# Patient Record
Sex: Female | Born: 1940
Health system: Southern US, Community
[De-identification: ages and names within clinical notes are randomized; demographics above are authoritative.]

## PROBLEM LIST (undated history)

## (undated) DIAGNOSIS — R42 Dizziness and giddiness: Secondary | ICD-10-CM

## (undated) DIAGNOSIS — K219 Gastro-esophageal reflux disease without esophagitis: Secondary | ICD-10-CM

## (undated) DIAGNOSIS — E049 Nontoxic goiter, unspecified: Secondary | ICD-10-CM

## (undated) DIAGNOSIS — T56891A Toxic effect of other metals, accidental (unintentional), initial encounter: Secondary | ICD-10-CM

## (undated) DIAGNOSIS — E785 Hyperlipidemia, unspecified: Secondary | ICD-10-CM

## (undated) DIAGNOSIS — N182 Chronic kidney disease, stage 2 (mild): Secondary | ICD-10-CM

## (undated) DIAGNOSIS — K469 Unspecified abdominal hernia without obstruction or gangrene: Secondary | ICD-10-CM

## (undated) DIAGNOSIS — E039 Hypothyroidism, unspecified: Secondary | ICD-10-CM

## (undated) DIAGNOSIS — Z86010 Personal history of colonic polyps: Secondary | ICD-10-CM

## (undated) DIAGNOSIS — Q211 Atrial septal defect: Secondary | ICD-10-CM

## (undated) DIAGNOSIS — N390 Urinary tract infection, site not specified: Secondary | ICD-10-CM

## (undated) DIAGNOSIS — E038 Other specified hypothyroidism: Secondary | ICD-10-CM

## (undated) DIAGNOSIS — M1990 Unspecified osteoarthritis, unspecified site: Secondary | ICD-10-CM

## (undated) DIAGNOSIS — I10 Essential (primary) hypertension: Secondary | ICD-10-CM

## (undated) DIAGNOSIS — H269 Unspecified cataract: Secondary | ICD-10-CM

## (undated) DIAGNOSIS — K529 Noninfective gastroenteritis and colitis, unspecified: Secondary | ICD-10-CM

## (undated) DIAGNOSIS — F419 Anxiety disorder, unspecified: Secondary | ICD-10-CM

## (undated) DIAGNOSIS — M4306 Spondylolysis, lumbar region: Secondary | ICD-10-CM

## (undated) DIAGNOSIS — G5603 Carpal tunnel syndrome, bilateral upper limbs: Secondary | ICD-10-CM

## (undated) DIAGNOSIS — F329 Major depressive disorder, single episode, unspecified: Secondary | ICD-10-CM

## (undated) DIAGNOSIS — M810 Age-related osteoporosis without current pathological fracture: Secondary | ICD-10-CM

## (undated) DIAGNOSIS — N183 Chronic kidney disease, stage 3 unspecified: Secondary | ICD-10-CM

## (undated) DIAGNOSIS — Q2112 Patent foramen ovale: Secondary | ICD-10-CM

## (undated) DIAGNOSIS — G459 Transient cerebral ischemic attack, unspecified: Secondary | ICD-10-CM

## (undated) DIAGNOSIS — M5136 Other intervertebral disc degeneration, lumbar region: Secondary | ICD-10-CM

## (undated) DIAGNOSIS — F32A Depression, unspecified: Secondary | ICD-10-CM

## (undated) DIAGNOSIS — E559 Vitamin D deficiency, unspecified: Secondary | ICD-10-CM

## (undated) HISTORY — PX: THYROIDECTOMY: SHX17

## (undated) HISTORY — PX: DILATION AND CURETTAGE OF UTERUS: SHX78

## (undated) HISTORY — PX: ABDOMINAL HYSTERECTOMY: SHX81

## (undated) HISTORY — DX: Toxic effect of other metals, accidental (unintentional), initial encounter: T56.891A

## (undated) HISTORY — DX: Hypercalcemia: E83.52

## (undated) HISTORY — PX: BREAST SURGERY: SHX581

## (undated) HISTORY — DX: Other specified hypothyroidism: E03.8

## (undated) HISTORY — DX: Personal history of colonic polyps: Z86.010

## (undated) HISTORY — DX: Spondylolysis, lumbar region: M43.06

## (undated) HISTORY — DX: Other intervertebral disc degeneration, lumbar region: M51.36

## (undated) HISTORY — PX: RECTOCELE REPAIR: SHX761

## (undated) HISTORY — DX: Nontoxic goiter, unspecified: E04.9

## (undated) HISTORY — PX: BREAST EXCISIONAL BIOPSY: SUR124

## (undated) HISTORY — DX: Chronic kidney disease, stage 3 unspecified: N18.30

## (undated) HISTORY — DX: Major depressive disorder, single episode, unspecified: F32.9

## (undated) HISTORY — DX: Depression, unspecified: F32.A

## (undated) HISTORY — DX: Essential (primary) hypertension: I10

## (undated) HISTORY — DX: Dizziness and giddiness: R42

## (undated) HISTORY — DX: Transient cerebral ischemic attack, unspecified: G45.9

## (undated) HISTORY — DX: Chronic kidney disease, stage 2 (mild): N18.2

## (undated) HISTORY — DX: Hyperlipidemia, unspecified: E78.5

## (undated) HISTORY — DX: Unspecified osteoarthritis, unspecified site: M19.90

## (undated) HISTORY — DX: Carpal tunnel syndrome, bilateral upper limbs: G56.03

## (undated) HISTORY — PX: COLONOSCOPY W/ POLYPECTOMY: SHX1380

## (undated) HISTORY — DX: Patent foramen ovale: Q21.12

## (undated) HISTORY — PX: BLADDER SUSPENSION: SHX72

## (undated) HISTORY — DX: Anxiety disorder, unspecified: F41.9

## (undated) HISTORY — DX: Unspecified abdominal hernia without obstruction or gangrene: K46.9

## (undated) HISTORY — DX: Hypothyroidism, unspecified: E03.9

## (undated) HISTORY — DX: Vitamin D deficiency, unspecified: E55.9

## (undated) HISTORY — DX: Age-related osteoporosis without current pathological fracture: M81.0

## (undated) HISTORY — DX: Atrial septal defect: Q21.1

---
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
## 2010-08-14 DIAGNOSIS — Z86010 Personal history of colonic polyps: Secondary | ICD-10-CM

## 2010-08-14 DIAGNOSIS — Z860101 Personal history of adenomatous and serrated colon polyps: Secondary | ICD-10-CM

## 2010-08-14 HISTORY — DX: Personal history of adenomatous and serrated colon polyps: Z86.0101

## 2010-08-14 HISTORY — DX: Personal history of colonic polyps: Z86.010

## 2011-02-03 ENCOUNTER — Emergency Department (INDEPENDENT_AMBULATORY_CARE_PROVIDER_SITE_OTHER): Payer: Medicare Other

## 2011-02-03 ENCOUNTER — Emergency Department (HOSPITAL_BASED_OUTPATIENT_CLINIC_OR_DEPARTMENT_OTHER)
Admission: EM | Admit: 2011-02-03 | Discharge: 2011-02-03 | Disposition: A | Discharge status: Home / Self Care | Payer: Medicare Other | Attending: Emergency Medicine | Admitting: Emergency Medicine

## 2011-02-03 DIAGNOSIS — I6789 Other cerebrovascular disease: Secondary | ICD-10-CM

## 2011-02-03 DIAGNOSIS — R42 Dizziness and giddiness: Secondary | ICD-10-CM | POA: Insufficient documentation

## 2011-02-03 DIAGNOSIS — I10 Essential (primary) hypertension: Secondary | ICD-10-CM

## 2011-02-03 DIAGNOSIS — G319 Degenerative disease of nervous system, unspecified: Secondary | ICD-10-CM

## 2011-02-03 DIAGNOSIS — Z79899 Other long term (current) drug therapy: Secondary | ICD-10-CM | POA: Insufficient documentation

## 2011-02-03 DIAGNOSIS — E7800 Pure hypercholesterolemia, unspecified: Secondary | ICD-10-CM | POA: Insufficient documentation

## 2011-02-03 DIAGNOSIS — H8110 Benign paroxysmal vertigo, unspecified ear: Secondary | ICD-10-CM | POA: Insufficient documentation

## 2011-02-03 LAB — COMPREHENSIVE METABOLIC PANEL
ALT: 22 U/L (ref 0–35)
AST: 20 U/L (ref 0–37)
CO2: 25 mEq/L (ref 19–32)
Calcium: 9.9 mg/dL (ref 8.4–10.5)
Creatinine, Ser: 0.8 mg/dL (ref 0.50–1.10)
GFR calc Af Amer: >60 mL/min (ref >60)
GFR calc non Af Amer: >60 mL/min (ref >60)
Sodium: 140 mEq/L (ref 135–145)
Total Protein: 7 g/dL (ref 6.0–8.3)

## 2011-02-03 LAB — URINALYSIS, ROUTINE W REFLEX MICROSCOPIC
Bilirubin Urine: NEGATIVE
Glucose, UA: NEGATIVE mg/dL
Hgb urine dipstick: NEGATIVE
Ketones, ur: NEGATIVE mg/dL
Protein, ur: NEGATIVE mg/dL
Urobilinogen, UA: 0.2 mg/dL (ref 0.0–1.0)

## 2011-02-03 LAB — DIFFERENTIAL
Basophils Relative: 0 % (ref 0–1)
Eosinophils Absolute: 0.2 10*3/uL (ref 0.0–0.7)
Eosinophils Relative: 2 % (ref 0–5)
Lymphs Abs: 1.5 10*3/uL (ref 0.7–4.0)
Monocytes Absolute: 0.5 10*3/uL (ref 0.1–1.0)
Monocytes Relative: 8 % (ref 3–12)
Neutrophils Relative %: 68 % (ref 43–77)

## 2011-02-03 LAB — CBC
MCH: 29.2 pg (ref 26.0–34.0)
MCHC: 33.6 g/dL (ref 30.0–36.0)
MCV: 87 fL (ref 78.0–100.0)
Platelets: 229 10*3/uL (ref 150–400)
RBC: 4.69 MIL/uL (ref 3.87–5.11)
RDW: 13.4 % (ref 11.5–15.5)

## 2011-02-04 LAB — URINE CULTURE
Colony Count: NO GROWTH
Culture  Setup Time: 201206221626

## 2011-02-28 ENCOUNTER — Other Ambulatory Visit (HOSPITAL_BASED_OUTPATIENT_CLINIC_OR_DEPARTMENT_OTHER): Payer: Self-pay | Admitting: Diagnostic Neuroimaging

## 2011-02-28 DIAGNOSIS — R42 Dizziness and giddiness: Secondary | ICD-10-CM

## 2011-03-01 ENCOUNTER — Inpatient Hospital Stay (HOSPITAL_BASED_OUTPATIENT_CLINIC_OR_DEPARTMENT_OTHER): Admission: RE | Admit: 2011-03-01 | Payer: Medicare Other | Source: Ambulatory Visit

## 2011-03-01 ENCOUNTER — Ambulatory Visit (HOSPITAL_BASED_OUTPATIENT_CLINIC_OR_DEPARTMENT_OTHER)
Admission: RE | Admit: 2011-03-01 | Discharge: 2011-03-01 | Disposition: A | Discharge status: Home / Self Care | Payer: Medicare Other | Source: Ambulatory Visit | Attending: Diagnostic Neuroimaging | Admitting: Diagnostic Neuroimaging

## 2011-03-01 DIAGNOSIS — I679 Cerebrovascular disease, unspecified: Secondary | ICD-10-CM | POA: Insufficient documentation

## 2011-03-01 DIAGNOSIS — R42 Dizziness and giddiness: Secondary | ICD-10-CM

## 2011-03-01 MED ORDER — GADOBENATE DIMEGLUMINE 529 MG/ML IV SOLN
14.0000 mL | Freq: Once | INTRAVENOUS | Status: AC | PRN
  Administered 2011-03-01: 14 mL via INTRAVENOUS

## 2011-03-17 ENCOUNTER — Encounter: Payer: Self-pay | Admitting: Internal Medicine

## 2011-03-17 ENCOUNTER — Ambulatory Visit (HOSPITAL_BASED_OUTPATIENT_CLINIC_OR_DEPARTMENT_OTHER)
Admission: RE | Admit: 2011-03-17 | Discharge: 2011-03-17 | Disposition: A | Discharge status: Home / Self Care | Payer: Medicare Other | Source: Ambulatory Visit | Attending: Internal Medicine | Admitting: Internal Medicine

## 2011-03-17 ENCOUNTER — Ambulatory Visit (INDEPENDENT_AMBULATORY_CARE_PROVIDER_SITE_OTHER): Payer: Medicare Other | Admitting: Internal Medicine

## 2011-03-17 DIAGNOSIS — E039 Hypothyroidism, unspecified: Secondary | ICD-10-CM

## 2011-03-17 DIAGNOSIS — Q2111 Secundum atrial septal defect: Secondary | ICD-10-CM

## 2011-03-17 DIAGNOSIS — E785 Hyperlipidemia, unspecified: Secondary | ICD-10-CM

## 2011-03-17 DIAGNOSIS — Q211 Atrial septal defect: Secondary | ICD-10-CM

## 2011-03-17 DIAGNOSIS — M810 Age-related osteoporosis without current pathological fracture: Secondary | ICD-10-CM

## 2011-03-17 DIAGNOSIS — Z1211 Encounter for screening for malignant neoplasm of colon: Secondary | ICD-10-CM

## 2011-03-17 DIAGNOSIS — E079 Disorder of thyroid, unspecified: Secondary | ICD-10-CM

## 2011-03-17 DIAGNOSIS — E041 Nontoxic single thyroid nodule: Secondary | ICD-10-CM | POA: Insufficient documentation

## 2011-03-17 LAB — LIPID PANEL
Cholesterol: 227 mg/dL — ABNORMAL HIGH (ref 0–200)
HDL: 55 mg/dL (ref >39)
Total CHOL/HDL Ratio: 4.1 Ratio
VLDL: 37 mg/dL (ref 0–40)

## 2011-03-17 NOTE — Patient Instructions (Signed)
Please schedule BMD ([id number])

## 2011-03-18 LAB — T4, FREE: Free T4: 1.05 ng/dL (ref 0.80–1.80)

## 2011-03-19 DIAGNOSIS — E785 Hyperlipidemia, unspecified: Secondary | ICD-10-CM | POA: Insufficient documentation

## 2011-03-19 DIAGNOSIS — Q211 Atrial septal defect: Secondary | ICD-10-CM | POA: Insufficient documentation

## 2011-03-19 DIAGNOSIS — E049 Nontoxic goiter, unspecified: Secondary | ICD-10-CM | POA: Insufficient documentation

## 2011-03-19 DIAGNOSIS — M810 Age-related osteoporosis without current pathological fracture: Secondary | ICD-10-CM | POA: Insufficient documentation

## 2011-03-19 NOTE — Assessment & Plan Note (Signed)
Schedule f/u bmd

## 2011-03-19 NOTE — Progress Notes (Signed)
  Subjective:    Patient ID: Carla Spencer, female    DOB: 1941-02-22, 70 y.o.   MRN: [id number]  HPI Pt presents to clinic to est care and for evaluation of multiple medical problems. H/o decreased memory, word finding and ?white matter lesions noted on cranial imaging. Currently seeing neurology for this. H/o thyroidectomy s/p +PET scan raised concern for thyroid malignancy. States pathology ultimately benign. C/o alopecia. Believes last colonoscopy ~15 years ago and is without change in bowel habit or blood in stool. Was told in 2006 had "hole in heart" and describes undergoing TEE. There was discussion about possible repair but ultimately advised against at the time. Do not believe she has had any further cardiac followup or studies. Denies dyspnea, cp or palpitations. H/o ?osteoporosis without recent fx taking medication. Last bmd >2years ago. Tolerates statin tx without myalgias or abn lfts. Seen recently in ED with vertigo, given antivert and sx's resolved. No other complaints.   Reviewed pmh, psh, medications, allergies, soc hx and fam hx.    Review of Systems  Constitutional: Negative for fatigue.  Respiratory: Negative for shortness of breath.   Cardiovascular: Negative for chest pain and palpitations.  Neurological: Negative for dizziness and weakness.  All other systems reviewed and are negative.       Objective:   Physical Exam  Nursing note and vitals reviewed. Constitutional: She appears well-developed and well-nourished. No distress.  HENT:  Head: Normocephalic and atraumatic.  Right Ear: External ear normal.  Left Ear: External ear normal.  Nose: Nose normal.  Mouth/Throat: Oropharynx is clear and moist. No oropharyngeal exudate.  Eyes: Conjunctivae and EOM are normal. Pupils are equal, round, and reactive to light. Right eye exhibits no discharge. Left eye exhibits no discharge. No scleral icterus.  Neck: Neck supple. No thyromegaly present.  Cardiovascular: Normal  rate, regular rhythm and normal heart sounds.  Exam reveals no gallop and no friction rub.   No murmur heard. Pulmonary/Chest: Effort normal and breath sounds normal. No respiratory distress. She has no wheezes. She has no rales.  Lymphadenopathy:    She has no cervical adenopathy.  Neurological: She is alert.  Skin: Skin is warm and dry. She is not diaphoretic.  Psychiatric: She has a normal mood and affect.          Assessment & Plan:

## 2011-03-19 NOTE — Assessment & Plan Note (Signed)
Obtain lipid/lft. 

## 2011-03-19 NOTE — Assessment & Plan Note (Signed)
?  asd by hx. Cardiology referral for re-evaluation

## 2011-03-19 NOTE — Assessment & Plan Note (Signed)
S/p thyroidectomy. +alopecia. Obtain tsh and free t4

## 2011-03-20 ENCOUNTER — Encounter: Payer: Self-pay | Admitting: Gastroenterology

## 2011-03-31 ENCOUNTER — Encounter: Payer: Self-pay | Admitting: Internal Medicine

## 2011-04-03 ENCOUNTER — Encounter: Payer: Self-pay | Admitting: Internal Medicine

## 2011-04-03 ENCOUNTER — Ambulatory Visit (INDEPENDENT_AMBULATORY_CARE_PROVIDER_SITE_OTHER): Payer: Medicare Other | Admitting: Internal Medicine

## 2011-04-03 DIAGNOSIS — Q211 Atrial septal defect: Secondary | ICD-10-CM

## 2011-04-03 DIAGNOSIS — E785 Hyperlipidemia, unspecified: Secondary | ICD-10-CM

## 2011-04-03 DIAGNOSIS — I119 Hypertensive heart disease without heart failure: Secondary | ICD-10-CM

## 2011-04-03 DIAGNOSIS — I10 Essential (primary) hypertension: Secondary | ICD-10-CM

## 2011-04-03 DIAGNOSIS — E782 Mixed hyperlipidemia: Secondary | ICD-10-CM

## 2011-04-03 MED ORDER — LOSARTAN POTASSIUM 100 MG PO TABS: 100.0000 mg | ORAL_TABLET | Freq: Every day | ORAL | Status: DC

## 2011-04-03 MED ORDER — ATORVASTATIN CALCIUM 20 MG PO TABS: 20.0000 mg | ORAL_TABLET | Freq: Every day | ORAL | Status: DC

## 2011-04-03 NOTE — Patient Instructions (Addendum)
Stop Lisinopril. Start Cozaar 100mg  every day    [facility] [address] Increase Lipitor to 20mg  every day     [facility] [employer]

## 2011-04-04 DIAGNOSIS — I10 Essential (primary) hypertension: Secondary | ICD-10-CM | POA: Insufficient documentation

## 2011-04-04 NOTE — Assessment & Plan Note (Signed)
I would, with family history and MRI findings.  I would recomm tighter control of LDL   WIll increase Lipitor to 20.  ZF/u lipids in 8 wks.

## 2011-04-04 NOTE — Progress Notes (Signed)
HPI Patient is a 70 year old who was referred for evaluation of a hole in her heart. The patieht previously lived in [place].   In 2006 she was evaluated for memory problems. (Dr. Melina Modena). 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  Discussion for closure lead to decision to continue medical Rx.   She was placed on plavix She recently moved here   She has been seen at [facility].  MRI was done that showed small vessel changes. Not further discussion for closure. She denies chest pains.  No dizziness.  NO SOB>  She does not exercise regularly. Allergies  Allergen Reactions  . Sulfa Drugs Cross Reactors Rash    Current Outpatient Prescriptions  Medication Sig Dispense Refill  . alendronate (FOSAMAX) 70 MG tablet Take 70 mg by mouth every 7 (seven) days. Take with a full glass of water on an empty stomach.       . Calcium-Magnesium-Zinc 333-133-5 MG TABS Take by mouth. Take 2 tablets by mouth once daily       . clopidogrel (PLAVIX) 75 MG tablet Take 75 mg by mouth daily.        Marland Kitchen lisinopril (PRINIVIL,ZESTRIL) 40 MG tablet Take 40 mg by mouth daily.        . meclizine (ANTIVERT) 25 MG tablet Take 1 tablet by mouth as needed.      . Omega-3 Fatty Acids (FISH OIL) 1200 MG CAPS Take 1,200 mg by mouth daily.        Marland Kitchen venlafaxine (EFFEXOR-XR) 37.5 MG 24 hr capsule Take 37.5 mg by mouth daily.        Marland Kitchen atorvastatin (LIPITOR) 20 MG tablet Take 1 tablet (20 mg total) by mouth daily.  90 tablet  3  . losartan (COZAAR) 100 MG tablet Take 1 tablet (100 mg total) by mouth daily.  30 tablet  3    Past Medical History  Diagnosis Date  . Vertigo     No past surgical history on file.  No family history on file.  History   Social History  . Marital Status: Married    Spouse Name: N/A    Number of Children: N/A  . Years of Education: N/A   Occupational History  . Not on file.   Social History Main Topics  . Smoking status: Former Games developer  . Smokeless  tobacco: Not on file   Comment: smoked 1/4 of pack from 1961-1967  . Alcohol Use: No  . Drug Use: No  . Sexually Active: Not on file   Other Topics Concern  . Not on file   Social History Narrative  . No narrative on file    Review of Systems:  All systems reviewed.  They are negative to the above problem except as previously stated.  Vital Signs: BP 139/88  Pulse 79  Ht 5\' 2"  (1.575 m)  Wt 158 lb (71.668 kg)  BMI 28.90 kg/m2  Physical Exam  Patient is in NAD  HEENT:  Normocephalic, atraumatic. EOMI, PERRLA.  Neck: JVP is normal. No thyromegaly. No bruits.  Lungs: clear to auscultation. No rales no wheezes.  Heart: Regular rate and rhythm. Normal S1, S2. No S3.   No significant murmurs. PMI not displaced.  Abdomen:  Supple, nontender. Normal bowel sounds. No masses. No hepatomegaly.  Extremities:   Good distal pulses throughout. No lower extremity edema.  Musculoskeletal :moving all extremities.  Neuro:   alert and oriented x3.  CN II-XII grossly  intact.  EKG:  NSR.  64 bpm.  Assessment and Plan:

## 2011-04-04 NOTE — Assessment & Plan Note (Signed)
BP is a little high today.  It was lower on last check She has had a cough for several years.   I would switch her to Cozaar 100 and follow.

## 2011-04-04 NOTE — Assessment & Plan Note (Addendum)
I have none of the outside records.  It sounds like she has a small PFO.   MRI supports no embolic events. Exam does not suggest any larger defect.  Cardiac exam is normal. I would keep her on the same regimen though consider switch to aspirin. Will call her once I have reviewed outside records.

## 2011-04-10 ENCOUNTER — Telehealth: Payer: Self-pay | Admitting: Internal Medicine

## 2011-04-10 ENCOUNTER — Other Ambulatory Visit: Payer: Self-pay | Admitting: Internal Medicine

## 2011-04-10 DIAGNOSIS — E042 Nontoxic multinodular goiter: Secondary | ICD-10-CM

## 2011-04-10 NOTE — Telephone Encounter (Signed)
Patient called wanting referral to Endocrinologist,

## 2011-04-10 NOTE — Telephone Encounter (Signed)
Order placed

## 2011-04-11 ENCOUNTER — Telehealth: Payer: Self-pay | Admitting: Internal Medicine

## 2011-04-11 MED ORDER — VENLAFAXINE HCL ER 37.5 MG PO CP24: 37.5000 mg | ORAL_CAPSULE | Freq: Every day | ORAL | Status: DC

## 2011-04-11 NOTE — Telephone Encounter (Signed)
Rx refill sent to pharmacy. 

## 2011-04-11 NOTE — Telephone Encounter (Signed)
Call from patient needs her   Effexor-Xr 37.14m  To [facility] script  Pt's phone #  [phone]

## 2011-04-11 NOTE — Telephone Encounter (Signed)
Call placed to patient at [phone], she was informed of referral and was advised to call back in one week if she has not heard from office. Patient has verbalized understanding and agrees.

## 2011-04-19 ENCOUNTER — Ambulatory Visit (INDEPENDENT_AMBULATORY_CARE_PROVIDER_SITE_OTHER): Payer: Medicare Other | Admitting: Gastroenterology

## 2011-04-19 ENCOUNTER — Encounter: Payer: Self-pay | Admitting: Gastroenterology

## 2011-04-19 VITALS — BP 110/80 | HR 68 | Ht 61.5 in | Wt 156.8 lb

## 2011-04-19 DIAGNOSIS — Z1211 Encounter for screening for malignant neoplasm of colon: Secondary | ICD-10-CM

## 2011-04-19 DIAGNOSIS — G459 Transient cerebral ischemic attack, unspecified: Secondary | ICD-10-CM

## 2011-04-19 MED ORDER — NA SULFATE-K SULFATE-MG SULF 17.5-3.13-1.6 GM/177ML PO SOLN: 1.0000 | Freq: Every day | ORAL | Status: DC

## 2011-04-19 NOTE — Progress Notes (Signed)
History of Present Illness: This is a 70 year old female who relates long-term difficulties with evacuating stools. She states she has to apply pressure to her rectal area and buttocks and readjust her position to allow bowel movements to pass. She has had a hysterectomy and cystocele repair previously. She has been told of a rectocele. The symptoms have not changed for years. She has no new gastrointestinal complaints. 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 .Denies weight loss, abdominal pain, constipation, diarrhea, change in stool caliber, melena, hematochezia, nausea, vomiting, dysphagia, reflux symptoms, chest pain. She states she had colonoscopies done previously in [place] she states her last colonoscopy was over 15 years ago. Did not recall any findings on her colonoscopies.  Past Medical History  Diagnosis Date  . Vertigo   . Anxiety and depression   . Hyperlipemia   . Hypertension   . TIA (transient ischemic attack)   . Arthritis    Past Surgical History  Procedure Date  . Abdominal hysterectomy   . Bladder suspension   . Purse string     Rectum  . Thyroidectomy     Left  . Rectocele repair     reports that she has quit smoking. She has never used smokeless tobacco. She reports that she does not drink alcohol or use illicit drugs. family history includes Colon polyps in her daughter; Heart disease in her mother; and Stomach cancer in her father. Allergies  Allergen Reactions  . Sulfa Drugs Cross Reactors Rash   Outpatient Encounter Prescriptions as of 04/19/2011  Medication Sig Dispense Refill  . alendronate (FOSAMAX) 70 MG tablet Take 70 mg by mouth every 7 (seven) days. Take with a full glass of water on an empty stomach.       Marland Kitchen atorvastatin (LIPITOR) 20 MG tablet Take 1 tablet (20 mg total) by mouth daily.  90 tablet  3  . Calcium-Magnesium-Zinc 333-133-5 MG TABS Take by mouth. Take 2 tablets by mouth  once daily       . clopidogrel (PLAVIX) 75 MG tablet Take 75 mg by mouth daily.        Marland Kitchen losartan (COZAAR) 100 MG tablet Take 1 tablet (100 mg total) by mouth daily.  30 tablet  3  . meclizine (ANTIVERT) 25 MG tablet Take 1 tablet by mouth as needed.      . Na Sulfate-K Sulfate-Mg Sulf (SUPREP BOWEL PREP) SOLN Take 1 kit by mouth daily.  177 mL  0  . Omega-3 Fatty Acids (FISH OIL) 1200 MG CAPS Take 1,200 mg by mouth daily.        Marland Kitchen venlafaxine (EFFEXOR-XR) 37.5 MG 24 hr capsule Take 1 capsule (37.5 mg total) by mouth daily.  90 capsule  1  . DISCONTD: lisinopril (PRINIVIL,ZESTRIL) 40 MG tablet Take 40 mg by mouth daily.         ROS: All systems negative except as outlined in history of present illness.  Physical Exam: General: Well developed , well nourished, no acute distress Head: Normocephalic and atraumatic Eyes:  sclerae anicteric, EOMI Ears: Normal auditory acuity Mouth: No deformity or lesions Lungs: Clear throughout to auscultation Heart: Regular rate and rhythm; no murmurs, rubs or bruits Abdomen: Soft, non tender and non distended. No masses, hepatosplenomegaly or hernias noted. Normal Bowel sounds Rectal: Deferred to colonoscopy Musculoskeletal: Symmetrical with no gross deformities  Pulses:  Normal pulses noted Extremities: No clubbing, cyanosis, edema or deformities noted Neurological: Alert oriented x  4, grossly nonfocal Psychological:  Alert and cooperative. Normal mood and affect  Assessment and Recommendations:  1. Colorectal cancer screening. Average risk. The risks, benefits, and alternatives to colonoscopy with possible biopsy and possible polypectomy were discussed with the patient and they consent to proceed.   2. Difficulty evacuating stools. Likely secondary to a rectocele. Further evaluation with colonoscopy. Recommend GYN evaluation following colonoscopy.  3. History of an ASD with TIAs maintained on Plavix. A 5 day hold Plavix is recommended. The risks,  benefits and alternatives were outlined to the patient. Request clearance from Dr. Rodena Medin.

## 2011-04-19 NOTE — Patient Instructions (Signed)
You have been scheduled for a Colonoscopy. See separate instructions. Pick your prep kit from your pharmacy.  You will be contacted by our office prior to your procedure for directions on holding your Plavix.  If you do not hear from our office 1 week prior to your scheduled procedure, please call [phone] to discuss.  cc: Clifton Custard, MD

## 2011-04-28 ENCOUNTER — Telehealth: Payer: Self-pay | Admitting: *Deleted

## 2011-04-28 NOTE — Telephone Encounter (Signed)
Pt aware.

## 2011-04-28 NOTE — Telephone Encounter (Signed)
Message copied by Leonette Monarch on Fri Apr 28, 2011  9:17 AM ------      Message from: Staci Righter.      Created: Thu Apr 27, 2011  1:29 PM       Sure. It's ok to hold plavix for 5 days.       Thanks      W      ----- Message -----         From: Harlow Mares, CMA         Sent: 04/27/2011  10:33 AM           To: [employer].            Dr Rodena Medin can you please advise on holding patients Plavix for her procedure?            Harlow Mares, CMA (AAMA)

## 2011-05-03 ENCOUNTER — Other Ambulatory Visit: Payer: Medicare Other | Admitting: Gastroenterology

## 2011-05-05 ENCOUNTER — Ambulatory Visit (AMBULATORY_SURGERY_CENTER): Payer: Medicare Other | Admitting: Gastroenterology

## 2011-05-05 ENCOUNTER — Encounter: Payer: Self-pay | Admitting: Gastroenterology

## 2011-05-05 VITALS — BP 130/75 | HR 72 | Temp 98.5°F | Resp 20 | Ht 61.0 in | Wt 156.0 lb

## 2011-05-05 DIAGNOSIS — D126 Benign neoplasm of colon, unspecified: Secondary | ICD-10-CM

## 2011-05-05 DIAGNOSIS — Z1211 Encounter for screening for malignant neoplasm of colon: Secondary | ICD-10-CM

## 2011-05-05 MED ORDER — SODIUM CHLORIDE 0.9 % IV SOLN: 500.0000 mL | INTRAVENOUS | Status: DC

## 2011-05-05 NOTE — Patient Instructions (Addendum)
Follow your discharge instructions.  Continue your medications. Restart your Plavix .  High fiber diet with liberal fluid intake.

## 2011-05-08 ENCOUNTER — Telehealth: Payer: Self-pay | Admitting: *Deleted

## 2011-05-08 NOTE — Telephone Encounter (Signed)
Follow up Call- Patient questions:  Do you have a fever, pain , or abdominal swelling? no Pain Score  0 *  Have you tolerated food without any problems? no  Have you been able to return to your normal activities? yes  Do you have any questions about your discharge instructions: Diet   no Medications  no Follow up visit  no  Do you have questions or concerns about your Care? no  Actions: * If pain score is 4 or above: No action needed, pain <4. 

## 2011-05-10 ENCOUNTER — Encounter: Payer: Self-pay | Admitting: Gastroenterology

## 2011-05-17 ENCOUNTER — Encounter: Payer: Self-pay | Admitting: Internal Medicine

## 2011-05-17 ENCOUNTER — Telehealth: Payer: Self-pay | Admitting: Internal Medicine

## 2011-05-17 ENCOUNTER — Ambulatory Visit (INDEPENDENT_AMBULATORY_CARE_PROVIDER_SITE_OTHER): Payer: Medicare Other | Admitting: Internal Medicine

## 2011-05-17 DIAGNOSIS — E079 Disorder of thyroid, unspecified: Secondary | ICD-10-CM

## 2011-05-17 DIAGNOSIS — IMO0002 Reserved for concepts with insufficient information to code with codable children: Secondary | ICD-10-CM

## 2011-05-17 DIAGNOSIS — M810 Age-related osteoporosis without current pathological fracture: Secondary | ICD-10-CM

## 2011-05-17 DIAGNOSIS — E785 Hyperlipidemia, unspecified: Secondary | ICD-10-CM

## 2011-05-17 DIAGNOSIS — N8111 Cystocele, midline: Secondary | ICD-10-CM

## 2011-05-17 DIAGNOSIS — I119 Hypertensive heart disease without heart failure: Secondary | ICD-10-CM

## 2011-05-17 DIAGNOSIS — I10 Essential (primary) hypertension: Secondary | ICD-10-CM

## 2011-05-17 MED ORDER — LOSARTAN POTASSIUM 100 MG PO TABS: 100.0000 mg | ORAL_TABLET | Freq: Every day | ORAL | Status: DC

## 2011-05-17 MED ORDER — AMLODIPINE BESYLATE 2.5 MG PO TABS: 2.5000 mg | ORAL_TABLET | Freq: Every day | ORAL | Status: DC

## 2011-05-17 MED ORDER — VENLAFAXINE HCL ER 37.5 MG PO CP24: 37.5000 mg | ORAL_CAPSULE | Freq: Every day | ORAL | Status: DC

## 2011-05-17 NOTE — Patient Instructions (Signed)
Please schedule fasting lipid profile and LFT (272.4) in approximately 2 weeks. Please schedule bone density appointment at the front desk.

## 2011-05-17 NOTE — Telephone Encounter (Signed)
Pharmacy updated.

## 2011-05-17 NOTE — Telephone Encounter (Signed)
Lab Orders entered for [facility].

## 2011-05-17 NOTE — Telephone Encounter (Signed)
Please send lab orders downstairs. Patient is supposed to return in two weeks.  Lipid and LFT 272.4

## 2011-05-17 NOTE — Telephone Encounter (Signed)
Patient states that she has a new pharmacy- [facility].

## 2011-05-19 ENCOUNTER — Ambulatory Visit (INDEPENDENT_AMBULATORY_CARE_PROVIDER_SITE_OTHER)
Admission: RE | Admit: 2011-05-19 | Discharge: 2011-05-19 | Disposition: A | Discharge status: Home / Self Care | Payer: Medicare Other | Source: Ambulatory Visit

## 2011-05-19 DIAGNOSIS — M810 Age-related osteoporosis without current pathological fracture: Secondary | ICD-10-CM

## 2011-05-21 DIAGNOSIS — I119 Hypertensive heart disease without heart failure: Secondary | ICD-10-CM | POA: Insufficient documentation

## 2011-05-21 DIAGNOSIS — IMO0002 Reserved for concepts with insufficient information to code with codable children: Secondary | ICD-10-CM | POA: Insufficient documentation

## 2011-05-21 NOTE — Assessment & Plan Note (Signed)
Schedule bmd 

## 2011-05-21 NOTE — Assessment & Plan Note (Signed)
Refax thyroid US results to endocrinology for review. Close followup scheduled.

## 2011-05-21 NOTE — Assessment & Plan Note (Signed)
Add low dose norvasc. Monitor bp as outpt and f/u as scheduled for re-evaluation

## 2011-05-21 NOTE — Progress Notes (Signed)
  Subjective:    Patient ID: [name], female    DOB: 1941/07/26, 70 y.o.   MRN: [id number]  HPI Pt presents to clinic for followup of multiple medical problems. S/p endocrinology evaluation and was told TFT's nl. Reviewed thyroid US with pt with right sided solid lesion with calcifications. Continues with difficulty with stools and is now recently s/p colonoscopy without significant finding. S/p hysterectomy with past cystocele repair. BP remains mildly elevated despite increase of cozaar.  Wishes to avoid diuretic tx. Self weaned effexor down to q 3 days. Notes depressed mood without SI. No other complaints.  Past Medical History  Diagnosis Date  . Vertigo   . Anxiety and depression   . Hyperlipemia   . Hypertension   . TIA (transient ischemic attack)   . Arthritis    Past Surgical History  Procedure Date  . Abdominal hysterectomy   . Bladder suspension   . Purse string     Rectum  . Thyroidectomy     Left  . Rectocele repair   . Colonoscopy     reports that she has quit smoking. She has never used smokeless tobacco. She reports that she does not drink alcohol or use illicit drugs. family history includes Colon polyps in her daughter; Heart disease in her mother; and Stomach cancer in her father. Allergies  Allergen Reactions  . Sulfa Drugs Cross Reactors Rash     Review of Systems see hpi     Objective:   Physical Exam  Nursing note and vitals reviewed. Constitutional: She appears well-developed and well-nourished. No distress.  HENT:  Head: Normocephalic and atraumatic.  Neurological: She is alert.  Skin: She is not diaphoretic.  Psychiatric: She has a normal mood and affect.          Assessment & Plan:

## 2011-05-21 NOTE — Assessment & Plan Note (Signed)
Gyn referral. Current provider is retiring.

## 2011-05-23 ENCOUNTER — Encounter: Payer: Self-pay | Admitting: Internal Medicine

## 2011-05-23 HISTORY — PX: OTHER SURGICAL HISTORY: SHX169

## 2011-05-25 ENCOUNTER — Telehealth: Payer: Self-pay | Admitting: *Deleted

## 2011-05-25 ENCOUNTER — Other Ambulatory Visit: Payer: Self-pay | Admitting: Internal Medicine

## 2011-05-25 DIAGNOSIS — E041 Nontoxic single thyroid nodule: Secondary | ICD-10-CM

## 2011-05-25 DIAGNOSIS — M8580 Other specified disorders of bone density and structure, unspecified site: Secondary | ICD-10-CM

## 2011-05-25 NOTE — Telephone Encounter (Signed)
Yes and i will redo referral for endo

## 2011-05-25 NOTE — Telephone Encounter (Signed)
Call was placed to patient to inform her of test results. She has been advised of bone density per Dr Rodena Medin instructions.   Patient asked that Dr Rodena Medin be made aware that she has not heard anything from Dr Alford Highland office since last office visit. She stated she was advised by Dr Rodena Medin if she did not hear anything to let him know. She is scheduled for follow up on 05/31/2011 and stated that she is scheduled to see her GYN, and wanted to know if she should move her appointment with Dr Rodena Medin to after she sees the gynecologist.

## 2011-05-25 NOTE — Telephone Encounter (Signed)
Call placed to patient at [phone], she was informed per Dr Rodena Medin that referral would be initiated, and our office would contact her within the week. Patient has moved appointment from 05/31/2011 to 07/04/2011.

## 2011-05-31 ENCOUNTER — Ambulatory Visit: Payer: Medicare Other | Admitting: Internal Medicine

## 2011-06-05 ENCOUNTER — Other Ambulatory Visit: Payer: Self-pay | Admitting: Endocrinology

## 2011-06-05 DIAGNOSIS — E041 Nontoxic single thyroid nodule: Secondary | ICD-10-CM

## 2011-06-06 ENCOUNTER — Telehealth: Payer: Self-pay | Admitting: *Deleted

## 2011-06-06 ENCOUNTER — Other Ambulatory Visit: Payer: Self-pay | Admitting: Endocrinology

## 2011-06-06 ENCOUNTER — Telehealth: Payer: Self-pay | Admitting: Emergency Medicine

## 2011-06-06 DIAGNOSIS — E041 Nontoxic single thyroid nodule: Secondary | ICD-10-CM

## 2011-06-06 NOTE — Telephone Encounter (Signed)
Call placed to  [facility] at [phone], no answer, voice recording reached. A detailed voice message was left informing of approval for patient to stop Plavix 5 days  prior to biopsy.  Call placed to patient [phone], no answer. A detailed voice message was left informing patient that it was ok to stop Plavix 5 days prior to thyroid biopsy. Message was left for patient to call back if any questions.

## 2011-06-06 NOTE — Telephone Encounter (Signed)
DARLENE LM ON VM TO MAKE [place] AWARE THAT PT. WILL BE ABLE TO STOP PLAVIX X5D PRIOR TO BX. OK PER DR THOMAS HODGIN.

## 2011-06-06 NOTE — Telephone Encounter (Signed)
Call received from [facility] stating patient was referred for thyroid biopsy, and was informed by patient that Dr Rodena Medin manages her Plavix. They would like for patient to stop taking Plavix 5 days prior to biopsy, and would to know if Dr Rodena Medin is okay with this.

## 2011-06-06 NOTE — Telephone Encounter (Signed)
ok 

## 2011-06-21 ENCOUNTER — Other Ambulatory Visit (HOSPITAL_COMMUNITY)
Admission: RE | Admit: 2011-06-21 | Discharge: 2011-06-21 | Disposition: A | Discharge status: Home / Self Care | Payer: Medicare Other | Source: Ambulatory Visit | Attending: Diagnostic Radiology | Admitting: Diagnostic Radiology

## 2011-06-21 ENCOUNTER — Ambulatory Visit
Admission: RE | Admit: 2011-06-21 | Discharge: 2011-06-21 | Disposition: A | Discharge status: Home / Self Care | Payer: Medicare Other | Source: Ambulatory Visit | Attending: Endocrinology | Admitting: Endocrinology

## 2011-06-21 DIAGNOSIS — E049 Nontoxic goiter, unspecified: Secondary | ICD-10-CM | POA: Insufficient documentation

## 2011-06-21 DIAGNOSIS — E041 Nontoxic single thyroid nodule: Secondary | ICD-10-CM

## 2011-07-04 ENCOUNTER — Ambulatory Visit (INDEPENDENT_AMBULATORY_CARE_PROVIDER_SITE_OTHER): Payer: Medicare Other | Admitting: Internal Medicine

## 2011-07-04 ENCOUNTER — Encounter: Payer: Self-pay | Admitting: Internal Medicine

## 2011-07-04 ENCOUNTER — Telehealth: Payer: Self-pay | Admitting: Internal Medicine

## 2011-07-04 VITALS — BP 130/80 | HR 58 | Temp 97.9°F | Resp 16 | Wt 158.0 lb

## 2011-07-04 DIAGNOSIS — Z2911 Encounter for prophylactic immunotherapy for respiratory syncytial virus (RSV): Secondary | ICD-10-CM

## 2011-07-04 DIAGNOSIS — Z1239 Encounter for other screening for malignant neoplasm of breast: Secondary | ICD-10-CM

## 2011-07-04 DIAGNOSIS — I119 Hypertensive heart disease without heart failure: Secondary | ICD-10-CM

## 2011-07-04 DIAGNOSIS — I10 Essential (primary) hypertension: Secondary | ICD-10-CM

## 2011-07-04 DIAGNOSIS — Z23 Encounter for immunization: Secondary | ICD-10-CM

## 2011-07-04 DIAGNOSIS — Z79899 Other long term (current) drug therapy: Secondary | ICD-10-CM

## 2011-07-04 DIAGNOSIS — E079 Disorder of thyroid, unspecified: Secondary | ICD-10-CM

## 2011-07-04 DIAGNOSIS — E049 Nontoxic goiter, unspecified: Secondary | ICD-10-CM

## 2011-07-04 DIAGNOSIS — F3289 Other specified depressive episodes: Secondary | ICD-10-CM

## 2011-07-04 DIAGNOSIS — E785 Hyperlipidemia, unspecified: Secondary | ICD-10-CM

## 2011-07-04 DIAGNOSIS — M8580 Other specified disorders of bone density and structure, unspecified site: Secondary | ICD-10-CM

## 2011-07-04 DIAGNOSIS — F329 Major depressive disorder, single episode, unspecified: Secondary | ICD-10-CM

## 2011-07-04 MED ORDER — PAROXETINE HCL 20 MG PO TABS: 20.0000 mg | ORAL_TABLET | ORAL | Status: DC

## 2011-07-04 MED ORDER — LOSARTAN POTASSIUM 100 MG PO TABS: 100.0000 mg | ORAL_TABLET | Freq: Every day | ORAL | Status: DC

## 2011-07-04 MED ORDER — AMLODIPINE BESYLATE 2.5 MG PO TABS: 2.5000 mg | ORAL_TABLET | Freq: Every day | ORAL | Status: DC

## 2011-07-04 MED ORDER — CLOPIDOGREL BISULFATE 75 MG PO TABS: 75.0000 mg | ORAL_TABLET | Freq: Every day | ORAL | Status: DC

## 2011-07-04 NOTE — Patient Instructions (Signed)
Please schedule cbc, chem7 (v58.69) and lipid/lft (272.4) prior to next visit 

## 2011-07-04 NOTE — Telephone Encounter (Signed)
Please order labs for one week prior to 11-06-11 visit. Patient will be going to [facility] lab  Cbc, chem7 v58.69 Lipid/lft 272.4

## 2011-07-04 NOTE — Telephone Encounter (Signed)
Future lab entered for March 2013.

## 2011-07-07 ENCOUNTER — Ambulatory Visit (HOSPITAL_BASED_OUTPATIENT_CLINIC_OR_DEPARTMENT_OTHER): Payer: Medicare Other

## 2011-07-09 NOTE — Progress Notes (Signed)
  Subjective:    Patient ID: [name], female    DOB: 1941-01-25, 70 y.o.   MRN: [id number]  HPI Pt presents to clinic for followup of multiple medical problems. Reviewed evaluation of thyroid mass with recent bx. bx #1 suggested non neoplastic goiter and #2 was indeterminate with nonspecific atypi. Scheduled for close periodic follow up by endocrinology. H/o mild depression and states previously took paxil which helped more than effexor. BP reviewed normotensive and tolerating norvasc without difficulty. No other complaints.  Past Medical History  Diagnosis Date  . Vertigo   . Anxiety and depression   . Hyperlipemia   . Hypertension   . TIA (transient ischemic attack)   . Arthritis    Past Surgical History  Procedure Date  . Abdominal hysterectomy   . Bladder suspension   . Purse string     Rectum  . Thyroidectomy     Left  . Rectocele repair   . Colonoscopy     reports that she has quit smoking. She has never used smokeless tobacco. She reports that she does not drink alcohol or use illicit drugs. family history includes Colon polyps in her daughter; Heart disease in her mother; and Stomach cancer in her father. Allergies  Allergen Reactions  . Sulfa Drugs Cross Reactors Rash     Review of Systems see hpi     Objective:   Physical Exam  Nursing note and vitals reviewed. Constitutional: She appears well-developed and well-nourished.          Assessment & Plan:

## 2011-07-09 NOTE — Assessment & Plan Note (Signed)
Change effexor to paxil.

## 2011-07-09 NOTE — Assessment & Plan Note (Signed)
Normotensive and stable. Continue current regimen. Monitor bp as outpt and followup in clinic as scheduled.  

## 2011-07-09 NOTE — Assessment & Plan Note (Signed)
S/p bx-indeterminate. scheuduled for close periodic f/u with endocrinology.

## 2011-07-24 ENCOUNTER — Ambulatory Visit (HOSPITAL_BASED_OUTPATIENT_CLINIC_OR_DEPARTMENT_OTHER)
Admission: RE | Admit: 2011-07-24 | Discharge: 2011-07-24 | Disposition: A | Discharge status: Home / Self Care | Payer: Medicare Other | Source: Ambulatory Visit | Attending: Internal Medicine | Admitting: Internal Medicine

## 2011-07-24 DIAGNOSIS — Z1231 Encounter for screening mammogram for malignant neoplasm of breast: Secondary | ICD-10-CM

## 2011-07-24 DIAGNOSIS — Z1239 Encounter for other screening for malignant neoplasm of breast: Secondary | ICD-10-CM

## 2011-08-21 ENCOUNTER — Other Ambulatory Visit: Payer: Self-pay | Admitting: Endocrinology

## 2011-08-21 DIAGNOSIS — E041 Nontoxic single thyroid nodule: Secondary | ICD-10-CM

## 2011-08-22 DIAGNOSIS — N3946 Mixed incontinence: Secondary | ICD-10-CM | POA: Diagnosis not present

## 2011-08-25 DIAGNOSIS — N8181 Perineocele: Secondary | ICD-10-CM | POA: Diagnosis not present

## 2011-08-25 DIAGNOSIS — K5902 Outlet dysfunction constipation: Secondary | ICD-10-CM | POA: Diagnosis not present

## 2011-08-25 DIAGNOSIS — N393 Stress incontinence (female) (male): Secondary | ICD-10-CM | POA: Diagnosis not present

## 2011-08-30 DIAGNOSIS — K469 Unspecified abdominal hernia without obstruction or gangrene: Secondary | ICD-10-CM | POA: Diagnosis not present

## 2011-08-30 DIAGNOSIS — R159 Full incontinence of feces: Secondary | ICD-10-CM | POA: Diagnosis not present

## 2011-08-30 DIAGNOSIS — K5900 Constipation, unspecified: Secondary | ICD-10-CM | POA: Diagnosis not present

## 2011-08-30 DIAGNOSIS — K5902 Outlet dysfunction constipation: Secondary | ICD-10-CM | POA: Diagnosis not present

## 2011-09-29 DIAGNOSIS — N393 Stress incontinence (female) (male): Secondary | ICD-10-CM | POA: Diagnosis not present

## 2011-09-29 DIAGNOSIS — N815 Vaginal enterocele: Secondary | ICD-10-CM | POA: Diagnosis not present

## 2011-10-12 DIAGNOSIS — R159 Full incontinence of feces: Secondary | ICD-10-CM | POA: Diagnosis not present

## 2011-10-12 DIAGNOSIS — M899 Disorder of bone, unspecified: Secondary | ICD-10-CM | POA: Diagnosis not present

## 2011-10-12 DIAGNOSIS — Z01810 Encounter for preprocedural cardiovascular examination: Secondary | ICD-10-CM | POA: Diagnosis not present

## 2011-10-12 DIAGNOSIS — N8181 Perineocele: Secondary | ICD-10-CM | POA: Diagnosis not present

## 2011-10-12 DIAGNOSIS — I10 Essential (primary) hypertension: Secondary | ICD-10-CM | POA: Diagnosis not present

## 2011-10-12 DIAGNOSIS — C73 Malignant neoplasm of thyroid gland: Secondary | ICD-10-CM | POA: Diagnosis not present

## 2011-10-12 DIAGNOSIS — E7800 Pure hypercholesterolemia, unspecified: Secondary | ICD-10-CM | POA: Diagnosis not present

## 2011-10-12 DIAGNOSIS — R32 Unspecified urinary incontinence: Secondary | ICD-10-CM | POA: Diagnosis not present

## 2011-10-12 DIAGNOSIS — Z01818 Encounter for other preprocedural examination: Secondary | ICD-10-CM | POA: Diagnosis not present

## 2011-10-12 DIAGNOSIS — Q249 Congenital malformation of heart, unspecified: Secondary | ICD-10-CM | POA: Diagnosis not present

## 2011-10-12 DIAGNOSIS — E785 Hyperlipidemia, unspecified: Secondary | ICD-10-CM | POA: Diagnosis not present

## 2011-10-12 DIAGNOSIS — F411 Generalized anxiety disorder: Secondary | ICD-10-CM | POA: Diagnosis not present

## 2011-10-12 DIAGNOSIS — I499 Cardiac arrhythmia, unspecified: Secondary | ICD-10-CM | POA: Diagnosis not present

## 2011-10-12 DIAGNOSIS — G459 Transient cerebral ischemic attack, unspecified: Secondary | ICD-10-CM | POA: Diagnosis not present

## 2011-10-12 DIAGNOSIS — N815 Vaginal enterocele: Secondary | ICD-10-CM | POA: Diagnosis not present

## 2011-10-16 ENCOUNTER — Telehealth: Payer: Self-pay | Admitting: Internal Medicine

## 2011-10-16 NOTE — Telephone Encounter (Signed)
Call from [facility]  , patient is scheduled for surgery 10/17/2011  Need surgical clearance form signed before pt can have surgery,  Per called they have been faxing to wrong office,  Office in [place] , please fax back before 5 pm today

## 2011-10-16 NOTE — Telephone Encounter (Signed)
Clearance formed faxed to [phone]

## 2011-10-17 DIAGNOSIS — N816 Rectocele: Secondary | ICD-10-CM | POA: Diagnosis not present

## 2011-10-17 DIAGNOSIS — N393 Stress incontinence (female) (male): Secondary | ICD-10-CM | POA: Diagnosis not present

## 2011-10-17 DIAGNOSIS — N8181 Perineocele: Secondary | ICD-10-CM | POA: Diagnosis not present

## 2011-10-17 DIAGNOSIS — N3946 Mixed incontinence: Secondary | ICD-10-CM | POA: Diagnosis not present

## 2011-10-18 DIAGNOSIS — N816 Rectocele: Secondary | ICD-10-CM | POA: Diagnosis not present

## 2011-10-18 DIAGNOSIS — N8181 Perineocele: Secondary | ICD-10-CM | POA: Diagnosis not present

## 2011-10-18 DIAGNOSIS — N3946 Mixed incontinence: Secondary | ICD-10-CM | POA: Diagnosis not present

## 2011-10-24 ENCOUNTER — Encounter: Payer: Self-pay | Admitting: Internal Medicine

## 2011-10-24 ENCOUNTER — Ambulatory Visit (INDEPENDENT_AMBULATORY_CARE_PROVIDER_SITE_OTHER): Payer: Medicare Other | Admitting: Internal Medicine

## 2011-10-24 DIAGNOSIS — I119 Hypertensive heart disease without heart failure: Secondary | ICD-10-CM | POA: Diagnosis not present

## 2011-10-24 DIAGNOSIS — E782 Mixed hyperlipidemia: Secondary | ICD-10-CM

## 2011-10-24 DIAGNOSIS — I10 Essential (primary) hypertension: Secondary | ICD-10-CM | POA: Diagnosis not present

## 2011-10-24 MED ORDER — AMLODIPINE BESYLATE 2.5 MG PO TABS: 2.5000 mg | ORAL_TABLET | Freq: Every day | ORAL | Status: DC

## 2011-10-24 MED ORDER — ALENDRONATE SODIUM 70 MG PO TABS: 70.0000 mg | ORAL_TABLET | ORAL | Status: DC

## 2011-10-24 MED ORDER — PAROXETINE HCL 20 MG PO TABS: 20.0000 mg | ORAL_TABLET | ORAL | Status: DC

## 2011-10-24 MED ORDER — CLOPIDOGREL BISULFATE 75 MG PO TABS: 75.0000 mg | ORAL_TABLET | Freq: Every day | ORAL | Status: DC

## 2011-10-24 MED ORDER — LOSARTAN POTASSIUM 100 MG PO TABS: 100.0000 mg | ORAL_TABLET | Freq: Every day | ORAL | Status: DC

## 2011-10-24 MED ORDER — ATORVASTATIN CALCIUM 20 MG PO TABS: 20.0000 mg | ORAL_TABLET | Freq: Every day | ORAL | Status: DC

## 2011-10-24 NOTE — Progress Notes (Signed)
  Subjective:    Patient ID: Carla Spencer, female    DOB: [birth year], 71 y.o.   MRN: [id number]  HPI Pt presents to clinic for followup of multiple medical problems. Recently s/p GU surgery. Having less pain and notes bm's improving. Had low bp's prior to dc and some bp medications held. Pt noted increasing bp's recently and resumed cozaar and norvasc. No other complaints. Medications reviewed in detail with pt. Total time of visit ~20 minutes of which >50% spent in counseling.   Past Medical History  Diagnosis Date  . Vertigo   . Anxiety and depression   . Hyperlipemia   . Hypertension   . TIA (transient ischemic attack)   . Arthritis    Past Surgical History  Procedure Date  . Abdominal hysterectomy   . Bladder suspension   . Purse string     Rectum  . Thyroidectomy     Left  . Rectocele repair   . Colonoscopy     reports that she has quit smoking. She has never used smokeless tobacco. She reports that she does not drink alcohol or use illicit drugs. family history includes Colon polyps in her daughter; Heart disease in her mother; and Stomach cancer in her father. Allergies  Allergen Reactions  . Sulfa Drugs Cross Reactors Rash     Review of Systems see hpi     Objective:   Physical Exam  Physical Exam  Nursing note and vitals reviewed. Constitutional: Appears well-developed and well-nourished. No distress.  HENT:  Head: Normocephalic and atraumatic.  Right Ear: External ear normal.  Left Ear: External ear normal.  Eyes: Conjunctivae are normal. No scleral icterus.  Neck: Neck supple. Carotid bruit is not present.  Cardiovascular: Normal rate, regular rhythm and normal heart sounds.  Exam reveals no gallop and no friction rub.   No murmur heard. Pulmonary/Chest: Effort normal and breath sounds normal. No respiratory distress. He has no wheezes. no rales.  Lymphadenopathy:    He has no cervical adenopathy.  Neurological:Alert.  Skin: Skin is warm and dry. Not  diaphoretic.  Psychiatric: Has a normal mood and affect.        Assessment & Plan:

## 2011-10-29 NOTE — Assessment & Plan Note (Signed)
Recommend continuing cozaar and norvasc. Monitor bp as outpt and f/u in clinic as scheduled.

## 2011-10-30 ENCOUNTER — Ambulatory Visit
Admission: RE | Admit: 2011-10-30 | Discharge: 2011-10-30 | Disposition: A | Discharge status: Home / Self Care | Payer: Medicare Other | Source: Ambulatory Visit | Attending: Endocrinology | Admitting: Endocrinology

## 2011-10-30 DIAGNOSIS — Z79899 Other long term (current) drug therapy: Secondary | ICD-10-CM | POA: Diagnosis not present

## 2011-10-30 DIAGNOSIS — E041 Nontoxic single thyroid nodule: Secondary | ICD-10-CM

## 2011-10-30 DIAGNOSIS — M899 Disorder of bone, unspecified: Secondary | ICD-10-CM | POA: Diagnosis not present

## 2011-10-30 DIAGNOSIS — E042 Nontoxic multinodular goiter: Secondary | ICD-10-CM | POA: Diagnosis not present

## 2011-10-30 DIAGNOSIS — E785 Hyperlipidemia, unspecified: Secondary | ICD-10-CM | POA: Diagnosis not present

## 2011-10-30 LAB — CBC
MCHC: 32.3 g/dL (ref 30.0–36.0)
RDW: 13 % (ref 11.5–15.5)
WBC: 8.6 10*3/uL (ref 4.0–10.5)

## 2011-10-30 LAB — HEPATIC FUNCTION PANEL
AST: 12 U/L (ref 0–37)
Alkaline Phosphatase: 67 U/L (ref 39–117)
Indirect Bilirubin: 0.6 mg/dL (ref 0.0–0.9)
Total Bilirubin: 0.7 mg/dL (ref 0.3–1.2)
Total Protein: 6.4 g/dL (ref 6.0–8.3)

## 2011-10-30 NOTE — Telephone Encounter (Signed)
Addended by: Mervin Kung A on: 10/30/2011 08:38 AM   Modules accepted: Orders

## 2011-10-31 LAB — LIPID PANEL
HDL: 45 mg/dL (ref >39)
LDL Cholesterol: 138 mg/dL — ABNORMAL HIGH (ref 0–99)
Total CHOL/HDL Ratio: 4.8 Ratio

## 2011-10-31 LAB — VITAMIN D 25 HYDROXY (VIT D DEFICIENCY, FRACTURES): Vit D, 25-Hydroxy: 35 ng/mL (ref 30–89)

## 2011-10-31 LAB — BASIC METABOLIC PANEL
Chloride: 105 mEq/L (ref 96–112)
Potassium: 4.9 mEq/L (ref 3.5–5.3)
Sodium: 142 mEq/L (ref 135–145)

## 2011-11-06 ENCOUNTER — Encounter: Payer: Self-pay | Admitting: Internal Medicine

## 2011-11-06 ENCOUNTER — Ambulatory Visit (INDEPENDENT_AMBULATORY_CARE_PROVIDER_SITE_OTHER): Payer: Medicare Other | Admitting: Internal Medicine

## 2011-11-06 VITALS — BP 130/80 | HR 69 | Temp 97.9°F | Resp 18 | Ht 61.5 in | Wt 155.0 lb

## 2011-11-06 DIAGNOSIS — E785 Hyperlipidemia, unspecified: Secondary | ICD-10-CM | POA: Diagnosis not present

## 2011-11-06 DIAGNOSIS — E782 Mixed hyperlipidemia: Secondary | ICD-10-CM

## 2011-11-06 DIAGNOSIS — I119 Hypertensive heart disease without heart failure: Secondary | ICD-10-CM

## 2011-11-06 MED ORDER — ATORVASTATIN CALCIUM 40 MG PO TABS: 20.0000 mg | ORAL_TABLET | Freq: Every day | ORAL | Status: DC

## 2011-11-06 NOTE — Assessment & Plan Note (Signed)
Recent bp fluctuations after resumption of medication. Recommend continuation of current dosing. Monitor bp daily and call bp log report to clinic in ~ 2wks.

## 2011-11-06 NOTE — Progress Notes (Signed)
  Subjective:    Patient ID: Carla Spencer, female    DOB: [birth year], 70 y.o.   MRN: [id number]  HPI Pt presents to clinic for followup of multiple medical problems. BP has been fluctuating since recent surgery and has resumed norvasc and cozaar. Home bp's recently 140's/80's. Tolerating statin tx without myalgias or abn lfts. Reviewed mild persistent elevation of ldl. No other complaint.   Past Medical History  Diagnosis Date  . Vertigo   . Anxiety and depression   . Hyperlipemia   . Hypertension   . TIA (transient ischemic attack)   . Arthritis    Past Surgical History  Procedure Date  . Abdominal hysterectomy   . Bladder suspension   . Purse string     Rectum  . Thyroidectomy     Left  . Rectocele repair   . Colonoscopy     reports that she has quit smoking. She has never used smokeless tobacco. She reports that she does not drink alcohol or use illicit drugs. family history includes Colon polyps in her daughter; Heart disease in her mother; and Stomach cancer in her father. Allergies  Allergen Reactions  . Sulfa Drugs Cross Reactors Rash      Review of Systems see hpi    Objective:   Physical Exam  Physical Exam  Nursing note and vitals reviewed. Constitutional: Appears well-developed and well-nourished. No distress.  HENT:  Head: Normocephalic and atraumatic.  Right Ear: External ear normal.  Left Ear: External ear normal.  Eyes: Conjunctivae are normal. No scleral icterus.  Neck: Neck supple. Carotid bruit is not present.  Cardiovascular: Normal rate, regular rhythm and normal heart sounds.  Exam reveals no gallop and no friction rub.   No murmur heard. Pulmonary/Chest: Effort normal and breath sounds normal. No respiratory distress. He has no wheezes. no rales.  Lymphadenopathy:    He has no cervical adenopathy.  Neurological:Alert.  Skin: Skin is warm and dry. Not diaphoretic.  Psychiatric: Has a normal mood and affect.       Assessment & Plan:

## 2011-11-06 NOTE — Assessment & Plan Note (Signed)
Mildly suboptimal control. Increase lipitor 40mg  qd and recheck lipid/lft in 4-6 wks

## 2011-11-06 NOTE — Patient Instructions (Signed)
Please schedule fasting lipid/lft 272.4 in 6 weeks. Also schedule lipid/lft 272.4, chem7-v58.69 prior to next visit

## 2011-11-08 DIAGNOSIS — E049 Nontoxic goiter, unspecified: Secondary | ICD-10-CM | POA: Diagnosis not present

## 2011-11-23 ENCOUNTER — Other Ambulatory Visit: Payer: Self-pay | Admitting: Dermatology

## 2011-11-23 DIAGNOSIS — L80 Vitiligo: Secondary | ICD-10-CM | POA: Diagnosis not present

## 2011-11-23 DIAGNOSIS — D2110 Benign neoplasm of connective and other soft tissue of unspecified upper limb, including shoulder: Secondary | ICD-10-CM | POA: Diagnosis not present

## 2011-11-23 DIAGNOSIS — L821 Other seborrheic keratosis: Secondary | ICD-10-CM | POA: Diagnosis not present

## 2011-11-23 DIAGNOSIS — L219 Seborrheic dermatitis, unspecified: Secondary | ICD-10-CM | POA: Diagnosis not present

## 2011-11-23 DIAGNOSIS — D2360 Other benign neoplasm of skin of unspecified upper limb, including shoulder: Secondary | ICD-10-CM | POA: Diagnosis not present

## 2011-11-23 DIAGNOSIS — L820 Inflamed seborrheic keratosis: Secondary | ICD-10-CM | POA: Diagnosis not present

## 2012-01-30 DIAGNOSIS — N393 Stress incontinence (female) (male): Secondary | ICD-10-CM | POA: Diagnosis not present

## 2012-01-30 DIAGNOSIS — N816 Rectocele: Secondary | ICD-10-CM | POA: Diagnosis not present

## 2012-01-30 DIAGNOSIS — L918 Other hypertrophic disorders of the skin: Secondary | ICD-10-CM | POA: Diagnosis not present

## 2012-03-13 DIAGNOSIS — D1039 Benign neoplasm of other parts of mouth: Secondary | ICD-10-CM | POA: Diagnosis not present

## 2012-03-14 ENCOUNTER — Other Ambulatory Visit: Payer: Self-pay | Admitting: Internal Medicine

## 2012-03-15 NOTE — Telephone Encounter (Signed)
Rx Done/SLS 

## 2012-03-26 DIAGNOSIS — D1039 Benign neoplasm of other parts of mouth: Secondary | ICD-10-CM | POA: Diagnosis not present

## 2012-04-01 DIAGNOSIS — D1039 Benign neoplasm of other parts of mouth: Secondary | ICD-10-CM | POA: Diagnosis not present

## 2012-05-01 ENCOUNTER — Telehealth: Payer: Self-pay | Admitting: Family

## 2012-05-01 ENCOUNTER — Telehealth: Payer: Self-pay | Admitting: *Deleted

## 2012-05-01 DIAGNOSIS — E041 Nontoxic single thyroid nodule: Secondary | ICD-10-CM | POA: Diagnosis not present

## 2012-05-01 DIAGNOSIS — E785 Hyperlipidemia, unspecified: Secondary | ICD-10-CM | POA: Diagnosis not present

## 2012-05-01 DIAGNOSIS — Z5181 Encounter for therapeutic drug level monitoring: Secondary | ICD-10-CM

## 2012-05-01 DIAGNOSIS — Z79899 Other long term (current) drug therapy: Secondary | ICD-10-CM

## 2012-05-01 LAB — BASIC METABOLIC PANEL
CO2: 29 mEq/L (ref 19–32)
Chloride: 108 mEq/L (ref 96–112)
Glucose, Bld: 80 mg/dL (ref 70–99)
Potassium: 5.3 mEq/L (ref 3.5–5.3)
Sodium: 144 mEq/L (ref 135–145)

## 2012-05-01 LAB — HEPATIC FUNCTION PANEL
ALT: 15 U/L (ref 0–35)
AST: 15 U/L (ref 0–37)
Albumin: 4.4 g/dL (ref 3.5–5.2)
Alkaline Phosphatase: 63 U/L (ref 39–117)
Total Protein: 6.6 g/dL (ref 6.0–8.3)

## 2012-05-01 LAB — LIPID PANEL: Total CHOL/HDL Ratio: 3.9 Ratio

## 2012-05-01 NOTE — Telephone Encounter (Signed)
Pt brought note from dermatologist requesting TSH due to medication monitoring.

## 2012-05-01 NOTE — Telephone Encounter (Signed)
Also schedule lipid/lft 272.4, chem7-v58.69 prior to next visit  Pt presented to the lab. Orders entered per last office note as above.

## 2012-05-02 LAB — TSH: TSH: 3.338 u[IU]/mL (ref 0.350–4.500)

## 2012-05-06 ENCOUNTER — Ambulatory Visit (INDEPENDENT_AMBULATORY_CARE_PROVIDER_SITE_OTHER): Payer: Medicare Other | Admitting: Internal Medicine

## 2012-05-06 ENCOUNTER — Encounter: Payer: Self-pay | Admitting: Internal Medicine

## 2012-05-06 ENCOUNTER — Telehealth: Payer: Self-pay | Admitting: Internal Medicine

## 2012-05-06 VITALS — BP 132/78 | HR 51 | Temp 98.4°F | Resp 14 | Ht 61.5 in | Wt 159.0 lb

## 2012-05-06 DIAGNOSIS — M25569 Pain in unspecified knee: Secondary | ICD-10-CM

## 2012-05-06 DIAGNOSIS — E785 Hyperlipidemia, unspecified: Secondary | ICD-10-CM

## 2012-05-06 DIAGNOSIS — I10 Essential (primary) hypertension: Secondary | ICD-10-CM

## 2012-05-06 DIAGNOSIS — R413 Other amnesia: Secondary | ICD-10-CM

## 2012-05-06 DIAGNOSIS — F3289 Other specified depressive episodes: Secondary | ICD-10-CM

## 2012-05-06 DIAGNOSIS — F329 Major depressive disorder, single episode, unspecified: Secondary | ICD-10-CM | POA: Diagnosis not present

## 2012-05-06 DIAGNOSIS — M25561 Pain in right knee: Secondary | ICD-10-CM

## 2012-05-06 DIAGNOSIS — L723 Sebaceous cyst: Secondary | ICD-10-CM

## 2012-05-06 LAB — VITAMIN B12: Vitamin B-12: 361 pg/mL (ref 211–911)

## 2012-05-06 MED ORDER — MELOXICAM 7.5 MG PO TABS: 7.5000 mg | ORAL_TABLET | Freq: Every day | ORAL | Status: DC | PRN

## 2012-05-06 NOTE — Assessment & Plan Note (Signed)
Normotensive and stable. Continue current regimen. Monitor bp as outpt and followup in clinic as scheduled.  

## 2012-05-06 NOTE — Progress Notes (Signed)
  Subjective:    Patient ID: [name], female    DOB: 17-Mar-1941, 71 y.o.   MRN: [id number]  HPI Pt presents to clinic for followup of multiple medical problems. Notes nightly disturbing dreams with Paxil. Had similar side effect with Effexor. Notes decreased memory and general and wonders about B12 deficiency. Complains of intermittent right knee pain with multiple episodes of buckling and giving way. No fall or injury. Notes intermittent cottage cheese drainage from right posterior skin lesion which is expressible. Currently resolved and asymptomatic. Recent labs  include improved cholesterol, normal liver function tests Chem-7 and TSH.  Past Medical History  Diagnosis Date  . Vertigo   . Anxiety and depression   . Hyperlipemia   . Hypertension   . TIA (transient ischemic attack)   . Arthritis    Past Surgical History  Procedure Date  . Abdominal hysterectomy   . Bladder suspension   . Purse string     Rectum  . Thyroidectomy     Left  . Rectocele repair   . Colonoscopy     reports that she has quit smoking. She has never used smokeless tobacco. She reports that she does not drink alcohol or use illicit drugs. family history includes Colon polyps in her daughter; Heart disease in her mother; and Stomach cancer in her father. Allergies  Allergen Reactions  . Sulfa Drugs Cross Reactors Rash      Review of Systems see hpi     Objective:   Physical Exam  Physical Exam  Nursing note and vitals reviewed. Constitutional: Appears well-developed and well-nourished. No distress.  HENT:  Head: Normocephalic and atraumatic.  Right Ear: External ear normal.  Left Ear: External ear normal.  Eyes: Conjunctivae are normal. No scleral icterus.  Neck: Neck supple. Carotid bruit is not present.  Cardiovascular: Normal rate, regular rhythm and normal heart sounds.  Exam reveals no gallop and no friction rub.   No murmur heard. Pulmonary/Chest: Effort normal and breath sounds  normal. No respiratory distress. He has no wheezes. no rales.  Lymphadenopathy:    He has no cervical adenopathy.  Neurological:Alert.  Skin: Skin is warm and dry. Not diaphoretic.  no current evidence of sebaceous cyst  Psychiatric: Has a normal mood and affect.       Assessment & Plan:

## 2012-05-06 NOTE — Assessment & Plan Note (Signed)
Not currently noted on exam. Consider surgery consult if recurs

## 2012-05-06 NOTE — Assessment & Plan Note (Signed)
Improved. Continue statin therapy.

## 2012-05-06 NOTE — Assessment & Plan Note (Signed)
Decrease Paxil dose 10 mg a day because of dream side effects

## 2012-05-06 NOTE — Assessment & Plan Note (Signed)
Obtain b12

## 2012-05-06 NOTE — Patient Instructions (Signed)
Please schedule fasting labs prior to next visit Cbc, chem7-v58.69 and lipid/lft-272.4 

## 2012-05-07 ENCOUNTER — Other Ambulatory Visit: Payer: Self-pay | Admitting: Internal Medicine

## 2012-05-14 DIAGNOSIS — M1710 Unilateral primary osteoarthritis, unspecified knee: Secondary | ICD-10-CM | POA: Diagnosis not present

## 2012-05-28 DIAGNOSIS — M1710 Unilateral primary osteoarthritis, unspecified knee: Secondary | ICD-10-CM | POA: Diagnosis not present

## 2012-06-05 DIAGNOSIS — M1710 Unilateral primary osteoarthritis, unspecified knee: Secondary | ICD-10-CM | POA: Diagnosis not present

## 2012-06-12 DIAGNOSIS — M1710 Unilateral primary osteoarthritis, unspecified knee: Secondary | ICD-10-CM | POA: Diagnosis not present

## 2012-06-17 DIAGNOSIS — M1710 Unilateral primary osteoarthritis, unspecified knee: Secondary | ICD-10-CM | POA: Diagnosis not present

## 2012-07-06 ENCOUNTER — Other Ambulatory Visit: Payer: Self-pay | Admitting: Internal Medicine

## 2012-07-08 ENCOUNTER — Other Ambulatory Visit: Payer: Self-pay | Admitting: Internal Medicine

## 2012-07-08 DIAGNOSIS — Z1231 Encounter for screening mammogram for malignant neoplasm of breast: Secondary | ICD-10-CM

## 2012-07-08 NOTE — Telephone Encounter (Signed)
Rx to pharmacy/SLS 

## 2012-07-09 DIAGNOSIS — M1710 Unilateral primary osteoarthritis, unspecified knee: Secondary | ICD-10-CM | POA: Diagnosis not present

## 2012-07-16 ENCOUNTER — Encounter (HOSPITAL_COMMUNITY): Payer: Self-pay

## 2012-07-18 ENCOUNTER — Other Ambulatory Visit: Payer: Self-pay | Admitting: Physician Assistant

## 2012-07-19 ENCOUNTER — Encounter (HOSPITAL_COMMUNITY): Payer: Self-pay

## 2012-07-19 ENCOUNTER — Encounter (HOSPITAL_COMMUNITY)
Admission: RE | Admit: 2012-07-19 | Discharge: 2012-07-19 | Disposition: A | Discharge status: Home / Self Care | Payer: Medicare Other | Source: Ambulatory Visit | Attending: Anesthesiology | Admitting: Anesthesiology

## 2012-07-19 ENCOUNTER — Encounter (HOSPITAL_COMMUNITY)
Admission: RE | Admit: 2012-07-19 | Discharge: 2012-07-19 | Disposition: A | Discharge status: Home / Self Care | Payer: Medicare Other | Source: Ambulatory Visit | Attending: Orthopedic Surgery | Admitting: Orthopedic Surgery

## 2012-07-19 DIAGNOSIS — D62 Acute posthemorrhagic anemia: Secondary | ICD-10-CM | POA: Diagnosis not present

## 2012-07-19 DIAGNOSIS — F329 Major depressive disorder, single episode, unspecified: Secondary | ICD-10-CM | POA: Diagnosis not present

## 2012-07-19 DIAGNOSIS — Q211 Atrial septal defect: Secondary | ICD-10-CM | POA: Diagnosis not present

## 2012-07-19 DIAGNOSIS — M1710 Unilateral primary osteoarthritis, unspecified knee: Secondary | ICD-10-CM | POA: Diagnosis not present

## 2012-07-19 DIAGNOSIS — Z01818 Encounter for other preprocedural examination: Secondary | ICD-10-CM | POA: Diagnosis not present

## 2012-07-19 HISTORY — DX: Noninfective gastroenteritis and colitis, unspecified: K52.9

## 2012-07-19 HISTORY — DX: Unspecified cataract: H26.9

## 2012-07-19 HISTORY — DX: Urinary tract infection, site not specified: N39.0

## 2012-07-19 HISTORY — DX: Gastro-esophageal reflux disease without esophagitis: K21.9

## 2012-07-19 LAB — ABO/RH: ABO/RH(D): A POS

## 2012-07-19 LAB — URINALYSIS, ROUTINE W REFLEX MICROSCOPIC
Bilirubin Urine: NEGATIVE
Ketones, ur: NEGATIVE mg/dL
Nitrite: NEGATIVE
Protein, ur: NEGATIVE mg/dL
Urobilinogen, UA: 0.2 mg/dL (ref 0.0–1.0)

## 2012-07-19 LAB — TYPE AND SCREEN
ABO/RH(D): A POS
Antibody Screen: NEGATIVE

## 2012-07-19 LAB — URINE MICROSCOPIC-ADD ON

## 2012-07-19 LAB — CBC WITH DIFFERENTIAL/PLATELET
Basophils Relative: 1 % (ref 0–1)
Eosinophils Absolute: 0.2 10*3/uL (ref 0.0–0.7)
Eosinophils Relative: 3 % (ref 0–5)
Lymphs Abs: 1.7 10*3/uL (ref 0.7–4.0)
MCH: 29.7 pg (ref 26.0–34.0)
MCHC: 33.5 g/dL (ref 30.0–36.0)
MCV: 88.6 fL (ref 78.0–100.0)
Monocytes Relative: 7 % (ref 3–12)
Neutrophils Relative %: 64 % (ref 43–77)
Platelets: 227 10*3/uL (ref 150–400)

## 2012-07-19 LAB — COMPREHENSIVE METABOLIC PANEL
Albumin: 4.1 g/dL (ref 3.5–5.2)
Alkaline Phosphatase: 75 U/L (ref 39–117)
BUN: 17 mg/dL (ref 6–23)
Calcium: 10.5 mg/dL (ref 8.4–10.5)
GFR calc Af Amer: 69 mL/min — ABNORMAL LOW (ref >90)
Glucose, Bld: 82 mg/dL (ref 70–99)
Potassium: 4.1 mEq/L (ref 3.5–5.1)
Sodium: 141 mEq/L (ref 135–145)
Total Protein: 7.3 g/dL (ref 6.0–8.3)

## 2012-07-19 LAB — SURGICAL PCR SCREEN
MRSA, PCR: NEGATIVE
Staphylococcus aureus: NEGATIVE

## 2012-07-19 LAB — PROTIME-INR: Prothrombin Time: 12.8 seconds (ref 11.6–15.2)

## 2012-07-19 MED ORDER — CHLORHEXIDINE GLUCONATE 4 % EX LIQD: 60.0000 mL | Freq: Once | CUTANEOUS | Status: DC

## 2012-07-19 NOTE — Pre-Procedure Instructions (Signed)
20 Carla Spencer  07/19/2012   Your procedure is scheduled on:  Friday July 26, 2012  Report to [facility] at 5:30 AM.  Call this number if you have problems the morning of surgery: [phone]   Remember:   Do not eat food or drink :After Midnight.    Take these medicines the morning of surgery with A SIP OF WATER: amlodipine, paxil    Do not wear jewelry, make-up or nail polish.  Do not wear lotions, powders, or perfumes.   Do not shave 48 hours prior to surgery.  Do not bring valuables to the hospital.  Contacts, dentures or bridgework may not be worn into surgery.  Leave suitcase in the car. After surgery it may be brought to your room.  For patients admitted to the hospital, checkout time is 11:00 AM the day of discharge.   Patients discharged the day of surgery will not be allowed to drive home.  Name and phone number of your driver: family / friend  Special Instructions: Shower using CHG 2 nights before surgery and the night before surgery.  If you shower the day of surgery use CHG.  Use special wash - you have one bottle of CHG for all showers.  You should use approximately 1/3 of the bottle for each shower.   Please read over the following fact sheets that you were given: Pain Booklet, Coughing and Deep Breathing, Blood Transfusion Information, Total Joint Packet, MRSA Information and Surgical Site Infection Prevention

## 2012-07-19 NOTE — Consult Note (Addendum)
Anesthesia Chart Review:  Patient is a 71 year old female scheduled for right TKA by Dr. Madelon Lips on 07/26/12.  Her PAT appointment was on Friday 07/19/12.  I was not asked to evaluate patient at the time of her PAT visit.  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 PCP is Dr. Charlynn Court, who cleared her from a medical and cardiac standpoint. He gave permission to hold her Plavix for 7 days prior to surgery.  She has also been evaluated by Neurologist Dr. Joycelyn Schmid in July 2012 for vertigo, felt likely due to BPPV (records on physical chart).    She was seen by Cardiologist Dr. Dietrich Pates on 04/03/11 for further evaluation of her "hole in her heart".  By her notes, "I have none of the outside records. It sounds like she has a small PFO. MRI supports no embolic events. Exam does not suggest any larger defect. Cardiac exam is normal.  I would keep her on the same regimen though consider switch to aspirin. Will call her once I have reviewed outside records."  Patient told her PAT RN that her previous cardiologist (Dr. Glenice Laine) in [facility] opted to treat her medically instead of closure device.  I don't see any additional outside cardiology records or cardiology visits since.  [facility] medical records and Dr. Ilda Foil office report they never received any of her records from [facility].)    EKG on 07/19/12 showed SB, non-specific ST abnormality, borderline criteria for LVH.  CXR report on 07/19/12 showed:  Abnormal appearance of the inferior right middle lobe with loss of the right heart border. Follow-up chest CT, preferably with infusion recommended for further assessment. Differential considerations include pneumonia, atelectasis and mass  lesion.  Dr. Madelon Lips is aware of results and has scheduled a CT of the chest on 07/23/12 @ 1030.  Preoperative labs noted.  I sent a faxed request for her TEE from Neurologist Dr. Melina Modena (phone: [phone], fax [phone]) earlier today, but I am yet to receive any records.  Ms. Bultman will return to Short Stay on 07/23/12 to sign a release of information.  I'll re-request records from Dr. Quentin Mulling and from [facility] at that time.    I reviewed current information available with Anesthesiologist Dr. Noreene Larsson.  Since patient reports that she has previously been told by a cardiologist that her "hole in her heart" could be managed medically, she can likely proceed as planned--particularly if TEE report is obtained confirming diagnosis of presumed small PFO.    Shonna Chock, PA-C 07/22/12 1650  Addendum: 07/25/12 1105 Chest CT from 07/23/12 showed: 1. No evidence of right perihilar mass or significant middle lobe collapse. Prominent epicardial fat adjacent to the right heart border accounts for the radiographic finding.  2. Scattered tiny pulmonary nodules bilaterally, likely post inflammatory. If the patient is at high risk for bronchogenic carcinoma, follow-up chest CT at 1 year is recommended. If the patient is at low risk, no follow-up is needed. This recommendation follows the consensus statement: Guidelines for Management of Small Pulmonary Nodules Detected on CT Scans: A Statement from the Fleischner Society as published in Radiology 2005; 237:395-400.  3. Nonspecific low density splenic lesions. In a patient without a history of malignancy, these are likely  benign. Follow-up is recommended to assess stability in 6-12 months. That could be performed in conjunction with chest CT if deemed necessary.   Received TEE report on 03/21/2005 from [facility].  Findings showed: No pericardial effusion, normal left ventricular  systolic function, EF 65-70% with no wall motion abnormalities, superior vena cava and pulmonary artery appear normal, mild aortic atherosclerosis, left atrium and left atrial appendage free of thrombus, small patent foramen ovale by agitated saline contrast.  Per my previous conversation with Dr. Noreene Larsson, patient with confirmed small PFO, so anticipate she can proceed if no significant change in her status.  Defer pulmonary follow-up instructions to Dr. Madelon Lips.

## 2012-07-20 LAB — URINE CULTURE: Culture: NO GROWTH

## 2012-07-22 ENCOUNTER — Other Ambulatory Visit: Payer: Self-pay | Admitting: Orthopedic Surgery

## 2012-07-22 DIAGNOSIS — R9389 Abnormal findings on diagnostic imaging of other specified body structures: Secondary | ICD-10-CM

## 2012-07-23 ENCOUNTER — Ambulatory Visit
Admission: RE | Admit: 2012-07-23 | Discharge: 2012-07-23 | Disposition: A | Discharge status: Home / Self Care | Payer: Medicare Other | Source: Ambulatory Visit | Attending: Orthopedic Surgery | Admitting: Orthopedic Surgery

## 2012-07-23 DIAGNOSIS — J984 Other disorders of lung: Secondary | ICD-10-CM | POA: Diagnosis not present

## 2012-07-23 DIAGNOSIS — R9389 Abnormal findings on diagnostic imaging of other specified body structures: Secondary | ICD-10-CM

## 2012-07-23 MED ORDER — IOHEXOL 300 MG/ML  SOLN
75.0000 mL | Freq: Once | INTRAMUSCULAR | Status: AC | PRN
  Administered 2012-07-23: 75 mL via INTRAVENOUS

## 2012-07-24 ENCOUNTER — Ambulatory Visit (HOSPITAL_BASED_OUTPATIENT_CLINIC_OR_DEPARTMENT_OTHER)
Admission: RE | Admit: 2012-07-24 | Discharge: 2012-07-24 | Disposition: A | Discharge status: Home / Self Care | Payer: Medicare Other | Source: Ambulatory Visit | Attending: Internal Medicine | Admitting: Internal Medicine

## 2012-07-24 DIAGNOSIS — Z1231 Encounter for screening mammogram for malignant neoplasm of breast: Secondary | ICD-10-CM | POA: Diagnosis not present

## 2012-07-25 ENCOUNTER — Encounter (HOSPITAL_COMMUNITY): Payer: Self-pay | Admitting: Vascular Surgery

## 2012-07-25 MED ORDER — CEFAZOLIN SODIUM-DEXTROSE 2-3 GM-% IV SOLR
2.0000 g | INTRAVENOUS | Status: AC
  Administered 2012-07-26: 2 g via INTRAVENOUS
  Filled 2012-07-25: qty 50

## 2012-07-25 NOTE — H&P (Signed)
TOTAL KNEE ADMISSION H&P  Patient is being admitted for right total knee arthroplasty.  Subjective:  Chief Complaint:right knee pain.  HPI: Carla Spencer, 71 y.o. female, has a history of pain and functional disability in the right knee due to arthritis and has failed non-surgical conservative treatments for greater than 12 weeks to includeNSAID's and/or analgesics, supervised PT with diminished ADL's post treatment and activity modification.  Onset of symptoms was gradual, with gradually worsening course since that time. The patient noted no past surgery on the right knee(s).  Patient currently rates pain in the right knee(s) at moderate with activity. Patient has night pain, worsening of pain with activity and weight bearing and pain that interferes with activities of daily living.  Patient has evidence of periarticular osteophytes and joint space narrowing by imaging studies.There is no active infection.  Patient Active Problem List   Diagnosis Date Noted  . Sebaceous cyst 05/06/2012  . Memory loss 05/06/2012  . Depression 07/09/2011  . Benign hypertensive heart disease 05/21/2011  . Cystocele 05/21/2011  . Hypertension 04/04/2011  . Other and unspecified hyperlipidemia 03/19/2011  . Thyroid mass 03/19/2011  . ASD (atrial septal defect) 03/19/2011  . Osteoporosis 03/19/2011   Past Medical History  Diagnosis Date  . Vertigo   . Anxiety and depression   . Hyperlipemia   . TIA (transient ischemic attack)     patient on plavix,  saw Dr. Quentin Mulling at [facility]hospital in [place]  . Arthritis   . 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 Melina Modena [phone] [facility]. hospital  . 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 Pneumonia     hx of as baby  . Urinary tract infection     hx of  . GERD (gastroesophageal reflux disease)   . Neuromuscular disorder     carpal tunnel bilaterally  .  Colitis     hx of  . Cataracts, bilateral   . Hypertension     se[place] Dr. Delories Heinz, [place]  . PFO (patent foramen ovale)     echo 03/21/2005 [facility]): normal LV systolic function, EF 65-70%, no wall motion abnormalities, SVC and PA appear normal, mild aortic atherosclerosis, LA and LA appendage free of thrombus, small PFO by agitated saline contrast    Past Surgical History  Procedure Date  . Abdominal hysterectomy   . Bladder suspension   . Purse string     Rectum  . Thyroidectomy     Left  . Rectocele repair   . Colonoscopy   . Breast surgery     "breast lumps removed"  . Dilation and curettage of uterus      (Not in a hospital admission) Allergies  Allergen Reactions  . Sulfa Drugs Cross Reactors Rash    History  Substance Use Topics  . Smoking status: Former Games developer  . Smokeless tobacco: Never Used     Comment: smoked 1/4 of pack from [phone]  . Alcohol Use: No    Family History  Problem Relation Age of Onset  . Heart disease Mother   . Colon polyps Daughter   . Stomach cancer Father      Review of Systems  Constitutional: Negative.   HENT: Negative.   Eyes: Negative for pain, discharge and redness.  Respiratory: Positive for cough and shortness of breath. Negative for hemoptysis and wheezing.   Cardiovascular: Negative  for chest pain and palpitations.  Gastrointestinal: Negative for heartburn, nausea, vomiting, abdominal pain, diarrhea, constipation, blood in stool and melena.  Genitourinary: Negative for dysuria, urgency, frequency and hematuria.  Musculoskeletal: Positive for joint pain.  Skin: Negative.   Neurological: Positive for dizziness.  Endo/Heme/Allergies: Bruises/bleeds easily.  Psychiatric/Behavioral: The patient is nervous/anxious.     Objective:  Physical Exam  Constitutional: She is oriented to person, place, and time. She appears well-developed and well-nourished. No distress.  HENT:  Head: Normocephalic and  atraumatic.  Nose: Nose normal.  Eyes: Conjunctivae normal and EOM are normal. Pupils are equal, round, and reactive to light.  Neck: Normal range of motion. Neck supple.  Cardiovascular: Normal rate, regular rhythm, normal heart sounds and intact distal pulses.   No murmur heard. Respiratory: Effort normal and breath sounds normal. No respiratory distress. She has no wheezes.  GI: Soft. Bowel sounds are normal. She exhibits no distension. There is no tenderness.  Musculoskeletal:       Right knee: tenderness found. Medial joint line tenderness noted.       NVI, no calf tenderness, intact dorsi/plantarflex ankle, stable ligamentous testing, bakers cyst.  Lymphadenopathy:    She has no cervical adenopathy.  Neurological: She is alert and oriented to person, place, and time. No cranial nerve deficit.  Skin: Skin is warm and dry. No rash noted. No erythema.  Psychiatric: She has a normal mood and affect.    Vital signs: temp 98.4, HR 58, RR 18, BP 139/92  Labs:   Estimated Body mass index is 29.56 kg/(m^2) as calculated from the following:   Height as of 05/06/12: 5' 1.5"(1.562 m).   Weight as of 05/06/12: 159 lb(72.122 kg).   Imaging Review Plain radiographs demonstrate severe degenerative joint disease of the right knee(s). The overall alignment isneutral. The bone quality appears to be good for age and reported activity level.  Assessment/Plan:  End stage arthritis, right knee   The patient history, physical examination, clinical judgment of the provider and imaging studies are consistent with end stage degenerative joint disease of the right knee(s) and total knee arthroplasty is deemed medically necessary. The treatment options including medical management, injection therapy arthroscopy and arthroplasty were discussed at length. The risks and benefits of total knee arthroplasty were presented and reviewed. The risks due to aseptic loosening, infection, stiffness, patella tracking  problems, thromboembolic complications and other imponderables were discussed. The patient acknowledged the explanation, agreed to proceed with the plan and consent was signed. Patient is being admitted for inpatient treatment for surgery, pain control, PT, OT, prophylactic antibiotics, VTE prophylaxis, progressive ambulation and ADL's and discharge planning. The patient is planning to be discharged home with home health services

## 2012-07-26 ENCOUNTER — Encounter (HOSPITAL_COMMUNITY)
Admission: RE | Disposition: A | Discharge status: Home Health | Payer: Self-pay | Source: Ambulatory Visit | Attending: Orthopedic Surgery

## 2012-07-26 ENCOUNTER — Inpatient Hospital Stay (HOSPITAL_COMMUNITY)
Admission: RE | Admit: 2012-07-26 | Discharge: 2012-07-28 | DRG: 470 | Disposition: A | Discharge status: Home Health | Payer: Medicare Other | Source: Ambulatory Visit | Attending: Orthopedic Surgery | Admitting: Orthopedic Surgery

## 2012-07-26 ENCOUNTER — Encounter (HOSPITAL_COMMUNITY): Payer: Self-pay | Admitting: *Deleted

## 2012-07-26 ENCOUNTER — Encounter (HOSPITAL_COMMUNITY): Payer: Self-pay | Admitting: Vascular Surgery

## 2012-07-26 ENCOUNTER — Inpatient Hospital Stay (HOSPITAL_COMMUNITY): Payer: Medicare Other | Admitting: Vascular Surgery

## 2012-07-26 DIAGNOSIS — Z01818 Encounter for other preprocedural examination: Secondary | ICD-10-CM | POA: Diagnosis not present

## 2012-07-26 DIAGNOSIS — Q2111 Secundum atrial septal defect: Secondary | ICD-10-CM

## 2012-07-26 DIAGNOSIS — M1711 Unilateral primary osteoarthritis, right knee: Secondary | ICD-10-CM | POA: Diagnosis present

## 2012-07-26 DIAGNOSIS — M810 Age-related osteoporosis without current pathological fracture: Secondary | ICD-10-CM | POA: Diagnosis present

## 2012-07-26 DIAGNOSIS — M1710 Unilateral primary osteoarthritis, unspecified knee: Principal | ICD-10-CM | POA: Diagnosis present

## 2012-07-26 DIAGNOSIS — F3289 Other specified depressive episodes: Secondary | ICD-10-CM | POA: Diagnosis present

## 2012-07-26 DIAGNOSIS — F411 Generalized anxiety disorder: Secondary | ICD-10-CM | POA: Diagnosis present

## 2012-07-26 DIAGNOSIS — M25569 Pain in unspecified knee: Secondary | ICD-10-CM | POA: Diagnosis not present

## 2012-07-26 DIAGNOSIS — Z01810 Encounter for preprocedural cardiovascular examination: Secondary | ICD-10-CM

## 2012-07-26 DIAGNOSIS — G8918 Other acute postprocedural pain: Secondary | ICD-10-CM | POA: Diagnosis not present

## 2012-07-26 DIAGNOSIS — Z8673 Personal history of transient ischemic attack (TIA), and cerebral infarction without residual deficits: Secondary | ICD-10-CM

## 2012-07-26 DIAGNOSIS — Z01812 Encounter for preprocedural laboratory examination: Secondary | ICD-10-CM | POA: Diagnosis not present

## 2012-07-26 DIAGNOSIS — F329 Major depressive disorder, single episode, unspecified: Secondary | ICD-10-CM | POA: Diagnosis present

## 2012-07-26 DIAGNOSIS — I10 Essential (primary) hypertension: Secondary | ICD-10-CM | POA: Diagnosis present

## 2012-07-26 DIAGNOSIS — E785 Hyperlipidemia, unspecified: Secondary | ICD-10-CM | POA: Diagnosis present

## 2012-07-26 DIAGNOSIS — Q211 Atrial septal defect: Secondary | ICD-10-CM | POA: Diagnosis not present

## 2012-07-26 DIAGNOSIS — D62 Acute posthemorrhagic anemia: Secondary | ICD-10-CM | POA: Diagnosis not present

## 2012-07-26 DIAGNOSIS — IMO0002 Reserved for concepts with insufficient information to code with codable children: Secondary | ICD-10-CM | POA: Diagnosis not present

## 2012-07-26 HISTORY — PX: TOTAL KNEE ARTHROPLASTY: SHX125

## 2012-07-26 SURGERY — ARTHROPLASTY, KNEE, TOTAL
Anesthesia: General | Site: Knee | Laterality: Right | Wound class: Clean

## 2012-07-26 MED ORDER — ZINC SULFATE 220 (50 ZN) MG PO CAPS
220.0000 mg | ORAL_CAPSULE | Freq: Every day | ORAL | Status: DC
  Administered 2012-07-27 – 2012-07-28 (×2): 220 mg via ORAL
  Filled 2012-07-26 (×2): qty 1

## 2012-07-26 MED ORDER — ACETAMINOPHEN 10 MG/ML IV SOLN
1000.0000 mg | Freq: Four times a day (QID) | INTRAVENOUS | Status: AC
  Administered 2012-07-26 – 2012-07-27 (×4): 1000 mg via INTRAVENOUS
  Filled 2012-07-26 (×4): qty 100

## 2012-07-26 MED ORDER — LIDOCAINE HCL (CARDIAC) 20 MG/ML IV SOLN
INTRAVENOUS | Status: DC | PRN
  Administered 2012-07-26: 80 mg via INTRAVENOUS

## 2012-07-26 MED ORDER — HYDROMORPHONE HCL PF 1 MG/ML IJ SOLN
INTRAMUSCULAR | Status: AC
  Filled 2012-07-26: qty 1

## 2012-07-26 MED ORDER — HYDROMORPHONE HCL PF 1 MG/ML IJ SOLN
0.2500 mg | INTRAMUSCULAR | Status: DC | PRN
Start: 2012-07-26 — End: 2012-07-26

## 2012-07-26 MED ORDER — ROCURONIUM BROMIDE 100 MG/10ML IV SOLN
INTRAVENOUS | Status: DC | PRN
  Administered 2012-07-26: 40 mg via INTRAVENOUS

## 2012-07-26 MED ORDER — SODIUM CHLORIDE 0.9 % IV SOLN: INTRAVENOUS | Status: DC

## 2012-07-26 MED ORDER — ACETAMINOPHEN 10 MG/ML IV SOLN
1000.0000 mg | Freq: Four times a day (QID) | INTRAVENOUS | Status: DC
  Administered 2012-07-26: 1000 mg via INTRAVENOUS
  Filled 2012-07-26 (×4): qty 100

## 2012-07-26 MED ORDER — ATORVASTATIN CALCIUM 20 MG PO TABS
20.0000 mg | ORAL_TABLET | Freq: Every day | ORAL | Status: DC
  Administered 2012-07-27: 20 mg via ORAL
  Filled 2012-07-26 (×2): qty 1

## 2012-07-26 MED ORDER — LOSARTAN POTASSIUM 50 MG PO TABS
100.0000 mg | ORAL_TABLET | Freq: Every day | ORAL | Status: DC
  Administered 2012-07-27 – 2012-07-28 (×2): 100 mg via ORAL
  Filled 2012-07-26 (×3): qty 2

## 2012-07-26 MED ORDER — METHOCARBAMOL 500 MG PO TABS
500.0000 mg | ORAL_TABLET | Freq: Four times a day (QID) | ORAL | Status: DC | PRN
  Administered 2012-07-27 – 2012-07-28 (×3): 500 mg via ORAL
  Filled 2012-07-26 (×4): qty 1

## 2012-07-26 MED ORDER — CEFAZOLIN SODIUM 1-5 GM-% IV SOLN
1.0000 g | Freq: Four times a day (QID) | INTRAVENOUS | Status: AC
  Administered 2012-07-26 (×2): 1 g via INTRAVENOUS
  Filled 2012-07-26 (×2): qty 50

## 2012-07-26 MED ORDER — LACTATED RINGERS IV SOLN
INTRAVENOUS | Status: DC | PRN
  Administered 2012-07-26 (×2): via INTRAVENOUS

## 2012-07-26 MED ORDER — OXYCODONE-ACETAMINOPHEN 5-325 MG PO TABS: ORAL_TABLET | ORAL | Status: DC

## 2012-07-26 MED ORDER — ONDANSETRON HCL 4 MG PO TABS
4.0000 mg | ORAL_TABLET | Freq: Four times a day (QID) | ORAL | Status: DC | PRN
  Administered 2012-07-26: 4 mg via ORAL
  Filled 2012-07-26: qty 1

## 2012-07-26 MED ORDER — ACETAMINOPHEN 10 MG/ML IV SOLN
1000.0000 mg | Freq: Four times a day (QID) | INTRAVENOUS | Status: DC
  Administered 2012-07-26: 1000 mg via INTRAVENOUS

## 2012-07-26 MED ORDER — EPHEDRINE SULFATE 50 MG/ML IJ SOLN
INTRAMUSCULAR | Status: DC | PRN
  Administered 2012-07-26: 10 mg via INTRAVENOUS
  Administered 2012-07-26: 15 mg via INTRAVENOUS

## 2012-07-26 MED ORDER — FENTANYL CITRATE 0.05 MG/ML IJ SOLN
INTRAMUSCULAR | Status: DC | PRN
  Administered 2012-07-26 (×2): 50 ug via INTRAVENOUS
  Administered 2012-07-26: 100 ug via INTRAVENOUS

## 2012-07-26 MED ORDER — METHOCARBAMOL 500 MG PO TABS: 500.0000 mg | ORAL_TABLET | Freq: Four times a day (QID) | ORAL | Status: DC

## 2012-07-26 MED ORDER — METHOCARBAMOL 100 MG/ML IJ SOLN
500.0000 mg | Freq: Four times a day (QID) | INTRAVENOUS | Status: DC | PRN
  Administered 2012-07-26: 500 mg via INTRAVENOUS
  Filled 2012-07-26: qty 5

## 2012-07-26 MED ORDER — MAGNESIUM OXIDE 400 (241.3 MG) MG PO TABS
400.0000 mg | ORAL_TABLET | Freq: Every day | ORAL | Status: DC
  Administered 2012-07-27 – 2012-07-28 (×2): 400 mg via ORAL
  Filled 2012-07-26 (×2): qty 1

## 2012-07-26 MED ORDER — VITAMIN D3 25 MCG (1000 UNIT) PO TABS
1000.0000 [IU] | ORAL_TABLET | Freq: Every day | ORAL | Status: DC
  Administered 2012-07-27 – 2012-07-28 (×2): 1000 [IU] via ORAL
  Filled 2012-07-26 (×3): qty 1

## 2012-07-26 MED ORDER — PHENOL 1.4 % MT LIQD: 1.0000 | OROMUCOSAL | Status: DC | PRN

## 2012-07-26 MED ORDER — ACETAMINOPHEN 325 MG PO TABS: 650.0000 mg | ORAL_TABLET | Freq: Four times a day (QID) | ORAL | Status: DC | PRN

## 2012-07-26 MED ORDER — CALCIUM-MAGNESIUM-ZINC 333-133-5 MG PO TABS: 2.0000 | ORAL_TABLET | Freq: Every day | ORAL | Status: DC

## 2012-07-26 MED ORDER — OXYCODONE HCL 5 MG PO TABS
5.0000 mg | ORAL_TABLET | ORAL | Status: DC | PRN
  Administered 2012-07-26 – 2012-07-28 (×7): 10 mg via ORAL
  Administered 2012-07-28: 5 mg via ORAL
  Filled 2012-07-26: qty 1
  Filled 2012-07-26 (×6): qty 2

## 2012-07-26 MED ORDER — PROPOFOL 10 MG/ML IV BOLUS
INTRAVENOUS | Status: DC | PRN
  Administered 2012-07-26: 170 mg via INTRAVENOUS

## 2012-07-26 MED ORDER — ONDANSETRON HCL 4 MG/2ML IJ SOLN
INTRAMUSCULAR | Status: DC | PRN
  Administered 2012-07-26: 4 mg via INTRAVENOUS

## 2012-07-26 MED ORDER — ENOXAPARIN SODIUM 30 MG/0.3ML ~~LOC~~ SOLN: 30.0000 mg | Freq: Two times a day (BID) | SUBCUTANEOUS | Status: DC

## 2012-07-26 MED ORDER — AMLODIPINE BESYLATE 2.5 MG PO TABS
2.5000 mg | ORAL_TABLET | Freq: Every day | ORAL | Status: DC
  Administered 2012-07-27 – 2012-07-28 (×2): 2.5 mg via ORAL
  Filled 2012-07-26 (×3): qty 1

## 2012-07-26 MED ORDER — METOCLOPRAMIDE HCL 10 MG PO TABS: 5.0000 mg | ORAL_TABLET | Freq: Three times a day (TID) | ORAL | Status: DC | PRN

## 2012-07-26 MED ORDER — MENTHOL 3 MG MT LOZG
1.0000 | LOZENGE | OROMUCOSAL | Status: DC | PRN
  Filled 2012-07-26: qty 9

## 2012-07-26 MED ORDER — HYDROMORPHONE HCL PF 1 MG/ML IJ SOLN
0.2500 mg | INTRAMUSCULAR | Status: DC | PRN
  Administered 2012-07-26 (×4): 0.5 mg via INTRAVENOUS

## 2012-07-26 MED ORDER — SODIUM CHLORIDE 0.9 % IV SOLN
INTRAVENOUS | Status: DC
  Administered 2012-07-26: 14:00:00 via INTRAVENOUS
  Administered 2012-07-27: 50 mL/h via INTRAVENOUS

## 2012-07-26 MED ORDER — BISACODYL 10 MG RE SUPP: 10.0000 mg | Freq: Every day | RECTAL | Status: DC | PRN

## 2012-07-26 MED ORDER — SODIUM CHLORIDE 0.9 % IR SOLN
Status: DC | PRN
  Administered 2012-07-26: 1000 mL

## 2012-07-26 MED ORDER — CALCIUM CARBONATE ANTACID 500 MG PO CHEW
1.0000 | CHEWABLE_TABLET | Freq: Every day | ORAL | Status: DC
  Administered 2012-07-27 – 2012-07-28 (×2): 200 mg via ORAL
  Filled 2012-07-26 (×2): qty 1

## 2012-07-26 MED ORDER — ENOXAPARIN SODIUM 30 MG/0.3ML ~~LOC~~ SOLN
30.0000 mg | Freq: Two times a day (BID) | SUBCUTANEOUS | Status: DC
  Administered 2012-07-27 – 2012-07-28 (×3): 30 mg via SUBCUTANEOUS
  Filled 2012-07-26 (×5): qty 0.3

## 2012-07-26 MED ORDER — HYDROMORPHONE HCL PF 1 MG/ML IJ SOLN: 1.0000 mg | INTRAMUSCULAR | Status: DC | PRN

## 2012-07-26 MED ORDER — ACETAMINOPHEN 10 MG/ML IV SOLN
INTRAVENOUS | Status: AC
  Filled 2012-07-26: qty 100

## 2012-07-26 MED ORDER — METOCLOPRAMIDE HCL 5 MG/ML IJ SOLN
5.0000 mg | Freq: Three times a day (TID) | INTRAMUSCULAR | Status: DC | PRN
  Administered 2012-07-26: 10 mg via INTRAVENOUS

## 2012-07-26 MED ORDER — ARTIFICIAL TEARS OP OINT
TOPICAL_OINTMENT | OPHTHALMIC | Status: DC | PRN
  Administered 2012-07-26: 1 via OPHTHALMIC

## 2012-07-26 MED ORDER — GLYCOPYRROLATE 0.2 MG/ML IJ SOLN
INTRAMUSCULAR | Status: DC | PRN
  Administered 2012-07-26: 0.4 mg via INTRAVENOUS

## 2012-07-26 MED ORDER — ACETAMINOPHEN 650 MG RE SUPP: 650.0000 mg | Freq: Four times a day (QID) | RECTAL | Status: DC | PRN

## 2012-07-26 MED ORDER — DOCUSATE SODIUM 100 MG PO CAPS
100.0000 mg | ORAL_CAPSULE | Freq: Two times a day (BID) | ORAL | Status: DC
  Administered 2012-07-27 – 2012-07-28 (×4): 100 mg via ORAL
  Filled 2012-07-26 (×4): qty 1

## 2012-07-26 MED ORDER — NEOSTIGMINE METHYLSULFATE 1 MG/ML IJ SOLN
INTRAMUSCULAR | Status: DC | PRN
  Administered 2012-07-26: 3 mg via INTRAVENOUS

## 2012-07-26 MED ORDER — PAROXETINE HCL 10 MG PO TABS
10.0000 mg | ORAL_TABLET | Freq: Every day | ORAL | Status: DC
  Administered 2012-07-27 – 2012-07-28 (×2): 10 mg via ORAL
  Filled 2012-07-26 (×3): qty 1

## 2012-07-26 MED ORDER — FLEET ENEMA 7-19 GM/118ML RE ENEM: 1.0000 | ENEMA | Freq: Once | RECTAL | Status: AC | PRN

## 2012-07-26 MED ORDER — SENNOSIDES-DOCUSATE SODIUM 8.6-50 MG PO TABS
1.0000 | ORAL_TABLET | Freq: Every evening | ORAL | Status: DC | PRN
  Administered 2012-07-27: 1 via ORAL
  Filled 2012-07-26: qty 1

## 2012-07-26 MED ORDER — ONDANSETRON HCL 4 MG/2ML IJ SOLN
4.0000 mg | Freq: Four times a day (QID) | INTRAMUSCULAR | Status: DC | PRN
  Administered 2012-07-27: 4 mg via INTRAVENOUS
  Filled 2012-07-26: qty 2

## 2012-07-26 MED ORDER — MIDAZOLAM HCL 5 MG/5ML IJ SOLN
INTRAMUSCULAR | Status: DC | PRN
  Administered 2012-07-26: 2 mg via INTRAVENOUS

## 2012-07-26 SURGICAL SUPPLY — 59 items
BANDAGE ELASTIC 4 VELCRO ST LF (GAUZE/BANDAGES/DRESSINGS) ×2 IMPLANT
BANDAGE ELASTIC 6 VELCRO ST LF (GAUZE/BANDAGES/DRESSINGS) ×2 IMPLANT
BANDAGE ESMARK 6X9 LF (GAUZE/BANDAGES/DRESSINGS) ×1 IMPLANT
BLADE SAGITTAL 25.0X1.19X90 (BLADE) ×2 IMPLANT
BLADE SAW SAG 90X13X1.27 (BLADE) ×2 IMPLANT
BNDG ESMARK 6X9 LF (GAUZE/BANDAGES/DRESSINGS) ×2
BOWL SMART MIX CTS (DISPOSABLE) ×2 IMPLANT
CEMENT HV SMART SET (Cement) ×2 IMPLANT
CLOTH BEACON ORANGE TIMEOUT ST (SAFETY) ×2 IMPLANT
COVER BACK TABLE 24X17X13 BIG (DRAPES) IMPLANT
COVER SURGICAL LIGHT HANDLE (MISCELLANEOUS) ×2 IMPLANT
CUFF TOURNIQUET SINGLE 34IN LL (TOURNIQUET CUFF) ×2 IMPLANT
CUFF TOURNIQUET SINGLE 44IN (TOURNIQUET CUFF) IMPLANT
DRAPE INCISE IOBAN 66X45 STRL (DRAPES) IMPLANT
DRAPE ORTHO SPLIT 77X108 STRL (DRAPES) ×2
DRAPE SURG ORHT 6 SPLT 77X108 (DRAPES) ×2 IMPLANT
DRAPE U-SHAPE 47X51 STRL (DRAPES) ×2 IMPLANT
DRSG ADAPTIC 3X8 NADH LF (GAUZE/BANDAGES/DRESSINGS) ×2 IMPLANT
DRSG PAD ABDOMINAL 8X10 ST (GAUZE/BANDAGES/DRESSINGS) ×2 IMPLANT
DURAPREP 26ML APPLICATOR (WOUND CARE) ×2 IMPLANT
ELECT REM PT RETURN 9FT ADLT (ELECTROSURGICAL) ×2
ELECTRODE REM PT RTRN 9FT ADLT (ELECTROSURGICAL) ×1 IMPLANT
EVACUATOR 1/8 PVC DRAIN (DRAIN) ×2 IMPLANT
FACESHIELD LNG OPTICON STERILE (SAFETY) ×4 IMPLANT
FLOSEAL 10ML (HEMOSTASIS) IMPLANT
GLOVE BIOGEL PI IND STRL 8 (GLOVE) ×4 IMPLANT
GLOVE BIOGEL PI INDICATOR 8 (GLOVE) ×4
GLOVE ORTHO TXT STRL SZ7.5 (GLOVE) ×6 IMPLANT
GLOVE SURG ORTHO 8.0 STRL STRW (GLOVE) ×6 IMPLANT
GOWN PREVENTION PLUS XLARGE (GOWN DISPOSABLE) ×2 IMPLANT
GOWN PREVENTION PLUS XXLARGE (GOWN DISPOSABLE) ×2 IMPLANT
GOWN STRL NON-REIN LRG LVL3 (GOWN DISPOSABLE) ×4 IMPLANT
HANDPIECE INTERPULSE COAX TIP (DISPOSABLE) ×1
HOOD PEEL AWAY FACE SHEILD DIS (HOOD) ×2 IMPLANT
IMMOBILIZER KNEE 22 UNIV (SOFTGOODS) ×2 IMPLANT
KIT BASIN OR (CUSTOM PROCEDURE TRAY) ×2 IMPLANT
KIT ROOM TURNOVER OR (KITS) ×2 IMPLANT
MANIFOLD NEPTUNE II (INSTRUMENTS) ×2 IMPLANT
NEEDLE 22X1 1/2 (OR ONLY) (NEEDLE) IMPLANT
NS IRRIG 1000ML POUR BTL (IV SOLUTION) ×2 IMPLANT
PACK TOTAL JOINT (CUSTOM PROCEDURE TRAY) ×2 IMPLANT
PAD ARMBOARD 7.5X6 YLW CONV (MISCELLANEOUS) ×4 IMPLANT
PAD CAST 4YDX4 CTTN HI CHSV (CAST SUPPLIES) ×1 IMPLANT
PADDING CAST COTTON 4X4 STRL (CAST SUPPLIES) ×1
PADDING CAST COTTON 6X4 STRL (CAST SUPPLIES) ×2 IMPLANT
SET HNDPC FAN SPRY TIP SCT (DISPOSABLE) ×1 IMPLANT
SPONGE GAUZE 4X4 12PLY (GAUZE/BANDAGES/DRESSINGS) ×2 IMPLANT
STAPLER VISISTAT 35W (STAPLE) ×2 IMPLANT
SUCTION FRAZIER TIP 10 FR DISP (SUCTIONS) ×2 IMPLANT
SUT ETHIBOND NAB CT1 #1 30IN (SUTURE) ×6 IMPLANT
SUT VIC AB 0 CT1 27 (SUTURE) ×1
SUT VIC AB 0 CT1 27XBRD ANBCTR (SUTURE) ×1 IMPLANT
SUT VIC AB 2-0 CT1 27 (SUTURE) ×2
SUT VIC AB 2-0 CT1 TAPERPNT 27 (SUTURE) ×2 IMPLANT
SYR CONTROL 10ML LL (SYRINGE) IMPLANT
TOWEL OR 17X24 6PK STRL BLUE (TOWEL DISPOSABLE) ×2 IMPLANT
TOWEL OR 17X26 10 PK STRL BLUE (TOWEL DISPOSABLE) ×2 IMPLANT
TRAY FOLEY CATH 14FR (SET/KITS/TRAYS/PACK) ×2 IMPLANT
WATER STERILE IRR 1000ML POUR (IV SOLUTION) ×6 IMPLANT

## 2012-07-26 NOTE — Progress Notes (Signed)
Orthopedic Tech Progress Note Patient Details:  Carla Spencer Dec 02, 1940 [id number] CPM applied to Right knee with appropriate settings. OHF applied to bed. CPM Right Knee CPM Right Knee: On Right Knee Flexion (Degrees): 60  Right Knee Extension (Degrees): 0    Asia R Thompson 07/26/2012, 11:32 AM

## 2012-07-26 NOTE — Brief Op Note (Signed)
07/26/2012  9:44 AM  PATIENT:  Carla Spencer  71 y.o. female  PRE-OPERATIVE DIAGNOSIS:  degenerative joint disease right knee   POST-OPERATIVE DIAGNOSIS:  degenerative joint disease right knee   PROCEDURE:  Procedure(s) (LRB) with comments: TOTAL KNEE ARTHROPLASTY (Right)  SURGEON:  Surgeon(s) and Role:    * W D Carloyn Manner., MD - Primary  PHYSICIAN ASSISTANT:   ASSISTANTS: Margart Sickles, PA-C   ANESTHESIA:   general and femoral block  EBL:  Total I/O In: 1600 [I.V.:1600] Out: 225 [Urine:150; Blood:75]  BLOOD ADMINISTERED:none  DRAINS: hemovac right knee, self suction  LOCAL MEDICATIONS USED:  NONE  SPECIMEN:  No Specimen  DISPOSITION OF SPECIMEN:  N/A  COUNTS:  YES  TOURNIQUET:  * Missing tourniquet times found for documented tourniquets in log:  [id number] *  DICTATION: .Other Dictation: Dictation Number   PLAN OF CARE: Admit to inpatient   PATIENT DISPOSITION:  PACU - hemodynamically stable.   Delay start of Pharmacological VTE agent (>24hrs) due to surgical blood loss or risk of bleeding: yes

## 2012-07-26 NOTE — Anesthesia Procedure Notes (Addendum)
Anesthesia Regional Block:  Femoral nerve block  Pre-Anesthetic Checklist: ,, timeout performed, Correct Patient, Correct Site, Correct Laterality, Correct Procedure, Correct Position, site marked, Risks and benefits discussed, Surgical consent,  Pre-op evaluation,  Post-op pain management  Laterality: Right  Prep: chloraprep       Needles:   Needle Type: Echogenic Stimulator Needle          Additional Needles:  Procedures: Doppler guided Femoral nerve block  Nerve Stimulator or Paresthesia:  Response: 0.5 mA,   Additional Responses:   Narrative:  Start time: 07/26/2012 7:05 AM End time: 07/26/2012 7:20 AM Injection made incrementally with aspirations every 5 mL.  Performed by: Personally  Anesthesiologist: Dr. Randa Evens   Procedure Name: Intubation Date/Time: 07/26/2012 7:48 AM Performed by: Jerilee Hoh Pre-anesthesia Checklist: Patient identified, Emergency Drugs available, Suction available and Patient being monitored Patient Re-evaluated:Patient Re-evaluated prior to inductionOxygen Delivery Method: Circle system utilized Preoxygenation: Pre-oxygenation with 100% oxygen Intubation Type: IV induction Ventilation: Mask ventilation without difficulty Laryngoscope Size: Mac and 3 Grade View: Grade I Tube type: Oral Tube size: 7.5 mm Number of attempts: 1 Airway Equipment and Method: Stylet Placement Confirmation: ETT inserted through vocal cords under direct vision,  positive ETCO2 and breath sounds checked- equal and bilateral Secured at: 21 cm Tube secured with: Tape Dental Injury: Teeth and Oropharynx as per pre-operative assessment

## 2012-07-26 NOTE — Anesthesia Postprocedure Evaluation (Signed)
  Anesthesia Post-op Note  Patient: Carla Spencer  Procedure(s) Performed: Procedure(s) (LRB) with comments: TOTAL KNEE ARTHROPLASTY (Right)  Patient Location: PACU  Anesthesia Type:General  Level of Consciousness: awake  Airway and Oxygen Therapy: Patient Spontanous Breathing  Post-op Pain: mild  Post-op Assessment: Post-op Vital signs reviewed  Post-op Vital Signs: Reviewed  Complications: No apparent anesthesia complications

## 2012-07-26 NOTE — Preoperative (Signed)
Beta Blockers   Reason not to administer Beta Blockers:Not Applicable 

## 2012-07-26 NOTE — Interval H&P Note (Signed)
History and Physical Interval Note:  07/26/2012 7:36 AM  Carla Spencer  has presented today for surgery, with the diagnosis of djd right knee   The various methods of treatment have been discussed with the patient and family. After consideration of risks, benefits and other options for treatment, the patient has consented to  Procedure(s) (LRB) with comments: TOTAL KNEE ARTHROPLASTY (Right) as a surgical intervention .  The patient's history has been reviewed, patient examined, no change in status, stable for surgery.  I have reviewed the patient's chart and labs.  Questions were answered to the patient's satisfaction.     Rockwell Zentz JR,W D

## 2012-07-26 NOTE — Transfer of Care (Signed)
Immediate Anesthesia Transfer of Care Note  Patient: Carla Spencer  Procedure(s) Performed: Procedure(s) (LRB) with comments: TOTAL KNEE ARTHROPLASTY (Right)  Patient Location: PACU  Anesthesia Type:GA combined with regional for post-op pain  Level of Consciousness: awake, alert , oriented and patient cooperative  Airway & Oxygen Therapy: Patient Spontanous Breathing and Patient connected to nasal cannula oxygen  Post-op Assessment: Report given to PACU RN, Post -op Vital signs reviewed and stable and Patient moving all extremities  Post vital signs: Reviewed and stable  Complications: No apparent anesthesia complications

## 2012-07-26 NOTE — Evaluation (Signed)
Physical Therapy Evaluation Patient Details Name: Carla Spencer MRN: [id number] DOB: 12-Aug-1941 Today's Date: 07/26/2012 Time: 5621-3086 PT Time Calculation (min): 42 min  PT Assessment / Plan / Recommendation Clinical Impression  71 yo s/p Rt TKA with post-op nausea and vomiting limiting activity tolerance today. Pt will benefit from PT to address the deficits listed below and prepare for d/c home with spouse.    PT Assessment  Patient needs continued PT services    Follow Up Recommendations  Home health PT;Supervision/Assistance - 24 hour    Does the patient have the potential to tolerate intense rehabilitation      Barriers to Discharge None      Equipment Recommendations  None recommended by PT    Recommendations for Other Services     Frequency 7X/week    Precautions / Restrictions Precautions Precautions: Knee Precaution Booklet Issued: No Required Braces or Orthoses: Knee Immobilizer - Right Knee Immobilizer - Right: On when out of bed or walking Restrictions Weight Bearing Restrictions: Yes RLE Weight Bearing: Weight bearing as tolerated   Pertinent Vitals/Pain Rt knee 5/10 Pt with nausea and vomiting upon sitting EOB      Mobility  Bed Mobility Bed Mobility: Supine to Sit;Sitting - Scoot to Edge of Bed Supine to Sit: 4: Min assist;HOB elevated Sitting - Scoot to [place] of Bed: 4: Min assist Details for Bed Mobility Assistance: assist to RLE; upon sitting EOB pt became nauseous and ultimately vomited large amount;  Transfers Transfers: Sit to Stand;Stand to Sit Sit to Stand: 4: Min assist;From elevated surface;With upper extremity assist;From bed Stand to Sit: 4: Min assist;With upper extremity assist;With armrests;To chair/3-in-1 Details for Transfer Assistance: cues for safe, proper use of RW; assist due to dizziness Ambulation/Gait Ambulation/Gait Assistance: 4: Min assist Ambulation Distance (Feet): 5 Feet Assistive device: Rolling  walker Ambulation/Gait Assistance Details: cues for sequencing; assist advancing RW Gait Pattern: Step-to pattern;Decreased stride length;Antalgic    Shoulder Instructions     Exercises Total Joint Exercises Ankle Circles/Pumps: AROM;Both;15 reps;Supine   PT Diagnosis: Difficulty walking;Acute pain  PT Problem List: Decreased strength;Decreased range of motion;Decreased activity tolerance;Decreased mobility;Decreased knowledge of use of DME;Decreased knowledge of precautions;Impaired sensation;Pain PT Treatment Interventions: DME instruction;Gait training;Stair training;Functional mobility training;Therapeutic activities;Therapeutic exercise;Patient/family education   PT Goals Acute Rehab PT Goals PT Goal Formulation: With patient/family Time For Goal Achievement: 08/02/12 Potential to Achieve Goals: Good Pt will go Supine/Side to Sit: with modified independence;with HOB 0 degrees PT Goal: Supine/Side to Sit - Progress: Goal set today Pt will go Sit to Supine/Side: with modified independence;with HOB 0 degrees PT Goal: Sit to Supine/Side - Progress: Goal set today Pt will go Sit to Stand: with supervision;with upper extremity assist PT Goal: Sit to Stand - Progress: Goal set today Pt will go Stand to Sit: with supervision;with upper extremity assist PT Goal: Stand to Sit - Progress: Goal set today Pt will Ambulate: >150 feet;with supervision;with least restrictive assistive device PT Goal: Ambulate - Progress: Goal set today Pt will Go Up / Down Stairs: 1-2 stairs;with min assist;with least restrictive assistive device PT Goal: Up/Down Stairs - Progress: Goal set today Pt will Perform Home Exercise Program: with supervision, verbal cues required/provided PT Goal: Perform Home Exercise Program - Progress: Goal set today  Visit Information  Last PT Received On: 07/26/12 Assistance Needed: +1    Subjective Data  Subjective: pt reports she usuallly does not have a problem with  anesthesia Patient Stated Goal: return home in 1-3 days  Prior Functioning  Home Living Lives With: Spouse Available Help at Discharge: Family;Available 24 hours/day Type of Home: House Home Access: Stairs to enter [employer] of Steps: 3 (2+1) Entrance Stairs-Rails: None Home Layout: Two level;Able to live on main level with bedroom/bathroom Bathroom Shower/Tub: Health visitor: Standard Bathroom Accessibility: Yes How Accessible: Accessible via walker Home Adaptive Equipment: Bedside commode/3-in-1;Walker - rolling Prior Function Level of Independence: Independent Able to Take Stairs?: Yes Driving: Yes Vocation: Retired Musician: No difficulties Dominant Hand: Right    Cognition  Overall Cognitive Status: Appears within functional limits for tasks assessed/performed Arousal/Alertness: Awake/alert Orientation Level: Oriented X4 / Intact Behavior During Session: WFL for tasks performed    Extremity/Trunk Assessment Right Lower Extremity Assessment RLE ROM/Strength/Tone: Deficits RLE ROM/Strength/Tone Deficits: knee flexion 5 to 50 (bulky dressing); hip flexion 2+/5, knee extension 2+/5 RLE Sensation: Deficits RLE Sensation Deficits: numbness s/p femoral nerve block RLE Coordination: WFL - gross motor Left Lower Extremity Assessment LLE ROM/Strength/Tone: WFL for tasks assessed LLE Sensation: WFL - Light Touch LLE Coordination: WFL - gross motor Trunk Assessment Trunk Assessment: Normal   Balance    End of Session PT - End of Session Equipment Utilized During Treatment: Gait belt;Right knee immobilizer Activity Tolerance: Treatment limited secondary to medical complications (Comment) (n/v) Patient left: in chair;with call bell/phone within reach;with family/visitor present Nurse Communication: Mobility status;Other (comment) (n/v at EOB; pt requesting lozenges) CPM Right Knee CPM Right Knee: Off Right Knee Flexion  (Degrees): 60  Right Knee Extension (Degrees): 0   GP     Talina Pleitez 07/26/2012, 4:49 PM  Pager [phone]

## 2012-07-26 NOTE — Anesthesia Preprocedure Evaluation (Addendum)
Anesthesia Evaluation  Patient identified by MRN, date of birth, ID band Patient awake    Reviewed: Allergy & Precautions, H&P , NPO status   Airway Mallampati: II      Dental   Pulmonary pneumonia -,  breath sounds clear to auscultation        Cardiovascular hypertension, Pt. on medications Rhythm:Regular Rate:Normal  Cardiac history noted.   Neuro/Psych TIA   GI/Hepatic Neg liver ROS, GERD-  ,  Endo/Other  negative endocrine ROS  Renal/GU negative Renal ROS     Musculoskeletal   Abdominal   Peds  Hematology negative hematology ROS (+)   Anesthesia Other Findings   Reproductive/Obstetrics                          Anesthesia Physical Anesthesia Plan  ASA: III  Anesthesia Plan: General   Post-op Pain Management:    Induction: Intravenous  Airway Management Planned: Oral ETT  Additional Equipment:   Intra-op Plan:   Post-operative Plan: Extubation in OR  Informed Consent:   Plan Discussed with:   Anesthesia Plan Comments:         Anesthesia Quick Evaluation

## 2012-07-26 NOTE — Progress Notes (Signed)
UR COMPLETED  

## 2012-07-26 NOTE — H&P (View-Only) (Signed)
TOTAL KNEE ADMISSION H&P  Patient is being admitted for right total knee arthroplasty.  Subjective:  Chief Complaint:right knee pain.  HPI: Carla Spencer, 71 y.o. female, has a history of pain and functional disability in the right knee due to arthritis and has failed non-surgical conservative treatments for greater than 12 weeks to includeNSAID's and/or analgesics, supervised PT with diminished ADL's post treatment and activity modification.  Onset of symptoms was gradual, with gradually worsening course since that time. The patient noted no past surgery on the right knee(s).  Patient currently rates pain in the right knee(s) at moderate with activity. Patient has night pain, worsening of pain with activity and weight bearing and pain that interferes with activities of daily living.  Patient has evidence of periarticular osteophytes and joint space narrowing by imaging studies.There is no active infection.  Patient Active Problem List   Diagnosis Date Noted  . Sebaceous cyst 05/06/2012  . Memory loss 05/06/2012  . Depression 07/09/2011  . Benign hypertensive heart disease 05/21/2011  . Cystocele 05/21/2011  . Hypertension 04/04/2011  . Other and unspecified hyperlipidemia 03/19/2011  . Thyroid mass 03/19/2011  . ASD (atrial septal defect) 03/19/2011  . Osteoporosis 03/19/2011   Past Medical History  Diagnosis Date  . Vertigo   . Anxiety and depression   . Hyperlipemia   . TIA (transient ischemic attack)     patient on plavix,  saw Dr. Brick at [facility] in [place]  . Arthritis   . 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 John Brick [phone] [facility]  . 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 Pneumonia     hx of as baby  . Urinary tract infection     hx of  . GERD (gastroesophageal reflux disease)   . Neuromuscular disorder     carpal tunnel bilaterally  .  Colitis     hx of  . Cataracts, bilateral   . Hypertension     sees Dr. John Hodgin, [place]  . PFO (patent foramen ovale)     echo 03/21/2005 ([facility]): normal LV systolic function, EF 65-70%, no wall motion abnormalities, SVC and PA appear normal, mild aortic atherosclerosis, LA and LA appendage free of thrombus, small PFO by agitated saline contrast    Past Surgical History  Procedure Date  . Abdominal hysterectomy   . Bladder suspension   . Purse string     Rectum  . Thyroidectomy     Left  . Rectocele repair   . Colonoscopy   . Breast surgery     "breast lumps removed"  . Dilation and curettage of uterus      (Not in a hospital admission) Allergies  Allergen Reactions  . Sulfa Drugs Cross Reactors Rash    History  Substance Use Topics  . Smoking status: Former Smoker  . Smokeless tobacco: Never Used     Comment: smoked 1/4 of pack from 1961-1967  . Alcohol Use: No    Family History  Problem Relation Age of Onset  . Heart disease Mother   . Colon polyps Daughter   . Stomach cancer Father      Review of Systems  Constitutional: Negative.   HENT: Negative.   Eyes: Negative for pain, discharge and redness.  Respiratory: Positive for cough and shortness of breath. Negative for hemoptysis and wheezing.   Cardiovascular: Negative   for chest pain and palpitations.  Gastrointestinal: Negative for heartburn, nausea, vomiting, abdominal pain, diarrhea, constipation, blood in stool and melena.  Genitourinary: Negative for dysuria, urgency, frequency and hematuria.  Musculoskeletal: Positive for joint pain.  Skin: Negative.   Neurological: Positive for dizziness.  Endo/Heme/Allergies: Bruises/bleeds easily.  Psychiatric/Behavioral: The patient is nervous/anxious.     Objective:  Physical Exam  Constitutional: She is oriented to person, place, and time. She appears well-developed and well-nourished. No distress.  HENT:  Head: Normocephalic and  atraumatic.  Nose: Nose normal.  Eyes: Conjunctivae normal and EOM are normal. Pupils are equal, round, and reactive to light.  Neck: Normal range of motion. Neck supple.  Cardiovascular: Normal rate, regular rhythm, normal heart sounds and intact distal pulses.   No murmur heard. Respiratory: Effort normal and breath sounds normal. No respiratory distress. She has no wheezes.  GI: Soft. Bowel sounds are normal. She exhibits no distension. There is no tenderness.  Musculoskeletal:       Right knee: tenderness found. Medial joint line tenderness noted.       NVI, no calf tenderness, intact dorsi/plantarflex ankle, stable ligamentous testing, bakers cyst.  Lymphadenopathy:    She has no cervical adenopathy.  Neurological: She is alert and oriented to person, place, and time. No cranial nerve deficit.  Skin: Skin is warm and dry. No rash noted. No erythema.  Psychiatric: She has a normal mood and affect.    Vital signs: temp 98.4, HR 58, RR 18, BP 139/92  Labs:   Estimated Body mass index is 29.56 kg/(m^2) as calculated from the following:   Height as of 05/06/12: 5' 1.5"(1.562 m).   Weight as of 05/06/12: 159 lb(72.122 kg).   Imaging Review Plain radiographs demonstrate severe degenerative joint disease of the right knee(s). The overall alignment isneutral. The bone quality appears to be good for age and reported activity level.  Assessment/Plan:  End stage arthritis, right knee   The patient history, physical examination, clinical judgment of the provider and imaging studies are consistent with end stage degenerative joint disease of the right knee(s) and total knee arthroplasty is deemed medically necessary. The treatment options including medical management, injection therapy arthroscopy and arthroplasty were discussed at length. The risks and benefits of total knee arthroplasty were presented and reviewed. The risks due to aseptic loosening, infection, stiffness, patella tracking  problems, thromboembolic complications and other imponderables were discussed. The patient acknowledged the explanation, agreed to proceed with the plan and consent was signed. Patient is being admitted for inpatient treatment for surgery, pain control, PT, OT, prophylactic antibiotics, VTE prophylaxis, progressive ambulation and ADL's and discharge planning. The patient is planning to be discharged home with home health services   

## 2012-07-26 NOTE — Op Note (Signed)
NAMEPERCY, COMP NO.:  [id number]  MEDICAL RECORD NO.:  [id number]  LOCATION:  5N26C                        FACILITY:  [facility]  PHYSICIAN:  Dyke Brackett, M.D.    DATE OF BIRTH:  1940/08/27  DATE OF PROCEDURE:  07/26/2012 DATE OF DISCHARGE:                              OPERATIVE REPORT   INDICATION:  A 71 year old, retractable knee pain, osteoarthritis of the right knee thought to be amenable to hospitalization.  PREOPERATIVE DIAGNOSIS:  Osteoarthritis, varus deformity, right knee.  POSTOPERATIVE DIAGNOSIS:  Osteoarthritis, varus deformity, right knee.  OPERATION:  Right total knee replacement, [employer] mobile bearing knee cemented with a size 3 femur, tibia 10 mm bearing, 35 mm patella.  SURGEON:  Dyke Brackett, MD  ASSISTANT:  Margart Sickles, PA-C  TOURNIQUET TIME:  50 minutes.  PROCEDURE:  Sterile prep and drape, exsanguination of leg, inflation of the tourniquet to 350, straight skin incision, medial parapatellar approach to the knee made.  We cut 10 mm off the distal femur and probes at 5 degree valgus cut followed by cutting about 3-4 mm below the most diseased medial compartment, extension gap measured at 10 mm and sized the femur to be a size 3.  Using the flexion gap spacer device, we placed the 2 pins for placement of the anterior-posterior chamfer cuts with the appropriate degree of external rotation with a 10 mm shim.  We then pinned this and then cut the anterior and posterior cuts and chamfer cuts.  Flexion gap measured 10 mm as well.  We excised excess menisci, posterior bone off the back of the knee as well as releasing the PCL.  Tibia was cut, sized to be a 3 with a keel being cut to the tibia.  We then cut the box for the femur, trialed the femur tibia with a size 3, 10 mm bearing, full extension, no instability, good balance noted.  Patella was cut leaving about 13-14 mm of native patella and then all poly patella trials which were  deemed to be acceptable with full extension, good stability, and negligible drawer.  Final components were inserted after copious irrigation of the cement in the doughy state, tibia followed by femur and patella.  We elected to use trial bearing and then again finally decided on the 10 mm bearing being appropriate.  We removed the trial bearing up prior to releasing the tourniquet.  We looked for excess cement, none was noted. We released the tourniquet and no excess bleeding was noted.  All bleeders were coagulated.  Closure was affected with #1 Ethibond, 0, and 2-0 Vicryl, skin clips.  Hemovac drain was placed, exiting superolaterally, taken to recovery room in stable condition.     Dyke Brackett, M.D.     WDC/MEDQ  D:  07/26/2012  T:  07/26/2012  Job:  [id number]

## 2012-07-27 LAB — BASIC METABOLIC PANEL
CO2: 26 mEq/L (ref 19–32)
Chloride: 105 mEq/L (ref 96–112)
GFR calc non Af Amer: 66 mL/min — ABNORMAL LOW (ref >90)
Glucose, Bld: 115 mg/dL — ABNORMAL HIGH (ref 70–99)
Potassium: 4.1 mEq/L (ref 3.5–5.1)
Sodium: 139 mEq/L (ref 135–145)

## 2012-07-27 LAB — CBC
Hemoglobin: 10.4 g/dL — ABNORMAL LOW (ref 12.0–15.0)
MCHC: 33.7 g/dL (ref 30.0–36.0)
RBC: 3.52 MIL/uL — ABNORMAL LOW (ref 3.87–5.11)
WBC: 9.4 10*3/uL (ref 4.0–10.5)

## 2012-07-27 NOTE — Evaluation (Signed)
Occupational Therapy Evaluation Patient Details Name: STOREY STANGELAND MRN: [id number] DOB: 02/08/1941 Today's Date: 07/27/2012 Time: 1914-7829 OT Time Calculation (min): 32 min  OT Assessment / Plan / Recommendation Clinical Impression  71 yo female s/p RT TKA that could benefit from skilled OT acutely. Recommend HHOT for d/c planning    OT Assessment  Patient needs continued OT Services    Follow Up Recommendations  Home health OT    Barriers to Discharge      Equipment Recommendations  3 in 1 bedside comode    Recommendations for Other Services    Frequency  Min 2X/week    Precautions / Restrictions Precautions Precautions: Knee Required Braces or Orthoses: Knee Immobilizer - Right Knee Immobilizer - Right: On when out of bed or walking Restrictions Weight Bearing Restrictions: Yes RLE Weight Bearing: Weight bearing as tolerated   Pertinent Vitals/Pain Decreasing pain since pain medication Pt reports not having pain medication at all during the night    ADL  Eating/Feeding: Modified independent Where Assessed - Eating/Feeding: Chair Grooming: Wash/dry hands;Supervision/safety Where Assessed - Grooming: Unsupported standing Lower Body Dressing: Modified independent Where Assessed - Lower Body Dressing: Unsupported sit to stand (pt able to touch toes with KI on) Toilet Transfer: Min Pension scheme manager Method: Sit to Barista: Raised toilet seat with arms (or 3-in-1 over toilet) (min v/c for hand placement) Toileting - Clothing Manipulation and Hygiene: Min guard Where Assessed - Toileting Clothing Manipulation and Hygiene: Sit to stand from 3-in-1 or toilet Equipment Used: Gait belt;Knee Immobilizer;Rolling walker Transfers/Ambulation Related to ADLs: Pt required Min v/c for RW sequence and safety.pt recognized errors and self correcting at end of session ADL Comments: pt educated on LB dressing, KI positioning, 3n1 use, CPM use, ice for edema  management and walking backward with RW. husband present for all education    OT Diagnosis: Generalized weakness;Acute pain  OT Problem List: Decreased strength;Decreased activity tolerance;Impaired balance (sitting and/or standing);Decreased safety awareness;Decreased knowledge of use of DME or AE;Decreased knowledge of precautions;Pain OT Treatment Interventions: Self-care/ADL training;DME and/or AE instruction;Therapeutic activities;Patient/family education;Balance training   OT Goals Acute Rehab OT Goals OT Goal Formulation: With patient/family Time For Goal Achievement: 08/10/12 Potential to Achieve Goals: Good ADL Goals Pt Will Transfer to Toilet: with modified independence;Ambulation;3-in-1 ADL Goal: Toilet Transfer - Progress: Goal set today Pt Will Perform Tub/Shower Transfer: Shower transfer;with supervision;with DME ADL Goal: Tub/Shower Transfer - Progress: Goal set today Miscellaneous OT Goals Miscellaneous OT Goal #1: Pt will complete bed mobility MOD i as precursor to adls OT Goal: Miscellaneous Goal #1 - Progress: Goal set today  Visit Information  Last OT Received On: 07/27/14 Assistance Needed: +1    Subjective Data  Subjective: "they forgot to give me pain medication. I had to ask for this AM"- pt and husband unhappy about lack of medication Patient Stated Goal: to go home soon   Prior Functioning     Home Living Lives With: Spouse Available Help at Discharge: Family;Available 24 hours/day Type of Home: House Home Access: Stairs to enter [employer] of Steps: 3 Entrance Stairs-Rails: None Home Layout: Two level;Able to live on main level with bedroom/bathroom Bathroom Shower/Tub: Health visitor: Standard Bathroom Accessibility: Yes How Accessible: Accessible via walker Home Adaptive Equipment: Bedside commode/3-in-1;Walker - rolling Prior Function Level of Independence: Independent Able to Take Stairs?: Yes Driving:  Yes Vocation: Retired Musician: No difficulties Dominant Hand: Right         Vision/Perception  Cognition  Overall Cognitive Status: Appears within functional limits for tasks assessed/performed Arousal/Alertness: Awake/alert Orientation Level: Oriented X4 / Intact Behavior During Session: [facility] for tasks performed    Extremity/Trunk Assessment Right Upper Extremity Assessment RUE ROM/Strength/Tone: Within functional levels Left Upper Extremity Assessment LUE ROM/Strength/Tone: Within functional levels Trunk Assessment Trunk Assessment: Normal     Mobility Bed Mobility Bed Mobility: Supine to Sit;Sit to Supine Supine to Sit: 4: Min assist;HOB flat Sit to Supine: 4: Min assist;HOB flat Details for Bed Mobility Assistance: assist with RLE only Transfers Sit to Stand: 4: Min guard;With upper extremity assist;From chair/3-in-1 Stand to Sit: 4: Min guard;With upper extremity assist;To chair/3-in-1 Details for Transfer Assistance: v/c for hand placement           Balance     End of Session OT - End of Session Activity Tolerance: Patient tolerated treatment well Patient left: in chair;with call bell/phone within reach Nurse Communication: Mobility status;Precautions CPM Right Knee CPM Right Knee: On Right Knee Flexion (Degrees): 50  Right Knee Extension (Degrees): 0  Additional Comments: Pt. having increased pain at this time.    GO   Educated on using a belt as leg lifter and CPM use. Pt with CPM setup on the bed for RN / tech staff to assist patient into after lunch completed. Pta Denise informed of session and pt request to use CPM this PM   Lucile Shutters 07/27/2012, 4:23 PM Pager: [phone]

## 2012-07-27 NOTE — Progress Notes (Signed)
Physical Therapy Treatment Patient Details Name: Carla Spencer MRN: [id number] DOB: Jul 18, 1941 Today's Date: 07/27/2012 Time: 8657-8469 PT Time Calculation (min): 49 min  PT Assessment / Plan / Recommendation Comments on Treatment Session  Pt and husband both asking appropriate questions related to progression and d/c concerns.  Pt progressing well with overall mobility and hopeful to d/c tomorrow.    Follow Up Recommendations  Home health PT;Supervision/Assistance - 24 hour     Does the patient have the potential to tolerate intense rehabilitation     Barriers to Discharge        Equipment Recommendations  None recommended by PT    Recommendations for Other Services    Frequency 7X/week   Plan Discharge plan remains appropriate;Frequency remains appropriate    Precautions / Restrictions Precautions Precautions: Knee Required Braces or Orthoses: Knee Immobilizer - Right Knee Immobilizer - Right: On when out of bed or walking Restrictions Weight Bearing Restrictions: Yes RLE Weight Bearing: Weight bearing as tolerated   Pertinent Vitals/Pain 1-4/10 R knee with treatment.  Premedicated and ice provided at end of treatment.     Mobility  Bed Mobility Bed Mobility: Supine to Sit;Sit to Supine Supine to Sit: 4: Min assist;HOB flat Sit to Supine: 4: Min assist;HOB flat Details for Bed Mobility Assistance: assist with RLE only Transfers Transfers: Sit to Stand;Stand to Sit Sit to Stand: 4: Min guard;With upper extremity assist;From bed Stand to Sit: 4: Min guard;With upper extremity assist;To bed Details for Transfer Assistance: cues for hand placement and foot placement for stand-sit Ambulation/Gait Ambulation/Gait Assistance: 4: Min guard Ambulation Distance (Feet): 140 Feet Assistive device: Rolling walker Ambulation/Gait Assistance Details: cues to look upright and for sequence initially.   Gait Pattern: Step-to pattern;Decreased stride length;Antalgic General Gait  Details: Gait progressed to step through pattern as distance increased and increased step length with improved RLE WBing. Stairs: No    Exercises Total Joint Exercises Ankle Circles/Pumps: AROM;Both;15 reps;Supine Quad Sets: AROM;Right;10 reps;Supine Short Arc Quad: AAROM;Right;10 reps;Supine Heel Slides: AAROM;Right;10 reps;Supine       PT Goals Acute Rehab PT Goals PT Goal: Supine/Side to Sit - Progress: Progressing toward goal PT Goal: Sit to Supine/Side - Progress: Progressing toward goal PT Goal: Sit to Stand - Progress: Progressing toward goal PT Goal: Stand to Sit - Progress: Progressing toward goal PT Goal: Ambulate - Progress: Progressing toward goal PT Goal: Perform Home Exercise Program - Progress: Progressing toward goal  Visit Information  Last PT Received On: 07/27/12 Assistance Needed: +1    Subjective Data  Subjective: Pt c/o medial R knee pain   Cognition  Overall Cognitive Status: Appears within functional limits for tasks assessed/performed Arousal/Alertness: Awake/alert Orientation Level: Oriented X4 / Intact Behavior During Session: [facility] for tasks performed        End of Session PT - End of Session Equipment Utilized During Treatment: Gait belt;Right knee immobilizer Activity Tolerance: Patient tolerated treatment well Patient left: in bed;with call bell/phone within reach;with family/visitor present Nurse Communication: Mobility status     Newell Coral 07/27/2012, 3:50 PM  Newell Coral, PTA Acute Rehab [phone] (office)

## 2012-07-27 NOTE — Progress Notes (Signed)
Patient ID: Carla Spencer, female   DOB: [birth year], 71 y.o.   MRN: [id number]     Subjective:  Patient reports pain as moderate.  She states that she is very aware that she had surgery but her pain is controlled.  Objective:   VITALS:   Filed Vitals:   07/26/12 1345 07/26/12 1600 07/26/12 2138 07/27/12 0500  BP:   104/66 121/65  Pulse:   88 85  Temp: 97 F (36.1 C)  98.5 F (36.9 C) 98.6 F (37 C)  TempSrc:   Oral Oral  Resp:  17 18 18   SpO2: 97% 97% 98% 95%    ABD soft Sensation intact distally Dorsiflexion/Plantar flexion intact Incision: dressing C/D/I and no drainage Hemovac Drain active LABS  Results for orders placed during the hospital encounter of 07/26/12 (from the past 24 hour(s))  CBC     Status: Abnormal   Collection Time   07/27/12  6:00 AM      Component Value Range   WBC 9.4  4.0 - 10.5 K/uL   RBC 3.52 (*) 3.87 - 5.11 MIL/uL   Hemoglobin 10.4 (*) 12.0 - 15.0 g/dL   HCT 14.7 (*) 82.9 - 56.2 %   MCV 87.8  78.0 - 100.0 fL   MCH 29.5  26.0 - 34.0 pg   MCHC 33.7  30.0 - 36.0 g/dL   RDW 13.0  86.5 - 78.4 %   Platelets 186  150 - 400 K/uL  BASIC METABOLIC PANEL     Status: Abnormal   Collection Time   07/27/12  6:00 AM      Component Value Range   Sodium 139  135 - 145 mEq/L   Potassium 4.1  3.5 - 5.1 mEq/L   Chloride 105  96 - 112 mEq/L   CO2 26  19 - 32 mEq/L   Glucose, Bld 115 (*) 70 - 99 mg/dL   BUN 13  6 - 23 mg/dL   Creatinine, Ser 6.96  0.50 - 1.10 mg/dL   Calcium 9.3  8.4 - 29.5 mg/dL   GFR calc non Af Amer 66 (*) >90 mL/min   GFR calc Af Amer 76 (*) >90 mL/min    No results found.  Assessment/Plan: 1 Day Post-Op   Principal Problem:  *Osteoarthritis of right knee   Advance diet Up with therapy Plan for dressing change tomorrow  Plan to DC hemovac drain tomorrow   Haskel Khan 07/27/2012, 12:02 PM   Teryl Lucy, MD Cell [phone] Pager [phone]

## 2012-07-28 LAB — CBC
HCT: 29.4 % — ABNORMAL LOW (ref 36.0–46.0)
Hemoglobin: 9.6 g/dL — ABNORMAL LOW (ref 12.0–15.0)
MCH: 29.3 pg (ref 26.0–34.0)
RBC: 3.28 MIL/uL — ABNORMAL LOW (ref 3.87–5.11)

## 2012-07-28 NOTE — Progress Notes (Signed)
Physical Therapy Treatment Patient Details Name: Carla Spencer MRN: [id number] DOB: 04-13-1941 Today's Date: 07/28/2012 Time: 4098-1191 PT Time Calculation (min): 46 min  PT Assessment / Plan / Recommendation Comments on Treatment Session  Pt cont's to progress with mobility at this date.  Performed stairs with husband present & (A)'ing with 2nd trial.      Follow Up Recommendations  Home health PT;Supervision/Assistance - 24 hour     Does the patient have the potential to tolerate intense rehabilitation     Barriers to Discharge        Equipment Recommendations  None recommended by PT    Recommendations for Other Services    Frequency 7X/week   Plan Discharge plan remains appropriate;Frequency remains appropriate    Precautions / Restrictions Precautions Precautions: Knee Required Braces or Orthoses: Knee Immobilizer - Right Knee Immobilizer - Right: On when out of bed or walking Restrictions RLE Weight Bearing: Weight bearing as tolerated       Mobility  Bed Mobility Bed Mobility: Supine to Sit;Sitting - Scoot to Edge of Bed Supine to Sit: 4: Min assist Sitting - Scoot to Edge of Bed: 4: Min guard Details for Bed Mobility Assistance: (A) for RLE.   Transfers Transfers: Sit to Stand;Stand to Sit Sit to Stand: 4: Min guard;With upper extremity assist;From bed;With armrests;From chair/3-in-1 Stand to Sit: 4: Min guard;With upper extremity assist;With armrests;To chair/3-in-1 Details for Transfer Assistance: Cues for safest hand placement & RLE positioning before sitting.   Ambulation/Gait Ambulation/Gait Assistance: 4: Min guard Ambulation Distance (Feet): 140 Feet Assistive device: Rolling walker Ambulation/Gait Assistance Details: Cues to look upright & encouragement to decrease reliance of UE's on RW.   Gait Pattern: Step-to pattern;Decreased stance time - right;Decreased step length - left;Step-through pattern;Decreased weight shift to right Stairs: Yes Stairs  Assistance: 4: Min assist Stairs Assistance Details (indicate cue type and reason): Cues for sequencing & technique.  (A) for balance & RW management.   Stair Management Technique: No rails;Backwards;With walker Number of Stairs: 3  (2x's) Wheelchair Mobility Wheelchair Mobility: No      PT Goals Acute Rehab PT Goals Time For Goal Achievement: 08/02/12 Potential to Achieve Goals: Good Pt will go Supine/Side to Sit: with modified independence;with HOB 0 degrees PT Goal: Supine/Side to Sit - Progress: Progressing toward goal Pt will go Sit to Supine/Side: with modified independence;with HOB 0 degrees Pt will go Sit to Stand: with supervision;with upper extremity assist PT Goal: Sit to Stand - Progress: Progressing toward goal Pt will go Stand to Sit: with supervision;with upper extremity assist PT Goal: Stand to Sit - Progress: Progressing toward goal Pt will Ambulate: >150 feet;with supervision;with least restrictive assistive device PT Goal: Ambulate - Progress: Progressing toward goal Pt will Go Up / Down Stairs: 1-2 stairs;with min assist;with least restrictive assistive device PT Goal: Up/Down Stairs - Progress: Met Pt will Perform Home Exercise Program: with supervision, verbal cues required/provided  Visit Information  Last PT Received On: 07/28/12 Assistance Needed: +1    Subjective Data      Cognition  Overall Cognitive Status: Appears within functional limits for tasks assessed/performed Arousal/Alertness: Awake/alert Orientation Level: Oriented X4 / Intact Behavior During Session: [facility] for tasks performed    Balance     End of Session PT - End of Session Equipment Utilized During Treatment: Gait belt;Right knee immobilizer Activity Tolerance: Patient tolerated treatment well Patient left: in chair;with call bell/phone within reach;with family/visitor present Nurse Communication: Mobility status     Verdell Face,  PTA [phone] 07/28/2012

## 2012-07-28 NOTE — Discharge Summary (Addendum)
Physician Discharge Summary  Patient ID: Carla Spencer MRN: [id number] DOB/AGE: 1940-12-16 71 y.o.  Admit date: 07/26/2012 Discharge date: 07/28/2012  Admission Diagnoses:  Osteoarthritis of right knee  Discharge Diagnoses:  Principal Problem:  *Osteoarthritis of right knee Mild Acute blood loss anemia, observed  Past Medical History  Diagnosis Date  . Vertigo   . Anxiety and depression   . Hyperlipemia   . TIA (transient ischemic attack)     patient on plavix,  saw Dr. Quentin Mulling at [facility].hospital in [place]  . Arthritis   . 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 Melina Modena [phone] [facility]. hospital  . 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 Pneumonia     hx of as baby  . Urinary tract infection     hx of  . GERD (gastroesophageal reflux disease)   . Neuromuscular disorder     carpal tunnel bilaterally  . Colitis     hx of  . Cataracts, bilateral   . Hypertension     se[place] Dr. Delories Heinz, [place]  . PFO (patent foramen ovale)     echo 03/21/2005 [facility]): normal LV systolic function, EF 65-70%, no wall motion abnormalities, SVC and PA appear normal, mild aortic atherosclerosis, LA and LA appendage free of thrombus, small PFO by agitated saline contrast    Surgeries: Procedure(s): TOTAL KNEE ARTHROPLASTY on 07/26/2012   Consultants (if any):    Discharged Condition: Improved  Hospital Course: Carla Spencer is an 71 y.o. female who was admitted 07/26/2012 with a diagnosis of Osteoarthritis of right knee and went to the operating room on 07/26/2012 and underwent the above named procedures.    She was given perioperative antibiotics:  Anti-infectives     Start     Dose/Rate Route Frequency Ordered Stop   07/26/12 1500   ceFAZolin (ANCEF) IVPB 1 g/50 mL premix        1 g 100 mL/hr over 30 Minutes Intravenous Every 6 hours 07/26/12 1354  07/26/12 2205   07/25/12 1441   ceFAZolin (ANCEF) IVPB 2 g/50 mL premix        2 g 100 mL/hr over 30 Minutes Intravenous 60 min pre-op 07/25/12 1441 07/26/12 0755        .  She was given sequential compression devices, early ambulation, and lovenox for DVT prophylaxis.  She benefited maximally from the hospital stay and there were no complications.  Drain pulled and dressing changed POD 2 and was clean.  Recent vital signs:  Filed Vitals:   07/28/12 0800  BP:   Pulse:   Temp:   Resp: 20    Recent laboratory studies:  Lab Results  Component Value Date   HGB 9.6* 07/28/2012   HGB 10.4* 07/27/2012   HGB 14.6 07/19/2012   Lab Results  Component Value Date   WBC 8.7 07/28/2012   PLT 185 07/28/2012   Lab Results  Component Value Date   INR 0.97 07/19/2012   Lab Results  Component Value Date   NA 139 07/27/2012   K 4.1 07/27/2012   CL 105 07/27/2012   CO2 26 07/27/2012   BUN 13 07/27/2012   CREATININE 0.88 07/27/2012   GLUCOSE 115* 07/27/2012    Discharge Medications:     Medication List     As of 07/28/2012  2:28 PM    STOP taking these  medications         clopidogrel 75 MG tablet   Commonly known as: PLAVIX      meloxicam 7.5 MG tablet   Commonly known as: MOBIC      TAKE these medications         amLODipine 2.5 MG tablet   Commonly known as: NORVASC   Take 1 tablet (2.5 mg total) by mouth daily.      atorvastatin 20 MG tablet   Commonly known as: LIPITOR   Take 20 mg by mouth daily.      CALCIUM 1200 PO   Take 1,200 mg by mouth daily.      Calcium-Magnesium-Zinc 333-133-5 MG Tabs   Take 2 tablets by mouth daily. Take 2 tablets by mouth once daily      cholecalciferol 1000 UNITS tablet   Commonly known as: VITAMIN D   Take 1,000 Units by mouth daily.      enoxaparin 30 MG/0.3ML injection   Commonly known as: LOVENOX   Inject 0.3 mLs (30 mg total) into the skin every 12 (twelve) hours.      Fish Oil 1200 MG Caps   Take 1,200 mg by  mouth daily.      losartan 100 MG tablet   Commonly known as: COZAAR   TAKE 1 TABLET BY MOUTH EVERY DAY      methocarbamol 500 MG tablet   Commonly known as: ROBAXIN   Take 1 tablet (500 mg total) by mouth 4 (four) times daily.      oxyCODONE-acetaminophen 5-325 MG per tablet   Commonly known as: PERCOCET/ROXICET   1-2 tabs po q4-6hrs prn pain      PARoxetine 20 MG tablet   Commonly known as: PAXIL   Take 10 mg by mouth daily.        Diagnostic Studies: Dg Chest 2 View  07/19/2012  *RADIOLOGY REPORT*  Clinical Data: Preoperative chest radiograph.  Total knee arthroplasty.  CHEST - 2 VIEW  Comparison: None.  Findings: There is loss of the right heart border.  On the lateral view, there appears to be subsegmental atelectasis in the right middle lobe accounting for the opacity on the frontal view. Otherwise the lungs are clear.  The cardiopericardial silhouette appears within normal limits.  No plain film evidence of adenopathy.  Mild hyperinflation on the lateral view with blunting of the costophrenic angles.  IMPRESSION: Abnormal appearance of the inferior right middle lobe with loss of the right heart border.  Follow-up chest CT, preferably with infusion recommended for further assessment.  Differential considerations include pneumonia, atelectasis and mass lesion.  These results will be called to the ordering clinician or representative by the Radiologist Assistant, and communication documented in the PACS Dashboard.   Original Report Authenticated By: Andreas Newport, M.D.    Ct Chest W Contrast  07/23/2012  *RADIOLOGY REPORT*  Clinical Data: Ex-smoker with abnormal preoperative radiograph for total knee arthroplasty.  Question right middle lobe lesion.  CT CHEST WITH CONTRAST  Technique:  Multidetector CT imaging of the chest was performed following the standard protocol during bolus administration of intravenous contrast.  Contrast: 75mL OMNIPAQUE IOHEXOL 300 MG/ML  SOLN  Comparison:  Chest radiographs 07/19/2012.  Findings: There are postsurgical changes at the thoracic inlet on the left consistent with prior resection of the left thyroid lobe. The right thyroid lobe appears normal.  There are no enlarged mediastinal or hilar lymph nodes.  There is scattered mild atherosclerosis of the great vessels and aorta.  There is no pleural or pericardial effusion.  There is minimal right middle lobe atelectasis.  There is prominent epicardial fat adjacent to the right heart border accounting for the radiographic opacity.  There are scattered tiny nodules in both lungs, some associated with the fissures.  These are seen in the right lung on images 26, 29 and 32 and in the left lung on images 11, 19 and 24. All measure less than 3 mm in diameter.  Images through the upper abdomen demonstrate a probable 7 mm cyst in the left hepatic lobe on image 59.  There is no adrenal mass. The spleen does not appear significantly enlarged, although demonstrates multiple hypodensities measuring up to 1.3 cm in diameter on image 54.  IMPRESSION:  1.  No evidence of right perihilar mass or significant middle lobe collapse.  Prominent epicardial fat adjacent to the right heart border accounts for the radiographic finding. 2.  Scattered tiny pulmonary nodules bilaterally, likely postinflammatory. If the patient is at high risk for bronchogenic carcinoma, follow-up chest CT at 1 year is recommended.  If the patient is at low risk, no follow-up is needed.  This recommendation follows the consensus statement: Guidelines for Management of Small Pulmonary Nodules Detected on CT Scans:  A Statement from the Fleischner Society as published in Radiology 2005; 237:395-400. 3.  Nonspecific low density splenic lesions. In a patient without a history of malignancy, these are likely benign.  Follow-up is recommended to assess stability in 6-12 months.  That could be performed in conjunction with chest CT if deemed necessary.   Original  Report Authenticated By: Carey Bullocks, M.D.    Mm Digital Screening  07/26/2012  *RADIOLOGY REPORT*  Clinical Data: Screening.  DIGITAL BILATERAL SCREENING MAMMOGRAM WITH CAD  Comparison:  Previous exams.  FINDINGS:  ACR Breast Density Category 2: There is a scattered fibroglandular pattern.  No suspicious masses, architectural distortion, or calcifications are present.  Images were processed with CAD.  IMPRESSION: No mammographic evidence of malignancy.  A result letter of this screening mammogram will be mailed directly to the patient.  RECOMMENDATION: Screening mammogram in one year. (Code:SM-B-01Y)  BI-RADS CATEGORY 1:  Negative.   Original Report Authenticated By: Christiana Pellant, M.D.     Disposition: 01-Home or Self Care      Discharge Orders    Future Appointments: Provider: Department: Dept Phone: Center:   11/04/2012 10:00 AM Edwyna Perfect, MD [facility]  [facility] [phone] [facility]      Follow-up Information    Schedule an appointment as soon as possible for a visit with CAFFREY JR,W D, MD. ( to be seen on 08/09/12 or as previously scheduled)    Contact information:   [address] [place] [address] [phone]           Signed: Eulas Post 07/28/2012, 2:28 PM

## 2012-07-28 NOTE — Progress Notes (Signed)
Discharge instructions and prescriptions reviewed with patient and husband utilizing teach-back technique.

## 2012-07-29 ENCOUNTER — Encounter (HOSPITAL_COMMUNITY): Payer: Self-pay | Admitting: Orthopedic Surgery

## 2012-07-29 DIAGNOSIS — M159 Polyosteoarthritis, unspecified: Secondary | ICD-10-CM | POA: Diagnosis not present

## 2012-07-29 DIAGNOSIS — I2510 Atherosclerotic heart disease of native coronary artery without angina pectoris: Secondary | ICD-10-CM | POA: Diagnosis not present

## 2012-07-29 DIAGNOSIS — IMO0001 Reserved for inherently not codable concepts without codable children: Secondary | ICD-10-CM | POA: Diagnosis not present

## 2012-07-29 DIAGNOSIS — M810 Age-related osteoporosis without current pathological fracture: Secondary | ICD-10-CM | POA: Diagnosis not present

## 2012-07-29 DIAGNOSIS — F329 Major depressive disorder, single episode, unspecified: Secondary | ICD-10-CM | POA: Diagnosis not present

## 2012-07-29 DIAGNOSIS — Z471 Aftercare following joint replacement surgery: Secondary | ICD-10-CM | POA: Diagnosis not present

## 2012-07-30 DIAGNOSIS — M810 Age-related osteoporosis without current pathological fracture: Secondary | ICD-10-CM | POA: Diagnosis not present

## 2012-07-30 DIAGNOSIS — IMO0001 Reserved for inherently not codable concepts without codable children: Secondary | ICD-10-CM | POA: Diagnosis not present

## 2012-07-30 DIAGNOSIS — F329 Major depressive disorder, single episode, unspecified: Secondary | ICD-10-CM | POA: Diagnosis not present

## 2012-07-30 DIAGNOSIS — M159 Polyosteoarthritis, unspecified: Secondary | ICD-10-CM | POA: Diagnosis not present

## 2012-07-30 DIAGNOSIS — Z471 Aftercare following joint replacement surgery: Secondary | ICD-10-CM | POA: Diagnosis not present

## 2012-07-30 DIAGNOSIS — I2510 Atherosclerotic heart disease of native coronary artery without angina pectoris: Secondary | ICD-10-CM | POA: Diagnosis not present

## 2012-07-31 DIAGNOSIS — M159 Polyosteoarthritis, unspecified: Secondary | ICD-10-CM | POA: Diagnosis not present

## 2012-07-31 DIAGNOSIS — IMO0001 Reserved for inherently not codable concepts without codable children: Secondary | ICD-10-CM | POA: Diagnosis not present

## 2012-07-31 DIAGNOSIS — I2510 Atherosclerotic heart disease of native coronary artery without angina pectoris: Secondary | ICD-10-CM | POA: Diagnosis not present

## 2012-07-31 DIAGNOSIS — M810 Age-related osteoporosis without current pathological fracture: Secondary | ICD-10-CM | POA: Diagnosis not present

## 2012-07-31 DIAGNOSIS — Z471 Aftercare following joint replacement surgery: Secondary | ICD-10-CM | POA: Diagnosis not present

## 2012-07-31 DIAGNOSIS — F329 Major depressive disorder, single episode, unspecified: Secondary | ICD-10-CM | POA: Diagnosis not present

## 2012-08-01 DIAGNOSIS — M159 Polyosteoarthritis, unspecified: Secondary | ICD-10-CM | POA: Diagnosis not present

## 2012-08-01 DIAGNOSIS — IMO0001 Reserved for inherently not codable concepts without codable children: Secondary | ICD-10-CM | POA: Diagnosis not present

## 2012-08-01 DIAGNOSIS — M810 Age-related osteoporosis without current pathological fracture: Secondary | ICD-10-CM | POA: Diagnosis not present

## 2012-08-01 DIAGNOSIS — Z471 Aftercare following joint replacement surgery: Secondary | ICD-10-CM | POA: Diagnosis not present

## 2012-08-01 DIAGNOSIS — F329 Major depressive disorder, single episode, unspecified: Secondary | ICD-10-CM | POA: Diagnosis not present

## 2012-08-01 DIAGNOSIS — I2510 Atherosclerotic heart disease of native coronary artery without angina pectoris: Secondary | ICD-10-CM | POA: Diagnosis not present

## 2012-08-02 DIAGNOSIS — F329 Major depressive disorder, single episode, unspecified: Secondary | ICD-10-CM | POA: Diagnosis not present

## 2012-08-02 DIAGNOSIS — M159 Polyosteoarthritis, unspecified: Secondary | ICD-10-CM | POA: Diagnosis not present

## 2012-08-02 DIAGNOSIS — I2510 Atherosclerotic heart disease of native coronary artery without angina pectoris: Secondary | ICD-10-CM | POA: Diagnosis not present

## 2012-08-02 DIAGNOSIS — IMO0001 Reserved for inherently not codable concepts without codable children: Secondary | ICD-10-CM | POA: Diagnosis not present

## 2012-08-02 DIAGNOSIS — Z471 Aftercare following joint replacement surgery: Secondary | ICD-10-CM | POA: Diagnosis not present

## 2012-08-02 DIAGNOSIS — M810 Age-related osteoporosis without current pathological fracture: Secondary | ICD-10-CM | POA: Diagnosis not present

## 2012-08-05 DIAGNOSIS — F329 Major depressive disorder, single episode, unspecified: Secondary | ICD-10-CM | POA: Diagnosis not present

## 2012-08-05 DIAGNOSIS — M810 Age-related osteoporosis without current pathological fracture: Secondary | ICD-10-CM | POA: Diagnosis not present

## 2012-08-05 DIAGNOSIS — I2510 Atherosclerotic heart disease of native coronary artery without angina pectoris: Secondary | ICD-10-CM | POA: Diagnosis not present

## 2012-08-05 DIAGNOSIS — Z471 Aftercare following joint replacement surgery: Secondary | ICD-10-CM | POA: Diagnosis not present

## 2012-08-05 DIAGNOSIS — IMO0001 Reserved for inherently not codable concepts without codable children: Secondary | ICD-10-CM | POA: Diagnosis not present

## 2012-08-05 DIAGNOSIS — M159 Polyosteoarthritis, unspecified: Secondary | ICD-10-CM | POA: Diagnosis not present

## 2012-08-08 DIAGNOSIS — I2510 Atherosclerotic heart disease of native coronary artery without angina pectoris: Secondary | ICD-10-CM | POA: Diagnosis not present

## 2012-08-08 DIAGNOSIS — Z471 Aftercare following joint replacement surgery: Secondary | ICD-10-CM | POA: Diagnosis not present

## 2012-08-08 DIAGNOSIS — M159 Polyosteoarthritis, unspecified: Secondary | ICD-10-CM | POA: Diagnosis not present

## 2012-08-08 DIAGNOSIS — F329 Major depressive disorder, single episode, unspecified: Secondary | ICD-10-CM | POA: Diagnosis not present

## 2012-08-08 DIAGNOSIS — IMO0001 Reserved for inherently not codable concepts without codable children: Secondary | ICD-10-CM | POA: Diagnosis not present

## 2012-08-08 DIAGNOSIS — M810 Age-related osteoporosis without current pathological fracture: Secondary | ICD-10-CM | POA: Diagnosis not present

## 2012-08-09 DIAGNOSIS — I2510 Atherosclerotic heart disease of native coronary artery without angina pectoris: Secondary | ICD-10-CM | POA: Diagnosis not present

## 2012-08-09 DIAGNOSIS — IMO0001 Reserved for inherently not codable concepts without codable children: Secondary | ICD-10-CM | POA: Diagnosis not present

## 2012-08-09 DIAGNOSIS — Z471 Aftercare following joint replacement surgery: Secondary | ICD-10-CM | POA: Diagnosis not present

## 2012-08-09 DIAGNOSIS — M159 Polyosteoarthritis, unspecified: Secondary | ICD-10-CM | POA: Diagnosis not present

## 2012-08-09 DIAGNOSIS — F329 Major depressive disorder, single episode, unspecified: Secondary | ICD-10-CM | POA: Diagnosis not present

## 2012-08-09 DIAGNOSIS — M810 Age-related osteoporosis without current pathological fracture: Secondary | ICD-10-CM | POA: Diagnosis not present

## 2012-08-09 DIAGNOSIS — Z96659 Presence of unspecified artificial knee joint: Secondary | ICD-10-CM | POA: Diagnosis not present

## 2012-08-12 DIAGNOSIS — F329 Major depressive disorder, single episode, unspecified: Secondary | ICD-10-CM | POA: Diagnosis not present

## 2012-08-12 DIAGNOSIS — I2510 Atherosclerotic heart disease of native coronary artery without angina pectoris: Secondary | ICD-10-CM | POA: Diagnosis not present

## 2012-08-12 DIAGNOSIS — Z471 Aftercare following joint replacement surgery: Secondary | ICD-10-CM | POA: Diagnosis not present

## 2012-08-12 DIAGNOSIS — M810 Age-related osteoporosis without current pathological fracture: Secondary | ICD-10-CM | POA: Diagnosis not present

## 2012-08-12 DIAGNOSIS — M159 Polyosteoarthritis, unspecified: Secondary | ICD-10-CM | POA: Diagnosis not present

## 2012-08-12 DIAGNOSIS — IMO0001 Reserved for inherently not codable concepts without codable children: Secondary | ICD-10-CM | POA: Diagnosis not present

## 2012-08-13 ENCOUNTER — Ambulatory Visit
Admission: RE | Admit: 2012-08-13 | Discharge: 2012-08-13 | Disposition: A | Discharge status: Home / Self Care | Payer: Medicare Other | Source: Ambulatory Visit | Attending: Orthopedic Surgery | Admitting: Orthopedic Surgery

## 2012-08-13 ENCOUNTER — Other Ambulatory Visit: Payer: Self-pay | Admitting: Orthopedic Surgery

## 2012-08-13 DIAGNOSIS — M79661 Pain in right lower leg: Secondary | ICD-10-CM

## 2012-08-13 DIAGNOSIS — M7989 Other specified soft tissue disorders: Secondary | ICD-10-CM

## 2012-08-13 DIAGNOSIS — M810 Age-related osteoporosis without current pathological fracture: Secondary | ICD-10-CM | POA: Diagnosis not present

## 2012-08-13 DIAGNOSIS — IMO0001 Reserved for inherently not codable concepts without codable children: Secondary | ICD-10-CM | POA: Diagnosis not present

## 2012-08-13 DIAGNOSIS — F329 Major depressive disorder, single episode, unspecified: Secondary | ICD-10-CM | POA: Diagnosis not present

## 2012-08-13 DIAGNOSIS — Z471 Aftercare following joint replacement surgery: Secondary | ICD-10-CM | POA: Diagnosis not present

## 2012-08-13 DIAGNOSIS — M159 Polyosteoarthritis, unspecified: Secondary | ICD-10-CM | POA: Diagnosis not present

## 2012-08-13 DIAGNOSIS — M79609 Pain in unspecified limb: Secondary | ICD-10-CM | POA: Diagnosis not present

## 2012-08-13 DIAGNOSIS — I2510 Atherosclerotic heart disease of native coronary artery without angina pectoris: Secondary | ICD-10-CM | POA: Diagnosis not present

## 2012-08-15 ENCOUNTER — Ambulatory Visit (INDEPENDENT_AMBULATORY_CARE_PROVIDER_SITE_OTHER): Payer: Medicare Other | Admitting: Internal Medicine

## 2012-08-15 ENCOUNTER — Encounter: Payer: Self-pay | Admitting: Internal Medicine

## 2012-08-15 VITALS — BP 165/85 | HR 68 | Temp 98.2°F | Resp 14 | Wt 155.8 lb

## 2012-08-15 DIAGNOSIS — R110 Nausea: Secondary | ICD-10-CM

## 2012-08-15 MED ORDER — TRAMADOL HCL 50 MG PO TABS: 50.0000 mg | ORAL_TABLET | Freq: Three times a day (TID) | ORAL | Status: DC | PRN

## 2012-08-15 NOTE — Progress Notes (Signed)
  Subjective:    Patient ID: Carla Spencer, female    DOB: 1941-08-07, 72 y.o.   MRN: [id number]  HPI Pt presents to clinic for evaluation of nausea. Recently is s/p right TKR. Due to muscle pain/spasms was attempted on robaxin during hospital but had significant side effects. Was discharged on robaxin prn and percocet. Has been taking 1/2 dose robaxin and decreasing interval of percocet. Notes diffuse complaints including nausea, decreased appetite, anxiety with associated palpitations, intermittent dizziness, insomnia and thoughts of passed relatives. Total time of visit ~29 minutes of which >50% spent in counseling.  Past Medical History  Diagnosis Date  . Vertigo   . Anxiety and depression   . Hyperlipemia   . TIA (transient ischemic attack)     patient on plavix,  saw Dr. Quentin Mulling at [facility].hospital in [place]  . Arthritis   . 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 Melina Modena [phone] [facility]. hospital  . 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 Pneumonia     hx of as baby  . Urinary tract infection     hx of  . GERD (gastroesophageal reflux disease)   . Neuromuscular disorder     carpal tunnel bilaterally  . Colitis     hx of  . Cataracts, bilateral   . Hypertension     se[place] Dr. Delories Heinz, [place]  . PFO (patent foramen ovale)     echo 03/21/2005 [facility]): normal LV systolic function, EF 65-70%, no wall motion abnormalities, SVC and PA appear normal, mild aortic atherosclerosis, LA and LA appendage free of thrombus, small PFO by agitated saline contrast   Past Surgical History  Procedure Date  . Abdominal hysterectomy   . Bladder suspension   . Purse string     Rectum  . Thyroidectomy     Left  . Rectocele repair   . Colonoscopy   . Breast surgery     "breast lumps removed"  . Dilation and curettage of uterus   . Total knee arthroplasty 07/26/2012     Procedure: TOTAL KNEE ARTHROPLASTY;  Surgeon: Thera Flake., MD;  Location: [facility] OR;  Service: Orthopedics;  Laterality: Right;    reports that she has quit smoking. She has never used smokeless tobacco. She reports that she does not drink alcohol or use illicit drugs. family history includes Colon polyps in her daughter; Heart disease in her mother; and Stomach cancer in her father. Allergies  Allergen Reactions  . Sulfa Drugs Cross Reactors Rash     Review of Systems see hpi     Objective:   Physical Exam  Nursing note and vitals reviewed. Constitutional: She appears well-developed and well-nourished. No distress.  HENT:  Head: Normocephalic.  Eyes: Conjunctivae normal are normal. No scleral icterus.  Neurological: She is alert.  Skin: She is not diaphoretic.  Psychiatric: She has a normal mood and affect. Her behavior is normal.          Assessment & Plan:

## 2012-08-15 NOTE — Assessment & Plan Note (Signed)
With multiple multi-system associated complaints. Suspect medication side effect. Stop robaxin. If sx's persist change percocet to ultram. Attempt otc sleep aid prn. Follow up closely if sx's do not resolve with medication changes.

## 2012-08-16 DIAGNOSIS — IMO0001 Reserved for inherently not codable concepts without codable children: Secondary | ICD-10-CM | POA: Diagnosis not present

## 2012-08-16 DIAGNOSIS — M810 Age-related osteoporosis without current pathological fracture: Secondary | ICD-10-CM | POA: Diagnosis not present

## 2012-08-16 DIAGNOSIS — Z471 Aftercare following joint replacement surgery: Secondary | ICD-10-CM | POA: Diagnosis not present

## 2012-08-16 DIAGNOSIS — I2510 Atherosclerotic heart disease of native coronary artery without angina pectoris: Secondary | ICD-10-CM | POA: Diagnosis not present

## 2012-08-16 DIAGNOSIS — M159 Polyosteoarthritis, unspecified: Secondary | ICD-10-CM | POA: Diagnosis not present

## 2012-08-16 DIAGNOSIS — F329 Major depressive disorder, single episode, unspecified: Secondary | ICD-10-CM | POA: Diagnosis not present

## 2012-08-21 DIAGNOSIS — Z96659 Presence of unspecified artificial knee joint: Secondary | ICD-10-CM | POA: Diagnosis not present

## 2012-08-21 DIAGNOSIS — Z471 Aftercare following joint replacement surgery: Secondary | ICD-10-CM | POA: Diagnosis not present

## 2012-08-21 DIAGNOSIS — M1710 Unilateral primary osteoarthritis, unspecified knee: Secondary | ICD-10-CM | POA: Diagnosis not present

## 2012-08-29 ENCOUNTER — Other Ambulatory Visit: Payer: Self-pay | Admitting: Endocrinology

## 2012-08-29 DIAGNOSIS — E049 Nontoxic goiter, unspecified: Secondary | ICD-10-CM

## 2012-09-13 DIAGNOSIS — M25669 Stiffness of unspecified knee, not elsewhere classified: Secondary | ICD-10-CM | POA: Diagnosis not present

## 2012-09-13 DIAGNOSIS — Z471 Aftercare following joint replacement surgery: Secondary | ICD-10-CM | POA: Diagnosis not present

## 2012-09-13 DIAGNOSIS — Z96659 Presence of unspecified artificial knee joint: Secondary | ICD-10-CM | POA: Diagnosis not present

## 2012-09-13 DIAGNOSIS — R269 Unspecified abnormalities of gait and mobility: Secondary | ICD-10-CM | POA: Diagnosis not present

## 2012-09-17 DIAGNOSIS — R269 Unspecified abnormalities of gait and mobility: Secondary | ICD-10-CM | POA: Diagnosis not present

## 2012-09-17 DIAGNOSIS — Z96659 Presence of unspecified artificial knee joint: Secondary | ICD-10-CM | POA: Diagnosis not present

## 2012-09-20 DIAGNOSIS — M25669 Stiffness of unspecified knee, not elsewhere classified: Secondary | ICD-10-CM | POA: Diagnosis not present

## 2012-09-20 DIAGNOSIS — Z96659 Presence of unspecified artificial knee joint: Secondary | ICD-10-CM | POA: Diagnosis not present

## 2012-09-20 DIAGNOSIS — R269 Unspecified abnormalities of gait and mobility: Secondary | ICD-10-CM | POA: Diagnosis not present

## 2012-09-23 ENCOUNTER — Encounter: Payer: Self-pay | Admitting: Family Medicine

## 2012-09-23 ENCOUNTER — Ambulatory Visit (INDEPENDENT_AMBULATORY_CARE_PROVIDER_SITE_OTHER): Payer: Medicare Other | Admitting: Family Medicine

## 2012-09-23 VITALS — BP 160/88 | HR 69 | Temp 100.0°F | Ht 61.5 in | Wt 147.0 lb

## 2012-09-23 DIAGNOSIS — F411 Generalized anxiety disorder: Secondary | ICD-10-CM

## 2012-09-23 DIAGNOSIS — E042 Nontoxic multinodular goiter: Secondary | ICD-10-CM

## 2012-09-23 DIAGNOSIS — F331 Major depressive disorder, recurrent, moderate: Secondary | ICD-10-CM | POA: Diagnosis not present

## 2012-09-23 DIAGNOSIS — F341 Dysthymic disorder: Secondary | ICD-10-CM | POA: Diagnosis not present

## 2012-09-23 DIAGNOSIS — F5105 Insomnia due to other mental disorder: Secondary | ICD-10-CM

## 2012-09-23 DIAGNOSIS — F489 Nonpsychotic mental disorder, unspecified: Secondary | ICD-10-CM

## 2012-09-23 MED ORDER — CLONAZEPAM 1 MG PO TABS: ORAL_TABLET | ORAL | Status: DC

## 2012-09-23 MED ORDER — PAROXETINE HCL 40 MG PO TABS: 40.0000 mg | ORAL_TABLET | ORAL | Status: DC
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
## 2012-09-26 ENCOUNTER — Encounter: Payer: Self-pay | Admitting: Family Medicine

## 2012-09-26 DIAGNOSIS — F5105 Insomnia due to other mental disorder: Secondary | ICD-10-CM | POA: Insufficient documentation

## 2012-09-26 DIAGNOSIS — F411 Generalized anxiety disorder: Secondary | ICD-10-CM | POA: Insufficient documentation

## 2012-09-26 DIAGNOSIS — F331 Major depressive disorder, recurrent, moderate: Secondary | ICD-10-CM | POA: Insufficient documentation

## 2012-09-26 DIAGNOSIS — E042 Nontoxic multinodular goiter: Secondary | ICD-10-CM | POA: Insufficient documentation

## 2012-09-26 NOTE — Assessment & Plan Note (Signed)
This is her most prominent problem. She has some features of PTSD--the frequent reliving of a traumatic event--see HPI. She does have major depression as well, moderate, recurrent.  Her insomnia is secondary to her psychological condition and should resolve with treatment of these. Refer to [facility] for initiation of counseling--ordered referral today. Increase paxil to 40mg  qhs.  Start clonazepam 1mg , 1/2-1 tab po q12h prn.  Therapeutic expectations and side effect profile of medication discussed today.  Patient's questions answered.

## 2012-09-26 NOTE — Assessment & Plan Note (Signed)
She is s/p biopsy that was apparently indeterminate, has been euthyroid. Will check TSH today to make sure thyroid abnormality is not contributing to her present problems.

## 2012-09-27 DIAGNOSIS — R269 Unspecified abnormalities of gait and mobility: Secondary | ICD-10-CM | POA: Diagnosis not present

## 2012-09-27 DIAGNOSIS — Z96659 Presence of unspecified artificial knee joint: Secondary | ICD-10-CM | POA: Diagnosis not present

## 2012-09-27 DIAGNOSIS — M25669 Stiffness of unspecified knee, not elsewhere classified: Secondary | ICD-10-CM | POA: Diagnosis not present

## 2012-10-07 ENCOUNTER — Ambulatory Visit (INDEPENDENT_AMBULATORY_CARE_PROVIDER_SITE_OTHER): Payer: Medicare Other | Admitting: Family Medicine

## 2012-10-07 ENCOUNTER — Encounter: Payer: Self-pay | Admitting: Family Medicine

## 2012-10-07 VITALS — BP 132/80 | HR 64 | Ht 61.5 in | Wt 151.0 lb

## 2012-10-07 DIAGNOSIS — F331 Major depressive disorder, recurrent, moderate: Secondary | ICD-10-CM

## 2012-10-07 DIAGNOSIS — F341 Dysthymic disorder: Secondary | ICD-10-CM | POA: Diagnosis not present

## 2012-10-07 DIAGNOSIS — F5105 Insomnia due to other mental disorder: Secondary | ICD-10-CM | POA: Diagnosis not present

## 2012-10-07 DIAGNOSIS — F418 Other specified anxiety disorders: Secondary | ICD-10-CM

## 2012-10-07 DIAGNOSIS — F489 Nonpsychotic mental disorder, unspecified: Secondary | ICD-10-CM | POA: Diagnosis not present

## 2012-10-07 DIAGNOSIS — F411 Generalized anxiety disorder: Secondary | ICD-10-CM

## 2012-10-07 MED ORDER — CLONAZEPAM 1 MG PO TABS: ORAL_TABLET | ORAL | Status: DC

## 2012-10-07 MED ORDER — PAROXETINE HCL 40 MG PO TABS: 40.0000 mg | ORAL_TABLET | ORAL | Status: DC

## 2012-10-07 NOTE — Assessment & Plan Note (Addendum)
Stable.  Minimal improvement and certainly no worsening--has only been on the increased dose of paxil for 2 wks. Continue paxil 40mg  qd. She will still start counseling but will wait until April to get this started.

## 2012-10-07 NOTE — Assessment & Plan Note (Signed)
Much improved.  Continue clonazepam 1/2-1 of the 1mg  tabs qhs.

## 2012-10-07 NOTE — Assessment & Plan Note (Signed)
Stable.  Continue paxil 40mg  qd and 1/2-1 of clonazepam qAM.

## 2012-10-07 NOTE — Progress Notes (Signed)
OFFICE NOTE  10/07/2012  CC:  Chief Complaint  Patient presents with  . Follow-up    GAD     HPI: Patient is a 72 y.o. Caucasian female who is here for 2 wk f/u anxiety, depression, insomnia. Last visit we increased her paxil to 40mg  qd, started clonazepam hs, and referred her to [facility] for initiation of counseling.  She feels quite a bit better, esp with regard to anxiety and sleep.  Takes 1/2 of clonazepam pill qAM and qhs.  Gets full 8 hours of sleep a night now and feels much improved.  No side effects of meds.  Set up appt at [facility] that is coming up soon but they will likely reschedule this for April. Son from [place] has been in town recently and she has spent time with grandchildren lately and this has lifted her spirits some.    Pertinent PMH:  Past Medical History  Diagnosis Date  . Vertigo   . Anxiety and depression   . Hyperlipemia   . TIA (transient ischemic attack)     patient on plavix,  saw Dr. Quentin Mulling at [facility].hospital in [place]  . Arthritis   . PFO (patent foramen ovale) echo 03/21/05    "hole in heart" saw Dr. Melina Modena [phone] [facility] hospital  . 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 Urinary tract infection     hx of  . GERD (gastroesophageal reflux disease)   . Bilateral carpal tunnel syndrome   . Colitis     hx of  . Cataracts, bilateral   . Hypertension     MEDS:  Outpatient Prescriptions Prior to Visit  Medication Sig Dispense Refill  . amLODipine (NORVASC) 2.5 MG tablet Take 1 tablet (2.5 mg total) by mouth daily.  90 tablet  3  . atorvastatin (LIPITOR) 20 MG tablet Take 20 mg by mouth daily.      . clonazePAM (KLONOPIN) 1 MG tablet 1/2-1 tab po q12h prn anxiety and for sleep hs  60 tablet  0  . clopidogrel (PLAVIX) 75 MG tablet Take 75 mg by mouth daily.      Marland Kitchen losartan (COZAAR) 100 MG tablet TAKE 1 TABLET BY MOUTH EVERY DAY  90 tablet  0  . Multiple  Vitamins-Minerals (WOMENS MULTIVITAMIN PLUS) TABS Take by mouth. With Calcium, Zinc & Iron.      . Omega-3 Fatty Acids (FISH OIL) 1200 MG CAPS Take 1,200 mg by mouth daily.        Marland Kitchen PARoxetine (PAXIL) 40 MG tablet Take 1 tablet (40 mg total) by mouth every morning.  30 tablet  1  . methocarbamol (ROBAXIN) 500 MG tablet Take 1 tablet (500 mg total) by mouth 4 (four) times daily.  60 tablet  0   No facility-administered medications prior to visit.    PE: Blood pressure 132/80, pulse 64, height 5' 1.5" (1.562 m), weight 151 lb (68.493 kg). Gen: Alert, well appearing.  Patient is oriented to person, place, time, and situation. AFFECT: pleasant, lucid thought and speech. No further exam today.  IMPRESSION AND PLAN:  Insomnia secondary to depression with anxiety Much improved.  Continue clonazepam 1/2-1 of the 1mg  tabs qhs.  GAD (generalized anxiety disorder) Stable.  Continue paxil 40mg  qd and 1/2-1 of clonazepam qAM.  Major depressive disorder, recurrent episode, moderate Stable.  Minimal improvement and certainly no worsening--has only been on the increased dose of paxil for  2 wks. Continue paxil 40mg  qd. She will still start counseling but will wait until April to get this started.   An After Visit Summary was printed and given to the patient.   FOLLOW UP: 73mo

## 2012-10-08 ENCOUNTER — Ambulatory Visit: Payer: Medicare Other | Admitting: Family Medicine

## 2012-10-09 DIAGNOSIS — R269 Unspecified abnormalities of gait and mobility: Secondary | ICD-10-CM | POA: Diagnosis not present

## 2012-10-09 DIAGNOSIS — Z96659 Presence of unspecified artificial knee joint: Secondary | ICD-10-CM | POA: Diagnosis not present

## 2012-10-09 DIAGNOSIS — M25669 Stiffness of unspecified knee, not elsewhere classified: Secondary | ICD-10-CM | POA: Diagnosis not present

## 2012-10-10 ENCOUNTER — Ambulatory Visit: Payer: Medicare Other | Admitting: Psychology

## 2012-10-11 DIAGNOSIS — Z96659 Presence of unspecified artificial knee joint: Secondary | ICD-10-CM | POA: Diagnosis not present

## 2012-10-11 DIAGNOSIS — R269 Unspecified abnormalities of gait and mobility: Secondary | ICD-10-CM | POA: Diagnosis not present

## 2012-10-11 DIAGNOSIS — M25669 Stiffness of unspecified knee, not elsewhere classified: Secondary | ICD-10-CM | POA: Diagnosis not present

## 2012-10-16 DIAGNOSIS — R269 Unspecified abnormalities of gait and mobility: Secondary | ICD-10-CM | POA: Diagnosis not present

## 2012-10-16 DIAGNOSIS — M25669 Stiffness of unspecified knee, not elsewhere classified: Secondary | ICD-10-CM | POA: Diagnosis not present

## 2012-10-16 DIAGNOSIS — Z96659 Presence of unspecified artificial knee joint: Secondary | ICD-10-CM | POA: Diagnosis not present

## 2012-10-21 ENCOUNTER — Other Ambulatory Visit: Payer: Self-pay | Admitting: Internal Medicine

## 2012-10-21 NOTE — Telephone Encounter (Signed)
eScribe request for refill on LOSARTAN Last filled - 07/06/12, #90 X 0 Last seen on - 10/07/12 Follow up - 11/04/12 RX sent \

## 2012-10-23 DIAGNOSIS — Z96659 Presence of unspecified artificial knee joint: Secondary | ICD-10-CM | POA: Diagnosis not present

## 2012-10-23 DIAGNOSIS — R269 Unspecified abnormalities of gait and mobility: Secondary | ICD-10-CM | POA: Diagnosis not present

## 2012-10-23 DIAGNOSIS — M25669 Stiffness of unspecified knee, not elsewhere classified: Secondary | ICD-10-CM | POA: Diagnosis not present

## 2012-10-24 DIAGNOSIS — Z96659 Presence of unspecified artificial knee joint: Secondary | ICD-10-CM | POA: Diagnosis not present

## 2012-10-24 DIAGNOSIS — Z471 Aftercare following joint replacement surgery: Secondary | ICD-10-CM | POA: Diagnosis not present

## 2012-10-25 DIAGNOSIS — R269 Unspecified abnormalities of gait and mobility: Secondary | ICD-10-CM | POA: Diagnosis not present

## 2012-10-25 DIAGNOSIS — M25669 Stiffness of unspecified knee, not elsewhere classified: Secondary | ICD-10-CM | POA: Diagnosis not present

## 2012-10-25 DIAGNOSIS — Z96659 Presence of unspecified artificial knee joint: Secondary | ICD-10-CM | POA: Diagnosis not present

## 2012-10-28 ENCOUNTER — Other Ambulatory Visit: Payer: Medicare Other

## 2012-10-31 ENCOUNTER — Other Ambulatory Visit: Payer: Self-pay | Admitting: Internal Medicine

## 2012-11-01 NOTE — Telephone Encounter (Signed)
Rx request to pharmacy/SLS  

## 2012-11-04 ENCOUNTER — Ambulatory Visit (INDEPENDENT_AMBULATORY_CARE_PROVIDER_SITE_OTHER): Payer: Medicare Other | Admitting: Family Medicine

## 2012-11-04 ENCOUNTER — Encounter: Payer: Self-pay | Admitting: Family Medicine

## 2012-11-04 ENCOUNTER — Ambulatory Visit: Payer: Self-pay | Admitting: Family Medicine

## 2012-11-04 VITALS — BP 147/91 | HR 63 | Temp 98.6°F | Resp 16 | Wt 149.5 lb

## 2012-11-04 DIAGNOSIS — F418 Other specified anxiety disorders: Secondary | ICD-10-CM

## 2012-11-04 DIAGNOSIS — F341 Dysthymic disorder: Secondary | ICD-10-CM | POA: Diagnosis not present

## 2012-11-04 DIAGNOSIS — F5105 Insomnia due to other mental disorder: Secondary | ICD-10-CM

## 2012-11-04 DIAGNOSIS — F489 Nonpsychotic mental disorder, unspecified: Secondary | ICD-10-CM

## 2012-11-04 DIAGNOSIS — F411 Generalized anxiety disorder: Secondary | ICD-10-CM

## 2012-11-04 DIAGNOSIS — I10 Essential (primary) hypertension: Secondary | ICD-10-CM

## 2012-11-04 NOTE — Assessment & Plan Note (Signed)
Stable. Continue 40mg  paxil qd. Continue clonazepam bid prn--she essentially takes this qhs only but has it available for daytime use should the need arise. She is planning on making initial eval appt with [facility] in April or May this year. Next f/u 50mo.

## 2012-11-04 NOTE — Progress Notes (Signed)
OFFICE NOTE  11/04/2012  CC:  Chief Complaint  Patient presents with  . Follow-up    4 weeks     HPI: Patient is a 72 y.o. Caucasian female who is here for 4 wk f/u for anxiety. Feeling much better still, sleep is much improved on clonaz hs dose.  Doesn't use clonaz in daytime. No panic.  She has some intermittent depressed mood still but these continue to get better. She has decided to postpone getting established with a counselor for a couple of more months.  No new issues.  Compliant with all other meds. Plavix has helped to extinguish the TIAs she had been having and her bp has been controlled well on cozaar.  Pertinent PMH:  Past Medical History  Diagnosis Date  . Vertigo   . Anxiety and depression   . Hyperlipemia   . TIA (transient ischemic attack)     patient on plavix,  saw Dr. Quentin Mulling at [facility].hospital in [place]  . Arthritis   . PFO (patent foramen ovale) echo 03/21/05    "hole in heart" saw Dr. Melina Modena [phone] [facility]. hospital  . 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 Urinary tract infection     hx of  . GERD (gastroesophageal reflux disease)   . Bilateral carpal tunnel syndrome   . Colitis     hx of  . Cataracts, bilateral   . Hypertension     MEDS:  Outpatient Prescriptions Prior to Visit  Medication Sig Dispense Refill  . amLODipine (NORVASC) 2.5 MG tablet TAKE 1 TABLET BY MOUTH EVERY DAY  90 tablet  2  . atorvastatin (LIPITOR) 20 MG tablet TAKE 1 TABLET BY MOUTH EVERY DAY  90 tablet  2  . clonazePAM (KLONOPIN) 1 MG tablet 1/2-1 tab po q12h prn anxiety and for sleep hs  180 tablet  0  . clopidogrel (PLAVIX) 75 MG tablet TAKE 1 TABLET BY MOUTH EVERY DAY  90 tablet  2  . losartan (COZAAR) 100 MG tablet TAKE 1 TABLET BY MOUTH EVERY DAY  90 tablet  1  . Multiple Vitamins-Minerals (WOMENS MULTIVITAMIN PLUS) TABS Take by mouth. With Calcium, Zinc & Iron.      .  Omega-3 Fatty Acids (FISH OIL) 1200 MG CAPS Take 1,200 mg by mouth daily.        Marland Kitchen PARoxetine (PAXIL) 40 MG tablet Take 1 tablet (40 mg total) by mouth every morning.  90 tablet  1   No facility-administered medications prior to visit.    PE: Blood pressure 147/91, pulse 63, temperature 98.6 F (37 C), temperature source Temporal, resp. rate 16, weight 149 lb 8 oz (67.813 kg), SpO2 99.00%. Gen: Alert, well appearing.  Patient is oriented to person, place, time, and situation. Affect: pleasant, slightly anxious.  Lucid thought and speech. CV: Reg, occ brief pause, with subsequent resumption of regular rhythm, brady to high 50s. LUNGS: CTA bilat, nonlabored  IMPRESSION AND PLAN:  GAD (generalized anxiety disorder) Stable. Continue 40mg  paxil qd. Continue clonazepam bid prn--she essentially takes this qhs only but has it available for daytime use should the need arise. She is planning on making initial eval appt with [facility] in April or May this year. Next f/u 88mo.  Hypertension Stable on cozaar 100 mg qd.  Insomnia secondary to depression with anxiety Improved significantly. Continue current meds.   An After Visit Summary was printed and given to  the patient.  FOLLOW UP: 93mo

## 2012-11-04 NOTE — Assessment & Plan Note (Signed)
Improved significantly. Continue current meds.

## 2012-11-04 NOTE — Assessment & Plan Note (Signed)
Stable on cozaar 100 mg qd.

## 2012-11-21 DIAGNOSIS — L819 Disorder of pigmentation, unspecified: Secondary | ICD-10-CM | POA: Diagnosis not present

## 2012-11-21 DIAGNOSIS — L909 Atrophic disorder of skin, unspecified: Secondary | ICD-10-CM | POA: Diagnosis not present

## 2012-11-21 DIAGNOSIS — D1801 Hemangioma of skin and subcutaneous tissue: Secondary | ICD-10-CM | POA: Diagnosis not present

## 2012-11-21 DIAGNOSIS — L570 Actinic keratosis: Secondary | ICD-10-CM | POA: Diagnosis not present

## 2012-11-21 DIAGNOSIS — L821 Other seborrheic keratosis: Secondary | ICD-10-CM | POA: Diagnosis not present

## 2012-11-21 DIAGNOSIS — L738 Other specified follicular disorders: Secondary | ICD-10-CM | POA: Diagnosis not present

## 2012-11-21 DIAGNOSIS — Q828 Other specified congenital malformations of skin: Secondary | ICD-10-CM | POA: Diagnosis not present

## 2012-12-23 ENCOUNTER — Telehealth: Payer: Self-pay | Admitting: *Deleted

## 2012-12-24 ENCOUNTER — Telehealth: Payer: Self-pay | Admitting: *Deleted

## 2012-12-24 MED ORDER — ATORVASTATIN CALCIUM 20 MG PO TABS: ORAL_TABLET | ORAL | Status: DC

## 2012-12-24 NOTE — Telephone Encounter (Signed)
Rx requested to new pharmacy; pt informed/SLS

## 2012-12-25 NOTE — Telephone Encounter (Signed)
Pt called wrong office.

## 2013-01-10 ENCOUNTER — Ambulatory Visit
Admission: RE | Admit: 2013-01-10 | Discharge: 2013-01-10 | Disposition: A | Discharge status: Home / Self Care | Payer: Medicare Other | Source: Ambulatory Visit | Attending: Endocrinology | Admitting: Endocrinology

## 2013-01-10 DIAGNOSIS — E049 Nontoxic goiter, unspecified: Secondary | ICD-10-CM

## 2013-01-10 DIAGNOSIS — E042 Nontoxic multinodular goiter: Secondary | ICD-10-CM | POA: Diagnosis not present

## 2013-01-13 DIAGNOSIS — E039 Hypothyroidism, unspecified: Secondary | ICD-10-CM | POA: Diagnosis not present

## 2013-01-21 DIAGNOSIS — E049 Nontoxic goiter, unspecified: Secondary | ICD-10-CM | POA: Diagnosis not present

## 2013-01-28 ENCOUNTER — Ambulatory Visit (INDEPENDENT_AMBULATORY_CARE_PROVIDER_SITE_OTHER): Payer: Medicare Other | Admitting: Family Medicine

## 2013-01-28 ENCOUNTER — Encounter: Payer: Self-pay | Admitting: Family Medicine

## 2013-01-28 VITALS — BP 127/75 | HR 87 | Temp 98.0°F | Resp 18 | Ht 61.5 in | Wt 148.0 lb

## 2013-01-28 DIAGNOSIS — F411 Generalized anxiety disorder: Secondary | ICD-10-CM

## 2013-01-28 DIAGNOSIS — F3289 Other specified depressive episodes: Secondary | ICD-10-CM

## 2013-01-28 DIAGNOSIS — E079 Disorder of thyroid, unspecified: Secondary | ICD-10-CM

## 2013-01-28 DIAGNOSIS — F329 Major depressive disorder, single episode, unspecified: Secondary | ICD-10-CM | POA: Diagnosis not present

## 2013-01-28 DIAGNOSIS — I10 Essential (primary) hypertension: Secondary | ICD-10-CM | POA: Diagnosis not present

## 2013-01-28 DIAGNOSIS — L989 Disorder of the skin and subcutaneous tissue, unspecified: Secondary | ICD-10-CM

## 2013-01-28 NOTE — Progress Notes (Addendum)
OFFICE NOTE  01/28/2013  CC:  Chief Complaint  Patient presents with  . Thyroid Problem  . Mass    mole like growth on R shoulder / neck pts concerned about     HPI: Patient is a 72 y.o. Caucasian female who is here for 3 mo f/u GAD, depression, and HTN. She reports feeling pretty stable regarding her anxiety and mood.  Still has some bad days but even her bad days are not nearly as bad/intense as they had been prior to getting on current meds. Due to some insurance issues/switch lately she has still chosen not to pursue counseling at this time.  She reiterates the fact that in the last months-year she has had many big life stressors, mainly some deaths in family/friends and some marital problems of her children, etc.  She is still processing a lot of this.  Denies any recent prolonged sadness, crying spells, excessive irritability, or anhedonia.  She has always had inadequate sleep and energy levels, nothing changed lately. Of note, a recent f/u with her endocrinologist, Dr. Talmage Nap, is described by pt today--no records available at this time.  She reports that her right sided thyroid nodules were stable, but when asked about typical hypothyroidism sx's the patient responded positively to most of them.  It is unclear whether she had abnl thyroid levels on blood testing, but ultimately what occurred is that she was started on levothyroxine 25 mcg qd about a week ago.  Has plans for f/u with endo in about 55mo.  She asks whether or not thyroid hormone abnormality could be contributing to her psychological problems over the last year or so.  Reports compliance with bp meds, says home bp checks have been normal--monitors infrequently.  She asks me to look at a skin lesion on right shoulder region, says it suddenly came up about 1 wk ago, seemed to start as a little white, flaky, "stuck on" spot that then grew to it's current appearance over the last week approximately.  Pertinent PMH:  Past Medical  History  Diagnosis Date  . Vertigo   . Anxiety and depression   . Hyperlipemia   . TIA (transient ischemic attack)     patient on plavix,  saw Dr. Quentin Mulling at [facility].hospital in [place]  . Arthritis   . PFO (patent foramen ovale) echo 03/21/05    "hole in heart" saw Dr. Melina Modena [phone] [facility]. hospital.  Plavix med mgmt.  . 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 Urinary tract infection     hx of  . GERD (gastroesophageal reflux disease)   . Bilateral carpal tunnel syndrome   . Colitis     hx of  . Cataracts, bilateral   . Hypertension    Past Surgical History  Procedure Laterality Date  . Abdominal hysterectomy    . Bladder suspension    . Purse string      Rectum  . Thyroidectomy      Left  . Rectocele repair    . Colonoscopy    . Breast surgery      "breast lumps removed"  . Dilation and curettage of uterus    . 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 amLODipine (NORVASC) 2.5 MG tablet TAKE 1 TABLET BY MOUTH EVERY DAY  90 tablet  2  . atorvastatin (LIPITOR) 20 MG tablet TAKE 1 TABLET BY MOUTH EVERY DAY  90 tablet  1  . clonazePAM (KLONOPIN) 1 MG tablet 1/2-1 tab po q12h prn anxiety and for sleep hs  180 tablet  0  . clopidogrel (PLAVIX) 75 MG tablet TAKE 1 TABLET BY MOUTH EVERY DAY  90 tablet  2  . losartan (COZAAR) 100 MG tablet TAKE 1 TABLET BY MOUTH EVERY DAY  90 tablet  1  . methocarbamol (ROBAXIN) 500 MG tablet       . Multiple Vitamins-Minerals (WOMENS MULTIVITAMIN PLUS) TABS Take by mouth. With Calcium, Zinc & Iron.      . Omega-3 Fatty Acids (FISH OIL) 1200 MG CAPS Take 1,200 mg by mouth daily.        Marland Kitchen PARoxetine (PAXIL) 40 MG tablet Take 1 tablet (40 mg  total) by mouth every morning.  90 tablet  1  . amoxicillin (AMOXIL) 500 MG capsule        No facility-administered medications prior to visit.  **Pt not currently taking amoxil as listed above.  PE: Blood pressure 127/75, pulse 87, temperature 98 F (36.7 C), temperature source Oral, resp. rate 18, height 5' 1.5" (1.562 m), weight 148 lb (67.132 kg), SpO2 94.00%. Gen: Alert, well appearing.  Patient is oriented to person, place, time, and situation. CV: RRR, no m/r/g.   LUNGS: CTA bilat, nonlabored resps, good aeration in all lung fields. EXT: no clubbing, cyanosis, or edema.  SKIN: superior most region of the right trapezius has a 2 mm round nevus-shaped lesion with slightly gritty/greasy surface texture, color is flesh-colored to light brown.  No color irregularity, no border irregularity. Several more classic appearing seb keratoses lesions are noted on shoulders.  IMPRESSION AND PLAN:  1) GAD with depression; stable.  Continue current med regimen.  Encouraged pt to pursue counseling when she is ready.  2) HTN: stable.  Chemistry panel December 2013 was normal.  Will continue current meds/dosing and repeat BMET at next f/u in 55mo.  3) Thyroid nodules--stable; question of hypothyroidism.  Will get endo records to further clarify.  4) Skin lesion most c/w seborrheic keratosis.  Watchful waiting approach.  She'll let me or her dermatologist know if it begins to change or she becomes worried about it for any reason.  FOLLOW UP: 55mo

## 2013-02-17 DIAGNOSIS — M25669 Stiffness of unspecified knee, not elsewhere classified: Secondary | ICD-10-CM | POA: Diagnosis not present

## 2013-03-18 ENCOUNTER — Telehealth: Payer: Self-pay | Admitting: *Deleted

## 2013-03-18 MED ORDER — CLOPIDOGREL BISULFATE 75 MG PO TABS: ORAL_TABLET | ORAL | Status: DC

## 2013-03-18 NOTE — Telephone Encounter (Signed)
Per pt, Insurance change caused change in pharmacy, now must use [facility], Rx to pharmacy/SLS

## 2013-03-18 NOTE — Telephone Encounter (Signed)
LMOM with contact name and number for return call RE: clarification on pharmacy for requested refill on Plavix; last Rx to [facility] 03.20.14, #[id number], current request from [address]/SLS

## 2013-03-25 DIAGNOSIS — E049 Nontoxic goiter, unspecified: Secondary | ICD-10-CM | POA: Diagnosis not present

## 2013-06-02 ENCOUNTER — Telehealth: Payer: Self-pay | Admitting: Family Medicine

## 2013-06-02 MED ORDER — LOSARTAN POTASSIUM 100 MG PO TABS: ORAL_TABLET | ORAL | Status: DC

## 2013-06-02 NOTE — Telephone Encounter (Signed)
Patient aware, she made OV for Friday to discuss changing medications.

## 2013-06-02 NOTE — Telephone Encounter (Signed)
Needs ov to discuss

## 2013-06-02 NOTE — Telephone Encounter (Signed)
Patient wants to see if you will change the paxil Rx to something different.  Please advise.

## 2013-06-06 ENCOUNTER — Ambulatory Visit (INDEPENDENT_AMBULATORY_CARE_PROVIDER_SITE_OTHER): Payer: Medicare Other | Admitting: Family Medicine

## 2013-06-06 ENCOUNTER — Encounter: Payer: Self-pay | Admitting: Family Medicine

## 2013-06-06 VITALS — BP 172/99 | HR 59 | Temp 98.8°F | Resp 16 | Ht 61.5 in | Wt 151.0 lb

## 2013-06-06 DIAGNOSIS — I10 Essential (primary) hypertension: Secondary | ICD-10-CM | POA: Diagnosis not present

## 2013-06-06 DIAGNOSIS — F418 Other specified anxiety disorders: Secondary | ICD-10-CM

## 2013-06-06 DIAGNOSIS — F341 Dysthymic disorder: Secondary | ICD-10-CM

## 2013-06-06 DIAGNOSIS — Z23 Encounter for immunization: Secondary | ICD-10-CM

## 2013-06-06 DIAGNOSIS — F331 Major depressive disorder, recurrent, moderate: Secondary | ICD-10-CM

## 2013-06-06 DIAGNOSIS — F489 Nonpsychotic mental disorder, unspecified: Secondary | ICD-10-CM

## 2013-06-06 DIAGNOSIS — E042 Nontoxic multinodular goiter: Secondary | ICD-10-CM

## 2013-06-06 MED ORDER — CITALOPRAM HYDROBROMIDE 20 MG PO TABS: 20.0000 mg | ORAL_TABLET | Freq: Every day | ORAL | Status: DC

## 2013-06-06 MED ORDER — AMLODIPINE BESYLATE 2.5 MG PO TABS: ORAL_TABLET | ORAL | Status: DC

## 2013-06-06 MED ORDER — LEVOTHYROXINE SODIUM 25 MCG PO TABS: 25.0000 ug | ORAL_TABLET | Freq: Every day | ORAL | Status: DC

## 2013-06-06 NOTE — Assessment & Plan Note (Addendum)
With components of PTSD and GAD. She really needs regularly scheduled counseling but financial probs preclude this option currently and in the foreseeable future. In the meantime, will start her on a different SSRI: start citalopram 20mg  qd today.  Therapeutic expectations and side effect profile of medication discussed today.  Patient's questions answered.

## 2013-06-06 NOTE — Assessment & Plan Note (Signed)
She has somehow run out/not been taking amlodipine. Will restart this and continue 100mg  losartan qd. Home bp monitoring historically has been normal.

## 2013-06-06 NOTE — Assessment & Plan Note (Signed)
May take clonazepam qhs prn as she has in the past for this.
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
## 2013-06-06 NOTE — Assessment & Plan Note (Signed)
Pt reports recent f/u in office with Dr. Talmage Nap and recheck of TSH normal, and she was continued on 25 mcg levothyroxine. I did 90 d rf of this med today.

## 2013-07-07 ENCOUNTER — Ambulatory Visit (INDEPENDENT_AMBULATORY_CARE_PROVIDER_SITE_OTHER): Payer: Medicare Other | Admitting: Family Medicine

## 2013-07-07 ENCOUNTER — Encounter: Payer: Self-pay | Admitting: Family Medicine

## 2013-07-07 VITALS — BP 140/86 | HR 64 | Temp 99.4°F | Resp 18 | Ht 61.5 in | Wt 158.0 lb

## 2013-07-07 DIAGNOSIS — F329 Major depressive disorder, single episode, unspecified: Secondary | ICD-10-CM

## 2013-07-07 DIAGNOSIS — Q211 Atrial septal defect: Secondary | ICD-10-CM | POA: Insufficient documentation

## 2013-07-07 DIAGNOSIS — F331 Major depressive disorder, recurrent, moderate: Secondary | ICD-10-CM

## 2013-07-07 DIAGNOSIS — I10 Essential (primary) hypertension: Secondary | ICD-10-CM

## 2013-07-07 DIAGNOSIS — F341 Dysthymic disorder: Secondary | ICD-10-CM

## 2013-07-07 DIAGNOSIS — N644 Mastodynia: Secondary | ICD-10-CM

## 2013-07-07 MED ORDER — CITALOPRAM HYDROBROMIDE 20 MG PO TABS: 20.0000 mg | ORAL_TABLET | Freq: Every day | ORAL | Status: DC

## 2013-07-07 MED ORDER — CLOPIDOGREL BISULFATE 75 MG PO TABS: ORAL_TABLET | ORAL | Status: DC

## 2013-07-07 NOTE — Assessment & Plan Note (Signed)
No significant improvement on 20mg  citalopram x 57mo. She feels a bit irritable but I feel like this is her disease process and not a side effect of the med. Continue at current dose for 1 more month.  Encouraged her to take 1/2 of her clonaz 1mg  tab q12h to help with irritability until SSRI "kicks in". Also, she is open to counseling now so I gave her some contact info for a few counseling options today.

## 2013-07-07 NOTE — Assessment & Plan Note (Signed)
Continue with plavix med mgmt---RFd this med today.

## 2013-07-07 NOTE — Progress Notes (Signed)
Pre visit review using our clinic review tool, if applicable. No additional management support is needed unless otherwise documented below in the visit note.  OFFICE NOTE  07/07/2013  CC:  Chief Complaint  Patient presents with  . Follow-up     HPI: Patient is a 72 y.o. Caucasian female who is here for 1 mo f/u MDD/PTSD/GAD. Switched from paxil to citalopram 1 mo ago.  Counseling: no, due to financial constraints.  Now pt is open to pursuing counseling.  Feels no better on citalopram, notes some irritability. No SI or HI.  Compliant with meds.  Denies HAs, CP, SOB, or dizziness.  She does have some upset stomach/bloating frequently.  Asks for RF on her plavix, which she takes for med mgmt of her PFO.  Says she got a letter that it is time for her mammogram in December this year.  Ever since getting the letter she has been doing breast self exam more, has noted some pain/tenderness and ? Lumpiness in the area where she had a breast biopsy done in the remote past.  No nipple d/c.  No undesired wt loss, no loss of appetite, no night sweats.  Hurts worse when reaching right arm up over shoulder level.  Pertinent PMH:  Past Medical History  Diagnosis Date  . Vertigo   . Anxiety and depression   . Hyperlipemia   . TIA (transient ischemic attack)     patient on plavix,  saw Dr. Quentin Mulling at [facility].hospital in [place]  . Arthritis   . PFO (patent foramen ovale) echo 03/21/05    "hole in heart" saw Dr. Melina Modena [phone] [facility]. hospital.  Plavix med mgmt.  . 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 Urinary tract infection     hx of  . GERD (gastroesophageal reflux disease)   . Bilateral carpal tunnel syndrome   . Colitis     hx of  . Cataracts, bilateral   . Hypertension     MEDS:  Outpatient Prescriptions Prior to Visit  Medication Sig Dispense Refill  . amLODipine (NORVASC) 2.5 MG  tablet TAKE 1 TABLET BY MOUTH EVERY DAY  90 tablet  2  . atorvastatin (LIPITOR) 20 MG tablet TAKE 1 TABLET BY MOUTH EVERY DAY  90 tablet  1  . levothyroxine (SYNTHROID, LEVOTHROID) 25 MCG tablet Take 1 tablet (25 mcg total) by mouth daily before breakfast.  90 tablet  1  . losartan (COZAAR) 100 MG tablet TAKE 1 TABLET BY MOUTH EVERY DAY  90 tablet  1  . Multiple Vitamins-Minerals (WOMENS MULTIVITAMIN PLUS) TABS Take by mouth. With Calcium, Zinc & Iron.      . Omega-3 Fatty Acids (FISH OIL) 1200 MG CAPS Take 1,200 mg by mouth daily.        . citalopram (CELEXA) 20 MG tablet Take 1 tablet (20 mg total) by mouth daily.  30 tablet  1  . clopidogrel (PLAVIX) 75 MG tablet TAKE 1 TABLET BY MOUTH EVERY DAY  90 tablet  1  . clonazePAM (KLONOPIN) 1 MG tablet 1/2-1 tab po q12h prn anxiety and for sleep hs  180 tablet  0  . methocarbamol (ROBAXIN) 500 MG tablet        No facility-administered medications prior to visit.    PE: Blood pressure 140/86, pulse 64, temperature 99.4 F (37.4 C), temperature source Temporal, resp. rate 18, height 5' 1.5" (1.562 m), weight 158  lb (71.668 kg), SpO2 98.00%. Pt examined with April Miller, CMA as chaperone. Gen: Alert, well appearing.  Patient is oriented to person, place, time, and situation. AFFECT: pleasant, nervous.  Lucid thought and speech. CV: RRR, no m/r/g.   LUNGS: CTA bilat, nonlabored resps, good aeration in all lung fields. Right breast: No dominant, discrete, fixed  or suspicious masses are noted.  No skin or nipple changes or axillary nodes.  Mild focal tenderness in upper/outer quadrant but no corresponding tissue abnormality is palpable.   IMPRESSION AND PLAN:  Major depressive disorder, recurrent episode, moderate No significant improvement on 20mg  citalopram x 58mo. She feels a bit irritable but I feel like this is her disease process and not a side effect of the med. Continue at current dose for 1 more month.  Encouraged her to take 1/2 of her  clonaz 1mg  tab q12h to help with irritability until SSRI "kicks in". Also, she is open to counseling now so I gave her some contact info for a few counseling options today.  PFO (patent foramen ovale) Continue with plavix med mgmt---RFd this med today.  Breast pain, right NOrmal exam today. Reassured pt. Encouraged pt to get her next screening mammogram as scheduled in the early part of next month.  Hypertension The current medical regimen is effective;  continue present plan and medications. We'll do BMET at next f/u.   An After Visit Summary was printed and given to the patient.   FOLLOW UP: 1 mo

## 2013-07-07 NOTE — Assessment & Plan Note (Signed)
NOrmal exam today. Reassured pt. Encouraged pt to get her next screening mammogram as scheduled in the early part of next month.

## 2013-07-07 NOTE — Assessment & Plan Note (Signed)
The current medical regimen is effective;  continue present plan and medications. We'll do BMET at next f/u.

## 2013-07-08 ENCOUNTER — Other Ambulatory Visit: Payer: Self-pay | Admitting: Family Medicine

## 2013-07-08 DIAGNOSIS — Z1231 Encounter for screening mammogram for malignant neoplasm of breast: Secondary | ICD-10-CM

## 2013-07-30 ENCOUNTER — Other Ambulatory Visit: Payer: Self-pay | Admitting: Family Medicine

## 2013-07-30 ENCOUNTER — Ambulatory Visit (HOSPITAL_BASED_OUTPATIENT_CLINIC_OR_DEPARTMENT_OTHER): Payer: Medicare Other

## 2013-07-30 ENCOUNTER — Ambulatory Visit
Admission: RE | Admit: 2013-07-30 | Discharge: 2013-07-30 | Disposition: A | Discharge status: Home / Self Care | Payer: Medicare Other | Source: Ambulatory Visit | Attending: Family Medicine | Admitting: Family Medicine

## 2013-07-30 ENCOUNTER — Ambulatory Visit: Payer: Medicare Other | Admitting: Family Medicine

## 2013-07-30 DIAGNOSIS — Z1231 Encounter for screening mammogram for malignant neoplasm of breast: Secondary | ICD-10-CM | POA: Diagnosis not present

## 2013-08-01 DIAGNOSIS — F4323 Adjustment disorder with mixed anxiety and depressed mood: Secondary | ICD-10-CM | POA: Diagnosis not present

## 2013-08-04 ENCOUNTER — Encounter: Payer: Self-pay | Admitting: Family Medicine

## 2013-08-04 ENCOUNTER — Ambulatory Visit (INDEPENDENT_AMBULATORY_CARE_PROVIDER_SITE_OTHER): Payer: Medicare Other | Admitting: Family Medicine

## 2013-08-04 VITALS — BP 148/87 | HR 65 | Temp 98.0°F | Ht 61.5 in | Wt 157.0 lb

## 2013-08-04 DIAGNOSIS — I10 Essential (primary) hypertension: Secondary | ICD-10-CM | POA: Diagnosis not present

## 2013-08-04 DIAGNOSIS — F329 Major depressive disorder, single episode, unspecified: Secondary | ICD-10-CM

## 2013-08-04 DIAGNOSIS — F341 Dysthymic disorder: Secondary | ICD-10-CM

## 2013-08-04 DIAGNOSIS — R928 Other abnormal and inconclusive findings on diagnostic imaging of breast: Secondary | ICD-10-CM

## 2013-08-04 MED ORDER — CITALOPRAM HYDROBROMIDE 40 MG PO TABS: 40.0000 mg | ORAL_TABLET | Freq: Every day | ORAL | Status: DC

## 2013-08-04 MED ORDER — ATORVASTATIN CALCIUM 20 MG PO TABS: ORAL_TABLET | ORAL | Status: DC

## 2013-08-04 NOTE — Progress Notes (Addendum)
OFFICE NOTE  08/04/2013  CC:  Chief Complaint  Patient presents with  . Follow-up     HPI: Patient is a 72 y.o. Caucasian female who is here for 1 mo f/u anxiety/depression. Still having some crying spells, frustration, highly anxious as well.   She is sleeping better. Has been seeing counselor, Hurley Cisco, says this is a very good thing. Feels no side effects from citalopram.  No suicidal ideation.  Compliant with all chronic meds.  Occ home bp check normal.  We reviewed her recent abnormal screening mammogram, which the patient had apparently not been contacted about yet.   Pertinent PMH:  Past Medical History  Diagnosis Date  . Vertigo   . Anxiety and depression   . Hyperlipemia   . TIA (transient ischemic attack)     patient on plavix,  saw Dr. Quentin Mulling at [facility].hospital in [place]  . Arthritis   . PFO (patent foramen ovale) echo 03/21/05    "hole in heart" saw Dr. Melina Modena [phone] [facility]. hospital.  Plavix med mgmt.  . 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 Urinary tract infection     hx of  . GERD (gastroesophageal reflux disease)   . Bilateral carpal tunnel syndrome   . Colitis     hx of  . Cataracts, bilateral   . Hypertension     MEDS:  Outpatient Prescriptions Prior to Visit  Medication Sig Dispense Refill  . amLODipine (NORVASC) 2.5 MG tablet TAKE 1 TABLET BY MOUTH EVERY DAY  90 tablet  2  . atorvastatin (LIPITOR) 20 MG tablet TAKE 1 TABLET BY MOUTH EVERY DAY  90 tablet  1  . calcium carbonate (OS-CAL - DOSED IN MG OF ELEMENTAL CALCIUM) 1250 MG tablet Take 1 tablet by mouth.      . citalopram (CELEXA) 20 MG tablet Take 1 tablet (20 mg total) by mouth daily.  30 tablet  5  . clonazePAM (KLONOPIN) 1 MG tablet 1/2-1 tab po q12h prn anxiety and for sleep hs  180 tablet  0  . clopidogrel (PLAVIX) 75 MG tablet TAKE 1 TABLET BY MOUTH EVERY DAY  90 tablet  4  .  levothyroxine (SYNTHROID, LEVOTHROID) 25 MCG tablet Take 1 tablet (25 mcg total) by mouth daily before breakfast.  90 tablet  1  . losartan (COZAAR) 100 MG tablet TAKE 1 TABLET BY MOUTH EVERY DAY  90 tablet  1  . methocarbamol (ROBAXIN) 500 MG tablet       . Multiple Vitamins-Minerals (WOMENS MULTIVITAMIN PLUS) TABS Take by mouth. With Calcium, Zinc & Iron.      . Omega-3 Fatty Acids (FISH OIL) 1200 MG CAPS Take 1,200 mg by mouth daily.         No facility-administered medications prior to visit.    PE: Blood pressure 148/87, pulse 65, temperature 98 F (36.7 C), temperature source Oral, height 5' 1.5" (1.562 m), weight 157 lb (71.215 kg), SpO2 96.00%. Gen: Alert, well appearing.  Patient is oriented to person, place, time, and situation. AFFECT: pleasant, lucid thought and speech. No further exam today.  IMPRESSION AND PLAN:  1) Anxiety and depression/PTSD--mild improvement on citalopram.  Increase to 40mg  qd dosing over the next couple of weeks.  2) HTN; stable.  The current medical regimen is effective;  continue present plan and medications. BMET at next f/u visit.  3) Recent abnormal mammogram: communicated results to pt  today.  Called radiology and verified with them that she was on a list to be called and have f/u testing arranged.   FOLLOW UP:  6 wks f/u dep, anx, htn

## 2013-08-04 NOTE — Progress Notes (Signed)
Pre-visit discussion using our clinic review tool. No additional management support is needed unless otherwise documented below in the visit note.  

## 2013-08-08 ENCOUNTER — Other Ambulatory Visit: Payer: Self-pay | Admitting: Family Medicine

## 2013-08-08 DIAGNOSIS — R928 Other abnormal and inconclusive findings on diagnostic imaging of breast: Secondary | ICD-10-CM

## 2013-08-14 DIAGNOSIS — N182 Chronic kidney disease, stage 2 (mild): Secondary | ICD-10-CM

## 2013-08-14 HISTORY — DX: Chronic kidney disease, stage 2 (mild): N18.2

## 2013-08-20 ENCOUNTER — Ambulatory Visit
Admission: RE | Admit: 2013-08-20 | Discharge: 2013-08-20 | Disposition: A | Discharge status: Home / Self Care | Payer: Medicare Other | Source: Ambulatory Visit | Attending: Family Medicine | Admitting: Family Medicine

## 2013-08-20 DIAGNOSIS — R928 Other abnormal and inconclusive findings on diagnostic imaging of breast: Secondary | ICD-10-CM

## 2013-08-22 DIAGNOSIS — F4323 Adjustment disorder with mixed anxiety and depressed mood: Secondary | ICD-10-CM | POA: Diagnosis not present

## 2013-09-12 ENCOUNTER — Encounter: Payer: Self-pay | Admitting: Family Medicine

## 2013-09-12 DIAGNOSIS — F4323 Adjustment disorder with mixed anxiety and depressed mood: Secondary | ICD-10-CM | POA: Diagnosis not present

## 2013-09-16 ENCOUNTER — Encounter: Payer: Self-pay | Admitting: Family Medicine

## 2013-09-16 ENCOUNTER — Ambulatory Visit (INDEPENDENT_AMBULATORY_CARE_PROVIDER_SITE_OTHER): Payer: Medicare Other | Admitting: Family Medicine

## 2013-09-16 VITALS — BP 120/81 | HR 68 | Temp 99.8°F | Resp 18 | Ht 61.5 in | Wt 157.0 lb

## 2013-09-16 DIAGNOSIS — E049 Nontoxic goiter, unspecified: Secondary | ICD-10-CM | POA: Diagnosis not present

## 2013-09-16 DIAGNOSIS — F341 Dysthymic disorder: Secondary | ICD-10-CM | POA: Diagnosis not present

## 2013-09-16 DIAGNOSIS — I10 Essential (primary) hypertension: Secondary | ICD-10-CM

## 2013-09-16 DIAGNOSIS — F419 Anxiety disorder, unspecified: Secondary | ICD-10-CM

## 2013-09-16 DIAGNOSIS — F329 Major depressive disorder, single episode, unspecified: Secondary | ICD-10-CM

## 2013-09-16 LAB — BASIC METABOLIC PANEL
BUN: 15 mg/dL (ref 6–23)
CO2: 29 mEq/L (ref 19–32)
Calcium: 10.6 mg/dL — ABNORMAL HIGH (ref 8.4–10.5)
Chloride: 103 mEq/L (ref 96–112)
Creatinine, Ser: 1 mg/dL (ref 0.4–1.2)
GFR: 57.57 mL/min — AB (ref >60.00)
Glucose, Bld: 77 mg/dL (ref 70–99)
Potassium: 4.8 mEq/L (ref 3.5–5.1)
Sodium: 139 mEq/L (ref 135–145)

## 2013-09-16 MED ORDER — CITALOPRAM HYDROBROMIDE 40 MG PO TABS: ORAL_TABLET | ORAL | Status: DC

## 2013-09-16 MED ORDER — DESVENLAFAXINE SUCCINATE ER 50 MG PO TB24: 50.0000 mg | ORAL_TABLET | Freq: Every day | ORAL | Status: DC

## 2013-09-16 NOTE — Assessment & Plan Note (Addendum)
Not ideally controlled. Plan is go continue counseling with Nunzio Cobbs. Instructions: Take 1/2 of citalopram 40mg  tab once daily for 7d, then d/c this med and start the samples of Pristiq I have given you: one 50mg  pill per day. Continue this dose until I see you again in 1 mo. Call or return for problems. Continue clonazepam as she is currently dosing it.

## 2013-09-16 NOTE — Assessment & Plan Note (Signed)
Dr. Chalmers Cater following.   Continue levothyroxine 25 mcg qd as per Dr. Chalmers Cater.

## 2013-09-16 NOTE — Assessment & Plan Note (Signed)
The current medical regimen is effective;  continue present plan and medications. BMET today. 

## 2013-09-16 NOTE — Patient Instructions (Signed)
Take 1/2 of citalopram 40mg  tab once daily for 7d, then d/c this med and start the samples of Pristiq I have given you: one 50mg  pill per day.

## 2013-09-16 NOTE — Progress Notes (Signed)
OFFICE NOTE  09/16/2013  CC:  Chief Complaint  Patient presents with  . Follow-up  . Thyroid Problem     HPI: Patient is a 73 y.o. Caucasian female who is here for 6 wk f/u dep, anx, HTN. Last visit we made a plan to gradually titrate her citalopram to 40mg  qd.   No signif change in anxiety and depression but feels a bit more "buzzy" in the head as a side effect. Takes clonaz pretty infrequently, it sounds like, and it usually makes her sleep. Continues to see Nunzio Cobbs for counseling--going ok. Seeing Dr. Chalmers Cater for goiter and apparently a gradually falling thyroid level: has not been started on levothyroxine 25 mcg daily.  Past meds tried for psych: paxil x years, she also thinks maybe prozac has been tried.  ?maybe effexor-she is not sure.  Then most recently citalopram (plus clonaz).  Pertinent PMH:  Past Medical History  Diagnosis Date  . Vertigo   . Anxiety and depression   . Hyperlipidemia   . TIA (transient ischemic attack)     patient on plavix,  saw Dr. Isabell Jarvis at [facility].hospital in [place]  . Arthritis   . PFO (patent foramen ovale) echo 03/21/05    "hole in heart" saw Dr. Sharee Holster [phone] [facility]. hospital.  Plavix med mgmt.  . Goiter, euthyroid     Lobectomy (left) of thyroid for benign nodule.  Right lobe nodules followed by Dr. Chalmers Cater (FNA 2012 benign goiter) with ultrasounds and have been stable, most recently 01/10/13.  Marland Kitchen Urinary tract infection     hx of  . GERD (gastroesophageal reflux disease)   . Bilateral carpal tunnel syndrome   . Colitis     hx of  . Cataracts, bilateral   . Hypertension   . Rectocele   . H/O adenomatous polyp of colon 2012    5 yr recall    MEDS:  Outpatient Prescriptions Prior to Visit  Medication Sig Dispense Refill  . amLODipine (NORVASC) 2.5 MG tablet TAKE 1 TABLET BY MOUTH EVERY DAY  90 tablet  2  . atorvastatin (LIPITOR) 20 MG tablet TAKE 1 TABLET BY MOUTH EVERY DAY  90 tablet  1  . calcium carbonate  (OS-CAL - DOSED IN MG OF ELEMENTAL CALCIUM) 1250 MG tablet Take 1 tablet by mouth.      . clonazePAM (KLONOPIN) 1 MG tablet 1/2-1 tab po q12h prn anxiety and for sleep hs  180 tablet  0  . clopidogrel (PLAVIX) 75 MG tablet TAKE 1 TABLET BY MOUTH EVERY DAY  90 tablet  4  . levothyroxine (SYNTHROID, LEVOTHROID) 25 MCG tablet Take 1 tablet (25 mcg total) by mouth daily before breakfast.  90 tablet  1  . losartan (COZAAR) 100 MG tablet TAKE 1 TABLET BY MOUTH EVERY DAY  90 tablet  1  . Multiple Vitamins-Minerals (WOMENS MULTIVITAMIN PLUS) TABS Take by mouth. With Calcium, Zinc & Iron.      . Omega-3 Fatty Acids (FISH OIL) 1200 MG CAPS Take 1,200 mg by mouth daily.        . citalopram (CELEXA) 40 MG tablet Take 1 tablet (40 mg total) by mouth daily.  90 tablet  1  . methocarbamol (ROBAXIN) 500 MG tablet        No facility-administered medications prior to visit.    PE: Blood pressure 120/81, pulse 68, temperature 99.8 F (37.7 C), temperature source Temporal, resp. rate 18, height 5' 1.5" (1.562 m), weight 157 lb (71.215 kg), SpO2 98.00%.  Gen: Alert, well appearing.  Patient is oriented to person, place, time, and situation. Affect: pleasant but anxious.  No crying. CV: RRR, no m/r/g.   LUNGS: CTA bilat, nonlabored resps, good aeration in all lung fields.   IMPRESSION AND PLAN:  Anxiety and depression Not ideally controlled. Plan is go continue counseling with Nunzio Cobbs. Instructions: Take 1/2 of citalopram 40mg  tab once daily for 7d, then d/c this med and start the samples of Pristiq I have given you: one 50mg  pill per day. Continue this dose until I see you again in 1 mo. Call or return for problems. Continue clonazepam as she is currently dosing it.    Goiter Dr. Chalmers Cater following.   Continue levothyroxine 25 mcg qd as per Dr. Chalmers Cater.  Hypertension The current medical regimen is effective;  continue present plan and medications. BMET today.   An After Visit Summary was  printed and given to the patient.  FOLLOW UP: 1 mo

## 2013-09-17 ENCOUNTER — Telehealth: Payer: Self-pay | Admitting: Family Medicine

## 2013-09-17 NOTE — Telephone Encounter (Signed)
Relevant patient education assigned to patient using Emmi. ° °

## 2013-10-03 DIAGNOSIS — F4323 Adjustment disorder with mixed anxiety and depressed mood: Secondary | ICD-10-CM | POA: Diagnosis not present

## 2013-10-15 ENCOUNTER — Ambulatory Visit: Payer: Medicare Other | Admitting: Family Medicine

## 2013-10-27 ENCOUNTER — Ambulatory Visit (INDEPENDENT_AMBULATORY_CARE_PROVIDER_SITE_OTHER): Payer: Medicare Other | Admitting: Family Medicine

## 2013-10-27 ENCOUNTER — Encounter: Payer: Self-pay | Admitting: Family Medicine

## 2013-10-27 VITALS — BP 143/86 | HR 61 | Temp 99.2°F | Resp 18 | Ht 61.5 in | Wt 158.0 lb

## 2013-10-27 DIAGNOSIS — F341 Dysthymic disorder: Secondary | ICD-10-CM

## 2013-10-27 DIAGNOSIS — F419 Anxiety disorder, unspecified: Principal | ICD-10-CM

## 2013-10-27 DIAGNOSIS — F329 Major depressive disorder, single episode, unspecified: Secondary | ICD-10-CM

## 2013-10-27 MED ORDER — DESVENLAFAXINE SUCCINATE ER 50 MG PO TB24
50.0000 mg | ORAL_TABLET | Freq: Every day | ORAL | Status: DC
Start: 2013-10-27 — End: 2014-09-02

## 2013-10-27 NOTE — Progress Notes (Signed)
OFFICE NOTE  10/27/2013  CC:  Chief Complaint  Patient presents with  . Follow-up     HPI: Patient is a 73 y.o. white female who is here for 1 mo f/u anxiety/depression. No side effects from pristique.  However, she does feel the same level of anxiety and depressed mood. Continues to see her counselor.   Has not taken clonaz for about 2 mo. She has weened off the citalopram.  She recently helped her husband with taking care of lots of complex arrangements for an ailing family member while in [place].  She even drove in a strange city!  Pertinent PMH:  Past medical, surgical, social, and family history reviewed and no changes are noted since last office visit.  MEDS:  Outpatient Prescriptions Prior to Visit  Medication Sig Dispense Refill  . amLODipine (NORVASC) 2.5 MG tablet TAKE 1 TABLET BY MOUTH EVERY DAY  90 tablet  2  . atorvastatin (LIPITOR) 20 MG tablet TAKE 1 TABLET BY MOUTH EVERY DAY  90 tablet  1  . calcium carbonate (OS-CAL - DOSED IN MG OF ELEMENTAL CALCIUM) 1250 MG tablet Take 1 tablet by mouth.      . citalopram (CELEXA) 40 MG tablet weening off this med as of 09/16/13  90 tablet  1  . clonazePAM (KLONOPIN) 1 MG tablet 1/2-1 tab po q12h prn anxiety and for sleep hs  180 tablet  0  . clopidogrel (PLAVIX) 75 MG tablet TAKE 1 TABLET BY MOUTH EVERY DAY  90 tablet  4  . desvenlafaxine (PRISTIQ) 50 MG 24 hr tablet Take 1 tablet (50 mg total) by mouth daily.  35 tablet  0  . levothyroxine (SYNTHROID, LEVOTHROID) 25 MCG tablet Take 1 tablet (25 mcg total) by mouth daily before breakfast.  90 tablet  1  . losartan (COZAAR) 100 MG tablet TAKE 1 TABLET BY MOUTH EVERY DAY  90 tablet  1  . methocarbamol (ROBAXIN) 500 MG tablet       . Multiple Vitamins-Minerals (WOMENS MULTIVITAMIN PLUS) TABS Take by mouth. With Calcium, Zinc & Iron.      . Omega-3 Fatty Acids (FISH OIL) 1200 MG CAPS Take 1,200 mg by mouth daily.         No facility-administered medications prior to visit.     PE: Blood pressure 143/86, pulse 61, temperature 99.2 F (37.3 C), temperature source Temporal, resp. rate 18, height 5' 1.5" (1.562 m), weight 158 lb (71.668 kg), SpO2 99.00%. Wt Readings from Last 2 Encounters:  10/27/13 158 lb (71.668 kg)  09/16/13 157 lb (71.215 kg)    Gen: alert, oriented x 4, affect pleasant.  Lucid thinking and conversation noted. HEENT: PERRLA, EOMI.   Neck: no LAD, mass, or thyromegaly. CV: RRR, no m/r/g LUNGS: CTA bilat, nonlabored. NEURO: no tremor or tics noted on observation.  Coordination intact. CN 2-12 grossly intact bilaterally, strength 5/5 in all extremeties.  No ataxia.   IMPRESSION AND PLAN:  Fairly stable anxiety/depression. Will continue to give pristiq more time--rf'd med today. Continue with counseling with Nunzio Cobbs. F/u with me in 6-8 wks.  An After Visit Summary was printed and given to the patient.

## 2013-10-27 NOTE — Progress Notes (Signed)
Pre visit review using our clinic review tool, if applicable. No additional management support is needed unless otherwise documented below in the visit note. 

## 2013-10-30 ENCOUNTER — Telehealth: Payer: Self-pay | Admitting: Family Medicine

## 2013-10-30 NOTE — Telephone Encounter (Signed)
Patient states that [facility] did not get her rx refill.  Looks like it was sent into pharmacy 10/27/13.  I called [facility] and renewed 90 day Rx x 4 refills as previously Rx'ed.

## 2013-11-19 DIAGNOSIS — F4323 Adjustment disorder with mixed anxiety and depressed mood: Secondary | ICD-10-CM | POA: Diagnosis not present

## 2013-11-26 NOTE — Telephone Encounter (Signed)
Lab order

## 2013-12-24 ENCOUNTER — Encounter: Payer: Self-pay | Admitting: Family Medicine

## 2013-12-24 ENCOUNTER — Ambulatory Visit (INDEPENDENT_AMBULATORY_CARE_PROVIDER_SITE_OTHER): Payer: Medicare Other | Admitting: Family Medicine

## 2013-12-24 VITALS — BP 149/89 | HR 61 | Temp 99.6°F | Resp 18 | Ht 61.5 in | Wt 158.0 lb

## 2013-12-24 DIAGNOSIS — F411 Generalized anxiety disorder: Secondary | ICD-10-CM

## 2013-12-24 DIAGNOSIS — I10 Essential (primary) hypertension: Secondary | ICD-10-CM

## 2013-12-24 DIAGNOSIS — E785 Hyperlipidemia, unspecified: Secondary | ICD-10-CM

## 2013-12-24 DIAGNOSIS — Z23 Encounter for immunization: Secondary | ICD-10-CM | POA: Diagnosis not present

## 2013-12-24 DIAGNOSIS — F4310 Post-traumatic stress disorder, unspecified: Secondary | ICD-10-CM

## 2013-12-24 DIAGNOSIS — F329 Major depressive disorder, single episode, unspecified: Secondary | ICD-10-CM | POA: Diagnosis not present

## 2013-12-24 LAB — COMPREHENSIVE METABOLIC PANEL
ALBUMIN: 4.4 g/dL (ref 3.5–5.2)
ALT: 21 U/L (ref 0–35)
AST: 24 U/L (ref 0–37)
Alkaline Phosphatase: 77 U/L (ref 39–117)
BILIRUBIN TOTAL: 0.8 mg/dL (ref 0.2–1.2)
BUN: 14 mg/dL (ref 6–23)
CO2: 29 meq/L (ref 19–32)
Calcium: 10.6 mg/dL — ABNORMAL HIGH (ref 8.4–10.5)
Chloride: 103 mEq/L (ref 96–112)
Creatinine, Ser: 1 mg/dL (ref 0.4–1.2)
GFR: 59.56 mL/min — ABNORMAL LOW (ref >60.00)
GLUCOSE: 81 mg/dL (ref 70–99)
Potassium: 4.5 mEq/L (ref 3.5–5.1)
Sodium: 140 mEq/L (ref 135–145)
Total Protein: 7.1 g/dL (ref 6.0–8.3)

## 2013-12-24 LAB — CBC WITH DIFFERENTIAL/PLATELET
BASOS PCT: 0.6 % (ref 0.0–3.0)
Basophils Absolute: 0 10*3/uL (ref 0.0–0.1)
EOS PCT: 2.4 % (ref 0.0–5.0)
Eosinophils Absolute: 0.2 10*3/uL (ref 0.0–0.7)
HEMATOCRIT: 43.6 % (ref 36.0–46.0)
Hemoglobin: 14.7 g/dL (ref 12.0–15.0)
LYMPHS ABS: 1.7 10*3/uL (ref 0.7–4.0)
Lymphocytes Relative: 22.6 % (ref 12.0–46.0)
MCHC: 33.8 g/dL (ref 30.0–36.0)
MCV: 89.9 fl (ref 78.0–100.0)
Monocytes Absolute: 0.5 10*3/uL (ref 0.1–1.0)
Monocytes Relative: 6.7 % (ref 3.0–12.0)
Neutro Abs: 5 10*3/uL (ref 1.4–7.7)
Neutrophils Relative %: 67.7 % (ref 43.0–77.0)
PLATELETS: 268 10*3/uL (ref 150.0–400.0)
RBC: 4.85 Mil/uL (ref 3.87–5.11)
RDW: 13.3 % (ref 11.5–15.5)
WBC: 7.4 10*3/uL (ref 4.0–10.5)

## 2013-12-24 MED ORDER — AMLODIPINE BESYLATE 5 MG PO TABS: 5.0000 mg | ORAL_TABLET | Freq: Every day | ORAL | Status: DC

## 2013-12-24 MED ORDER — LOSARTAN POTASSIUM 100 MG PO TABS: ORAL_TABLET | ORAL | Status: DC

## 2013-12-24 MED ORDER — MECLIZINE HCL 25 MG PO TABS: 25.0000 mg | ORAL_TABLET | Freq: Three times a day (TID) | ORAL | Status: DC | PRN

## 2013-12-24 NOTE — Patient Instructions (Signed)
Take pristiq 50mg  every other day for 10 doses, then stop.

## 2013-12-24 NOTE — Progress Notes (Signed)
OFFICE NOTE  12/24/2013  CC:  Chief Complaint  Patient presents with  . Follow-up  . Medication Refill     HPI: Patient is a 73 y.o. Caucasian female who is here for 2 mo f/u chronic anxiety and depression, HTN, hypothyroidism, hyperlipidemia.   Home bp's occ checked: 140s/80s.  She rarely uses her clonazepam. She has decided to start seeing her counselor, Nunzio Cobbs, on a prn basis now. She is still constantly struggling with emotional lability, very anxious, irritable, thinks about past traumatic experience and feels excessive guilt constantly, short term memory problems, low self esteem, depressed mood, anhedonia, mental "cloudiness".  Fatigued all the time.  Detached from personal relationships, poor concentration.  Pristiq has not helped, nor did citalopram, paxil, or effexor before it.  ROS: about 3 yr hx of brief feeling of lightheadedness intermittently, mostly noted with bending over and rising back up, but occasionally seems to happen when she drives.  Denies vertigo. Says meclizine helped in the past and she can't recall why she stopped it.  She has her husband drive all the time now.  Pertinent PMH:  Past medical, surgical, social, and family history reviewed and no changes are noted since last office visit.  MEDS: Not taking celexa listed below Outpatient Prescriptions Prior to Visit  Medication Sig Dispense Refill  . amLODipine (NORVASC) 2.5 MG tablet TAKE 1 TABLET BY MOUTH EVERY DAY  90 tablet  2  . atorvastatin (LIPITOR) 20 MG tablet TAKE 1 TABLET BY MOUTH EVERY DAY  90 tablet  1  . calcium carbonate (OS-CAL - DOSED IN MG OF ELEMENTAL CALCIUM) 1250 MG tablet Take 1 tablet by mouth.      . clonazePAM (KLONOPIN) 1 MG tablet 1/2-1 tab po q12h prn anxiety and for sleep hs  180 tablet  0  . clopidogrel (PLAVIX) 75 MG tablet TAKE 1 TABLET BY MOUTH EVERY DAY  90 tablet  4  . desvenlafaxine (PRISTIQ) 50 MG 24 hr tablet Take 1 tablet (50 mg total) by mouth daily.  90  tablet  4  . levothyroxine (SYNTHROID, LEVOTHROID) 25 MCG tablet Take 1 tablet (25 mcg total) by mouth daily before breakfast.  90 tablet  1  . losartan (COZAAR) 100 MG tablet TAKE 1 TABLET BY MOUTH EVERY DAY  90 tablet  1  . Multiple Vitamins-Minerals (WOMENS MULTIVITAMIN PLUS) TABS Take by mouth. With Calcium, Zinc & Iron.      . Omega-3 Fatty Acids (FISH OIL) 1200 MG CAPS Take 1,200 mg by mouth daily.        . citalopram (CELEXA) 40 MG tablet weening off this med as of 09/16/13  90 tablet  1  . methocarbamol (ROBAXIN) 500 MG tablet        No facility-administered medications prior to visit.    PE: Blood pressure 149/89, pulse 61, temperature 99.6 F (37.6 C), temperature source Temporal, resp. rate 18, height 5' 1.5" (1.562 m), weight 158 lb (71.668 kg), SpO2 96.00%. Gen: Alert, well appearing.  Patient is oriented to person, place, time, and situation. AFFECT: nervous, tears up at times, lucid thought and speech. YQM:VHQI: no injection, icteris, swelling, or exudate.  EOMI, PERRLA. Mouth: lips without lesion/swelling.  Oral mucosa pink and moist. Oropharynx without erythema, exudate, or swelling.  Neck - No masses or thyromegaly or limitation in range of motion CV: RRR, no m/r/g.   LUNGS: CTA bilat, nonlabored resps, good aeration in all lung fields. EXT: no clubbing, cyanosis, or edema.  Neuro: CN 2-12 intact  bilaterally, strength 5/5 in proximal and distal upper extremities and lower extremities bilaterally.  No sensory deficits.  No tremor.  No disdiadochokinesis.  No ataxia.  Upper extremity and lower extremity DTRs symmetric.  No pronator drift.   IMPRESSION AND PLAN:  1) HTN; Increase amlodipine to 5mg  qd, continue losartan 100mg  qd, DASH diet handout reviewed and given to pt today.  CMET today.  2) Hyperlipidemia; The current medical regimen is effective;  continue present plan and medications.  3) Anxiety/depression/PTSD: no doing very well.  I recommended she start seeing a  psychiatrist since I have been unable to help her with my medical mgmt.  I encouraged her to keep doing regularly scheduled counseling but is seems she is leaving this alone for the time being.  4) Intermittent vague disquilibrium: suspect psychiatric underpinnings.  However, with this sx's accompanied by chronic fatigue, will check CBC today.  RF'd meclizine 25mg  qid prn.  5) Hypothyroidism: ongoing monitoring/management by endocrinologist, Dr. Chalmers Cater.  6) Prev health care: prevnar 13 IM today.  Spent 30 min with pt today, with >50% of this time spent in counseling and care coordination regarding the above problems.  An After Visit Summary was printed and given to the patient.  FOLLOW UP: 4 mo

## 2013-12-24 NOTE — Progress Notes (Signed)
Pre visit review using our clinic review tool, if applicable. No additional management support is needed unless otherwise documented below in the visit note. 

## 2014-01-16 ENCOUNTER — Other Ambulatory Visit: Payer: Self-pay | Admitting: Dermatology

## 2014-01-16 DIAGNOSIS — L909 Atrophic disorder of skin, unspecified: Secondary | ICD-10-CM | POA: Diagnosis not present

## 2014-01-16 DIAGNOSIS — B079 Viral wart, unspecified: Secondary | ICD-10-CM | POA: Diagnosis not present

## 2014-01-16 DIAGNOSIS — D485 Neoplasm of uncertain behavior of skin: Secondary | ICD-10-CM | POA: Diagnosis not present

## 2014-01-16 DIAGNOSIS — L919 Hypertrophic disorder of the skin, unspecified: Secondary | ICD-10-CM | POA: Diagnosis not present

## 2014-01-16 DIAGNOSIS — L80 Vitiligo: Secondary | ICD-10-CM | POA: Diagnosis not present

## 2014-01-16 DIAGNOSIS — D1801 Hemangioma of skin and subcutaneous tissue: Secondary | ICD-10-CM | POA: Diagnosis not present

## 2014-01-16 DIAGNOSIS — L820 Inflamed seborrheic keratosis: Secondary | ICD-10-CM | POA: Diagnosis not present

## 2014-01-16 DIAGNOSIS — L821 Other seborrheic keratosis: Secondary | ICD-10-CM | POA: Diagnosis not present

## 2014-01-21 ENCOUNTER — Other Ambulatory Visit: Payer: Self-pay | Admitting: Endocrinology

## 2014-01-21 DIAGNOSIS — E039 Hypothyroidism, unspecified: Secondary | ICD-10-CM | POA: Diagnosis not present

## 2014-01-21 DIAGNOSIS — E049 Nontoxic goiter, unspecified: Secondary | ICD-10-CM

## 2014-01-21 DIAGNOSIS — E038 Other specified hypothyroidism: Secondary | ICD-10-CM | POA: Diagnosis not present

## 2014-01-22 ENCOUNTER — Encounter: Payer: Self-pay | Admitting: Family Medicine

## 2014-01-23 ENCOUNTER — Telehealth: Payer: Self-pay | Admitting: Family Medicine

## 2014-01-23 MED ORDER — AMLODIPINE BESYLATE 5 MG PO TABS: 5.0000 mg | ORAL_TABLET | Freq: Every day | ORAL | Status: DC

## 2014-01-23 NOTE — Telephone Encounter (Signed)
Patient needed a 90 day supply of her amlodipine sent to pharmacy.  Insurance doesn't pay well for the 30 day supply.   RX sent into pharmacy.  Also, patient wanted to let you know that Dr Chalmers Cater increased her thyroid medication from 25 mcg to 50 mcg.

## 2014-01-24 ENCOUNTER — Encounter: Payer: Self-pay | Admitting: Family Medicine

## 2014-01-24 NOTE — Telephone Encounter (Signed)
Noted  

## 2014-01-27 DIAGNOSIS — F331 Major depressive disorder, recurrent, moderate: Secondary | ICD-10-CM | POA: Diagnosis not present

## 2014-01-27 DIAGNOSIS — F411 Generalized anxiety disorder: Secondary | ICD-10-CM | POA: Diagnosis not present

## 2014-02-12 DIAGNOSIS — F411 Generalized anxiety disorder: Secondary | ICD-10-CM | POA: Diagnosis not present

## 2014-02-12 DIAGNOSIS — F331 Major depressive disorder, recurrent, moderate: Secondary | ICD-10-CM | POA: Diagnosis not present

## 2014-02-12 DIAGNOSIS — F4310 Post-traumatic stress disorder, unspecified: Secondary | ICD-10-CM | POA: Diagnosis not present

## 2014-02-27 ENCOUNTER — Other Ambulatory Visit: Payer: Self-pay | Admitting: Family Medicine

## 2014-02-27 MED ORDER — ATORVASTATIN CALCIUM 20 MG PO TABS: ORAL_TABLET | ORAL | Status: DC

## 2014-03-06 DIAGNOSIS — F411 Generalized anxiety disorder: Secondary | ICD-10-CM | POA: Diagnosis not present

## 2014-03-06 DIAGNOSIS — F331 Major depressive disorder, recurrent, moderate: Secondary | ICD-10-CM | POA: Diagnosis not present

## 2014-03-06 DIAGNOSIS — F4310 Post-traumatic stress disorder, unspecified: Secondary | ICD-10-CM | POA: Diagnosis not present

## 2014-03-31 DIAGNOSIS — E039 Hypothyroidism, unspecified: Secondary | ICD-10-CM | POA: Diagnosis not present

## 2014-04-02 DIAGNOSIS — H524 Presbyopia: Secondary | ICD-10-CM | POA: Diagnosis not present

## 2014-04-02 DIAGNOSIS — H2510 Age-related nuclear cataract, unspecified eye: Secondary | ICD-10-CM | POA: Diagnosis not present

## 2014-04-03 DIAGNOSIS — F4310 Post-traumatic stress disorder, unspecified: Secondary | ICD-10-CM | POA: Diagnosis not present

## 2014-04-03 DIAGNOSIS — F331 Major depressive disorder, recurrent, moderate: Secondary | ICD-10-CM | POA: Diagnosis not present

## 2014-04-03 DIAGNOSIS — F411 Generalized anxiety disorder: Secondary | ICD-10-CM | POA: Diagnosis not present

## 2014-04-23 ENCOUNTER — Ambulatory Visit: Payer: Medicare Other | Admitting: Family Medicine

## 2014-05-07 DIAGNOSIS — F4310 Post-traumatic stress disorder, unspecified: Secondary | ICD-10-CM | POA: Diagnosis not present

## 2014-05-07 DIAGNOSIS — F331 Major depressive disorder, recurrent, moderate: Secondary | ICD-10-CM | POA: Diagnosis not present

## 2014-05-07 DIAGNOSIS — F411 Generalized anxiety disorder: Secondary | ICD-10-CM | POA: Diagnosis not present

## 2014-07-02 DIAGNOSIS — F331 Major depressive disorder, recurrent, moderate: Secondary | ICD-10-CM | POA: Diagnosis not present

## 2014-07-02 DIAGNOSIS — F411 Generalized anxiety disorder: Secondary | ICD-10-CM | POA: Diagnosis not present

## 2014-07-02 DIAGNOSIS — F4310 Post-traumatic stress disorder, unspecified: Secondary | ICD-10-CM | POA: Diagnosis not present

## 2014-08-14 DIAGNOSIS — M4306 Spondylolysis, lumbar region: Secondary | ICD-10-CM

## 2014-08-14 DIAGNOSIS — M5136 Other intervertebral disc degeneration, lumbar region: Secondary | ICD-10-CM

## 2014-08-14 DIAGNOSIS — M51369 Other intervertebral disc degeneration, lumbar region without mention of lumbar back pain or lower extremity pain: Secondary | ICD-10-CM

## 2014-08-14 HISTORY — DX: Other intervertebral disc degeneration, lumbar region without mention of lumbar back pain or lower extremity pain: M51.369

## 2014-08-14 HISTORY — DX: Spondylolysis, lumbar region: M43.06

## 2014-08-14 HISTORY — DX: Other intervertebral disc degeneration, lumbar region: M51.36

## 2014-08-24 ENCOUNTER — Telehealth: Payer: Self-pay

## 2014-08-24 NOTE — Telephone Encounter (Signed)
Yes, may RF 90 day supply with 1 additional RF.-thx

## 2014-08-24 NOTE — Telephone Encounter (Signed)
Carla Spencer requesting refill on Clopidogrel 75 mg. Last OV 12/24/2013. Can we refill? Please advise.

## 2014-08-25 MED ORDER — CLOPIDOGREL BISULFATE 75 MG PO TABS: ORAL_TABLET | ORAL | Status: DC

## 2014-08-25 NOTE — Telephone Encounter (Signed)
Refill sent.

## 2014-08-27 DIAGNOSIS — F4310 Post-traumatic stress disorder, unspecified: Secondary | ICD-10-CM | POA: Diagnosis not present

## 2014-08-27 DIAGNOSIS — F411 Generalized anxiety disorder: Secondary | ICD-10-CM | POA: Diagnosis not present

## 2014-08-27 DIAGNOSIS — F331 Major depressive disorder, recurrent, moderate: Secondary | ICD-10-CM | POA: Diagnosis not present

## 2014-09-02 ENCOUNTER — Ambulatory Visit (INDEPENDENT_AMBULATORY_CARE_PROVIDER_SITE_OTHER): Payer: Medicare Other | Admitting: Family Medicine

## 2014-09-02 ENCOUNTER — Encounter: Payer: Self-pay | Admitting: Family Medicine

## 2014-09-02 VITALS — BP 147/84 | HR 69 | Temp 98.7°F | Resp 18 | Ht 61.5 in | Wt 162.0 lb

## 2014-09-02 DIAGNOSIS — Z1239 Encounter for other screening for malignant neoplasm of breast: Secondary | ICD-10-CM | POA: Diagnosis not present

## 2014-09-02 DIAGNOSIS — E785 Hyperlipidemia, unspecified: Secondary | ICD-10-CM

## 2014-09-02 DIAGNOSIS — G8929 Other chronic pain: Secondary | ICD-10-CM

## 2014-09-02 DIAGNOSIS — M545 Low back pain: Secondary | ICD-10-CM | POA: Diagnosis not present

## 2014-09-02 DIAGNOSIS — F329 Major depressive disorder, single episode, unspecified: Secondary | ICD-10-CM

## 2014-09-02 DIAGNOSIS — F419 Anxiety disorder, unspecified: Secondary | ICD-10-CM

## 2014-09-02 DIAGNOSIS — I10 Essential (primary) hypertension: Secondary | ICD-10-CM

## 2014-09-02 DIAGNOSIS — F418 Other specified anxiety disorders: Secondary | ICD-10-CM

## 2014-09-02 LAB — LIPID PANEL
Cholesterol: 180 mg/dL (ref 0–200)
HDL: 53.6 mg/dL (ref >39.00)
LDL Cholesterol: 101 mg/dL — ABNORMAL HIGH (ref 0–99)
NonHDL: 126.4
Total CHOL/HDL Ratio: 3
Triglycerides: 129 mg/dL (ref 0.0–149.0)
VLDL: 25.8 mg/dL (ref 0.0–40.0)

## 2014-09-02 NOTE — Patient Instructions (Signed)
Back Exercises These exercises may help you when beginning to rehabilitate your injury. Your symptoms may resolve with or without further involvement from your physician, physical therapist or athletic trainer. While completing these exercises, remember:   Restoring tissue flexibility helps normal motion to return to the joints. This allows healthier, less painful movement and activity.  An effective stretch should be held for at least 30 seconds.  A stretch should never be painful. You should only feel a gentle lengthening or release in the stretched tissue. STRETCH - Extension, Prone on Elbows   Lie on your stomach on the floor, a bed will be too soft. Place your palms about shoulder width apart and at the height of your head.  Place your elbows under your shoulders. If this is too painful, stack pillows under your chest.  Allow your body to relax so that your hips drop lower and make contact more completely with the floor.  Hold this position for __________ seconds.  Slowly return to lying flat on the floor. Repeat __________ times. Complete this exercise __________ times per day.  RANGE OF MOTION - Extension, Prone Press Ups   Lie on your stomach on the floor, a bed will be too soft. Place your palms about shoulder width apart and at the height of your head.  Keeping your back as relaxed as possible, slowly straighten your elbows while keeping your hips on the floor. You may adjust the placement of your hands to maximize your comfort. As you gain motion, your hands will come more underneath your shoulders.  Hold this position __________ seconds.  Slowly return to lying flat on the floor. Repeat __________ times. Complete this exercise __________ times per day.  RANGE OF MOTION- Quadruped, Neutral Spine   Assume a hands and knees position on a firm surface. Keep your hands under your shoulders and your knees under your hips. You may place padding under your knees for  comfort.  Drop your head and point your tail bone toward the ground below you. This will round out your low back like an angry cat. Hold this position for __________ seconds.  Slowly lift your head and release your tail bone so that your back sags into a large arch, like an old horse.  Hold this position for __________ seconds.  Repeat this until you feel limber in your low back.  Now, find your "sweet spot." This will be the most comfortable position somewhere between the two previous positions. This is your neutral spine. Once you have found this position, tense your stomach muscles to support your low back.  Hold this position for __________ seconds. Repeat __________ times. Complete this exercise __________ times per day.  STRETCH - Flexion, Single Knee to Chest   Lie on a firm bed or floor with both legs extended in front of you.  Keeping one leg in contact with the floor, bring your opposite knee to your chest. Hold your leg in place by either grabbing behind your thigh or at your knee.  Pull until you feel a gentle stretch in your low back. Hold __________ seconds.  Slowly release your grasp and repeat the exercise with the opposite side. Repeat __________ times. Complete this exercise __________ times per day.  STRETCH - Hamstrings, Standing  Stand or sit and extend your right / left leg, placing your foot on a chair or foot stool  Keeping a slight arch in your low back and your hips straight forward.  Lead with your chest and   lean forward at the waist until you feel a gentle stretch in the back of your right / left knee or thigh. (When done correctly, this exercise requires leaning only a small distance.)  Hold this position for __________ seconds. Repeat __________ times. Complete this stretch __________ times per day. STRENGTHENING - Deep Abdominals, Pelvic Tilt   Lie on a firm bed or floor. Keeping your legs in front of you, bend your knees so they are both pointed  toward the ceiling and your feet are flat on the floor.  Tense your lower abdominal muscles to press your low back into the floor. This motion will rotate your pelvis so that your tail bone is scooping upwards rather than pointing at your feet or into the floor.  With a gentle tension and even breathing, hold this position for __________ seconds. Repeat __________ times. Complete this exercise __________ times per day.  STRENGTHENING - Abdominals, Crunches   Lie on a firm bed or floor. Keeping your legs in front of you, bend your knees so they are both pointed toward the ceiling and your feet are flat on the floor. Cross your arms over your chest.  Slightly tip your chin down without bending your neck.  Tense your abdominals and slowly lift your trunk high enough to just clear your shoulder blades. Lifting higher can put excessive stress on the low back and does not further strengthen your abdominal muscles.  Control your return to the starting position. Repeat __________ times. Complete this exercise __________ times per day.  STRENGTHENING - Quadruped, Opposite UE/LE Lift   Assume a hands and knees position on a firm surface. Keep your hands under your shoulders and your knees under your hips. You may place padding under your knees for comfort.  Find your neutral spine and gently tense your abdominal muscles so that you can maintain this position. Your shoulders and hips should form a rectangle that is parallel with the floor and is not twisted.  Keeping your trunk steady, lift your right hand no higher than your shoulder and then your left leg no higher than your hip. Make sure you are not holding your breath. Hold this position __________ seconds.  Continuing to keep your abdominal muscles tense and your back steady, slowly return to your starting position. Repeat with the opposite arm and leg. Repeat __________ times. Complete this exercise __________ times per day. Document Released:  08/18/2005 Document Revised: 10/23/2011 Document Reviewed: 11/12/2008 Exit[employer] Patient Information 2015 [employer]. This information is not intended to replace advice given to you by your health care provider. Make sure you discuss any questions you have with your health care provider.  

## 2014-09-02 NOTE — Progress Notes (Signed)
OFFICE VISIT  09/02/2014   CC:  Chief Complaint  Patient presents with  . Follow-up  . Back Pain   HPI:    Patient is a 74 y.o. Caucasian female who presents for 8 mo f/u HTN, CRI stage 2, anxiety/depression, hyperlipidemia. Still having lots of troubles with anxiety and depression/PTSD, plans on starting couseling with the [facility] in [place], still seeing psychiatrist.  This has been disabling in the past and is currently making her struggle a lot.    Hx of on/off LBP, now constant for the last 4 mo across LB in center and to L and R in band-like distribution along waist line.  No radiation.   3/10 constant, nothing makes it better or worse.  Hurts more when she stands for long periods.  No paresthesias in legs. Right groin area intermittently painful as well, she recalls seeing no bulging.  Onse she saw a bruise in the area that came and went. She does no regular exercise or stretching.  Takes bp med and statin regularly, no side effects. No home bp's to report.  ROS: no CP, no fevers, no cough or SOB.  No focal weakness.  No hematochezia, no melena, no hematuria or easy/excessive skin bruising.   Past Medical History  Diagnosis Date  . Vertigo   . Anxiety and depression   . Hyperlipidemia   . TIA (transient ischemic attack)     patient on plavix,  saw Dr. Isabell Jarvis at [facility]hospital in [place]  . Arthritis   . PFO (patent foramen ovale) echo 03/21/05    "hole in heart" saw Dr. Sharee Holster [phone] [facility] hospital.  Plavix med mgmt.  . Goiter     Lobectomy (left) of thyroid for benign nodule.  Right lobe nodules followed by Dr. Chalmers Cater (FNA 2012 benign goiter) with ultrasounds and have been stable, most recently 01/10/13.    Marland Kitchen Urinary tract infection     hx of  . GERD (gastroesophageal reflux disease)   . Bilateral carpal tunnel syndrome   . Colitis     hx of  . Cataracts, bilateral   . Hypertension   . Rectocele   . H/O adenomatous polyp of colon 2012   5 yr recall  . Chronic renal insufficiency, stage II (mild) 2015    CrCl about 60 ml/min  . Subclinical hypothyroidism     Dr. Chalmers Cater has her on 25 mcg synthroid qd as of 01/2014    Past Surgical History  Procedure Laterality Date  . Abdominal hysterectomy    . Bladder suspension    . Thyroidectomy      Left lobectomy  . Rectocele repair      purse string  . Colonoscopy w/ polypectomy  04/2011    +Diverticulosis.  Adenoma--recall 5 yrs  . Breast surgery      "breast lumps removed"  . Dilation and curettage of uterus    . 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 amLODipine (NORVASC) 5 MG tablet Take 1 tablet (5 mg total) by mouth daily. 90 tablet 2  . atorvastatin (LIPITOR) 20 MG tablet TAKE 1 TABLET BY MOUTH EVERY DAY 90 tablet 1  . calcium carbonate (OS-CAL - DOSED IN MG OF ELEMENTAL CALCIUM) 1250 MG tablet Take 1 tablet by mouth.    Marland Kitchen  clopidogrel (PLAVIX) 75 MG tablet TAKE 1 TABLET BY MOUTH EVERY DAY 90 tablet 1  . levothyroxine (SYNTHROID, LEVOTHROID) 50 MCG tablet Take 50 mcg by mouth daily before breakfast. Dr. Chalmers Cater increased her from 25 to 50 mcg daily approx 01/18/14-PM    . losartan (COZAAR) 100 MG tablet TAKE 1 TABLET BY MOUTH EVERY DAY 90 tablet 4  . Multiple Vitamins-Minerals (WOMENS MULTIVITAMIN PLUS) TABS Take by mouth. With Calcium, Zinc & Iron.    . Omega-3 Fatty Acids (FISH OIL) 1200 MG CAPS Take 1,200 mg by mouth daily.      . meclizine (ANTIVERT) 25 MG tablet Take 1 tablet (25 mg total) by mouth 3 (three) times daily as needed for dizziness. (Patient not taking: Reported on 09/02/2014) 60 tablet 3  . clonazePAM (KLONOPIN) 1 MG tablet 1/2-1 tab po q12h prn anxiety and for sleep hs (Patient not taking: Reported on 09/02/2014) 180 tablet 0  . desvenlafaxine (PRISTIQ) 50  MG 24 hr tablet Take 1 tablet (50 mg total) by mouth daily. 90 tablet 4   No facility-administered medications prior to visit.    Allergies  Allergen Reactions  . Sulfa Drugs Cross Reactors Rash   ROS As per HPI  PE: Blood pressure 147/84, pulse 69, temperature 98.7 F (37.1 C), temperature source Temporal, resp. rate 18, height 5' 1.5" (1.562 m), weight 162 lb (73.483 kg), SpO2 98 %.  Pt examined with CMA Jacklynn Ganong as chaperone. Gen: Alert, well appearing.  Patient is oriented to person, place, time, and situation. Back: diffuse mild TTP across lower lumbar spine diffusely.  No SI or gluteal area TTP.  ROM of L spine intact but mild pulling sensation with ROM. Sitting SLR neg bilat.  LE strength 5/5 prox and dist bilat, DTRS in knees and ankles intact/symmetric. ABD: soft, NT/ND.  No groin bulging or tenderness.   LABS:  None today RECENT: Lab Results  Component Value Date   CHOL 189 05/01/2012   HDL 49 05/01/2012   LDLCALC 115* 05/01/2012   TRIG 123 05/01/2012   CHOLHDL 3.9 05/01/2012     Chemistry      Component Value Date/Time   NA 140 12/24/2013 1124   K 4.5 12/24/2013 1124   CL 103 12/24/2013 1124   CO2 29 12/24/2013 1124   BUN 14 12/24/2013 1124   CREATININE 1.0 12/24/2013 1124   CREATININE 0.86 05/01/2012 1222      Component Value Date/Time   CALCIUM 10.6* 12/24/2013 1124   ALKPHOS 77 12/24/2013 1124   AST 24 12/24/2013 1124   ALT 21 12/24/2013 1124   BILITOT 0.8 12/24/2013 1124     Lab Results  Component Value Date   WBC 7.4 12/24/2013   HGB 14.7 12/24/2013   HCT 43.6 12/24/2013   MCV 89.9 12/24/2013   PLT 268.0 12/24/2013   Lab Results  Component Value Date   TSH 2.50 09/23/2012   IMPRESSION AND PLAN:  1) chronic low back pain: musculoskeletal. No sign of HNP or SI joint problem. Will obtain L spine plain films.  Gave pt print out of back stretches to do.  Recommended tylenol [phone] mg q6h prn pain, avoid NSAIDs given pt's use of  plavix.  2) Chronic anxiety/PTSD/Depression: continue to work with psychiatrist and I encouraged her to try the new counselor that psychiatrist suggested (the [facility]).  No med changes made today.  3) Hyperlipidemia: compliant with meds, tolerating med fine. Recheck FLP today.  4) HTN: The current medical regimen is effective;  continue present plan and medications.  5) Preventative health care: screening mammogram ordered today.  An After Visit Summary was printed and given to the patient.  FOLLOW UP: Return in about 6 months (around 03/03/2015) for routine chronic illness f/u.

## 2014-09-02 NOTE — Progress Notes (Signed)
Pre visit review using our clinic review tool, if applicable. No additional management support is needed unless otherwise documented below in the visit note. 

## 2014-09-18 ENCOUNTER — Other Ambulatory Visit: Payer: Self-pay | Admitting: Family Medicine

## 2014-09-18 DIAGNOSIS — Z1231 Encounter for screening mammogram for malignant neoplasm of breast: Secondary | ICD-10-CM

## 2014-09-23 ENCOUNTER — Ambulatory Visit
Admission: RE | Admit: 2014-09-23 | Discharge: 2014-09-23 | Disposition: A | Discharge status: Home / Self Care | Payer: Medicare Other | Source: Ambulatory Visit | Attending: Family Medicine | Admitting: Family Medicine

## 2014-09-23 DIAGNOSIS — G8929 Other chronic pain: Secondary | ICD-10-CM

## 2014-09-23 DIAGNOSIS — M4316 Spondylolisthesis, lumbar region: Secondary | ICD-10-CM | POA: Diagnosis not present

## 2014-09-23 DIAGNOSIS — M47816 Spondylosis without myelopathy or radiculopathy, lumbar region: Secondary | ICD-10-CM | POA: Diagnosis not present

## 2014-09-23 DIAGNOSIS — M545 Low back pain, unspecified: Secondary | ICD-10-CM

## 2014-09-23 DIAGNOSIS — M5136 Other intervertebral disc degeneration, lumbar region: Secondary | ICD-10-CM | POA: Diagnosis not present

## 2014-09-25 ENCOUNTER — Encounter: Payer: Self-pay | Admitting: Family Medicine

## 2014-10-01 DIAGNOSIS — M25551 Pain in right hip: Secondary | ICD-10-CM | POA: Diagnosis not present

## 2014-10-06 ENCOUNTER — Other Ambulatory Visit: Payer: Self-pay | Admitting: Family Medicine

## 2014-10-06 ENCOUNTER — Ambulatory Visit
Admission: RE | Admit: 2014-10-06 | Discharge: 2014-10-06 | Disposition: A | Discharge status: Home / Self Care | Payer: Medicare Other | Source: Ambulatory Visit | Attending: Family Medicine | Admitting: Family Medicine

## 2014-10-06 DIAGNOSIS — Z1231 Encounter for screening mammogram for malignant neoplasm of breast: Secondary | ICD-10-CM

## 2014-10-06 DIAGNOSIS — M47816 Spondylosis without myelopathy or radiculopathy, lumbar region: Secondary | ICD-10-CM | POA: Diagnosis not present

## 2014-10-08 DIAGNOSIS — M25551 Pain in right hip: Secondary | ICD-10-CM | POA: Diagnosis not present

## 2014-10-08 DIAGNOSIS — M47816 Spondylosis without myelopathy or radiculopathy, lumbar region: Secondary | ICD-10-CM | POA: Diagnosis not present

## 2014-10-20 ENCOUNTER — Telehealth: Payer: Self-pay | Admitting: Family Medicine

## 2014-10-20 NOTE — Telephone Encounter (Signed)
LMOVM for pt to return call. We need to know which town the [facility] is in.

## 2014-10-20 NOTE — Telephone Encounter (Signed)
Pt. Called from [place] and needs a refill on her Lipitor 20mg . Please call into [facility] in [place]. There number is [phone] or Fax: [phone].

## 2014-10-20 NOTE — Telephone Encounter (Signed)
Called Rx into pharmacy per pt request.  LMOM to notify patient.

## 2014-10-21 NOTE — Telephone Encounter (Signed)
duplicate note opened

## 2014-10-21 NOTE — Telephone Encounter (Signed)
Note opened on accident.

## 2014-11-16 DIAGNOSIS — M25551 Pain in right hip: Secondary | ICD-10-CM | POA: Diagnosis not present

## 2014-11-16 DIAGNOSIS — M47816 Spondylosis without myelopathy or radiculopathy, lumbar region: Secondary | ICD-10-CM | POA: Diagnosis not present

## 2014-11-16 DIAGNOSIS — M4316 Spondylolisthesis, lumbar region: Secondary | ICD-10-CM | POA: Diagnosis not present

## 2014-11-16 DIAGNOSIS — M7061 Trochanteric bursitis, right hip: Secondary | ICD-10-CM | POA: Diagnosis not present

## 2014-11-17 ENCOUNTER — Other Ambulatory Visit: Payer: Self-pay | Admitting: Family Medicine

## 2014-11-19 DIAGNOSIS — F411 Generalized anxiety disorder: Secondary | ICD-10-CM | POA: Diagnosis not present

## 2014-11-19 DIAGNOSIS — F4310 Post-traumatic stress disorder, unspecified: Secondary | ICD-10-CM | POA: Diagnosis not present

## 2014-11-19 DIAGNOSIS — F331 Major depressive disorder, recurrent, moderate: Secondary | ICD-10-CM | POA: Diagnosis not present

## 2014-11-26 ENCOUNTER — Other Ambulatory Visit: Payer: Self-pay | Admitting: Family Medicine

## 2014-11-26 DIAGNOSIS — H5203 Hypermetropia, bilateral: Secondary | ICD-10-CM | POA: Diagnosis not present

## 2014-11-26 DIAGNOSIS — H2513 Age-related nuclear cataract, bilateral: Secondary | ICD-10-CM | POA: Diagnosis not present

## 2014-11-26 MED ORDER — ATORVASTATIN CALCIUM 20 MG PO TABS: 20.0000 mg | ORAL_TABLET | Freq: Every day | ORAL | Status: DC

## 2015-01-04 ENCOUNTER — Other Ambulatory Visit: Payer: Self-pay | Admitting: *Deleted

## 2015-01-04 MED ORDER — AMLODIPINE BESYLATE 5 MG PO TABS: 5.0000 mg | ORAL_TABLET | Freq: Every day | ORAL | Status: DC

## 2015-01-04 NOTE — Telephone Encounter (Signed)
Fax from [facility]) requesting refill for Norvasc 5mg  take 1 tab po daily. LOV 09/02/14, up coming ov 03/03/15, last written: #90 01/23/14 w/ 2RF. Rx sent #90 w/ 2RF.

## 2015-01-15 ENCOUNTER — Ambulatory Visit (INDEPENDENT_AMBULATORY_CARE_PROVIDER_SITE_OTHER): Payer: Medicare Other | Admitting: Family Medicine

## 2015-01-15 ENCOUNTER — Encounter: Payer: Self-pay | Admitting: Family Medicine

## 2015-01-15 ENCOUNTER — Ambulatory Visit
Admission: RE | Admit: 2015-01-15 | Discharge: 2015-01-15 | Disposition: A | Discharge status: Home / Self Care | Payer: Medicare Other | Source: Ambulatory Visit | Attending: Endocrinology | Admitting: Endocrinology

## 2015-01-15 VITALS — BP 132/86 | HR 57 | Temp 97.9°F | Resp 16 | Wt 159.0 lb

## 2015-01-15 DIAGNOSIS — M545 Low back pain, unspecified: Secondary | ICD-10-CM

## 2015-01-15 DIAGNOSIS — G8929 Other chronic pain: Secondary | ICD-10-CM | POA: Diagnosis not present

## 2015-01-15 DIAGNOSIS — F39 Unspecified mood [affective] disorder: Secondary | ICD-10-CM | POA: Diagnosis not present

## 2015-01-15 DIAGNOSIS — F411 Generalized anxiety disorder: Secondary | ICD-10-CM

## 2015-01-15 DIAGNOSIS — R312 Other microscopic hematuria: Secondary | ICD-10-CM

## 2015-01-15 DIAGNOSIS — E049 Nontoxic goiter, unspecified: Secondary | ICD-10-CM

## 2015-01-15 DIAGNOSIS — E042 Nontoxic multinodular goiter: Secondary | ICD-10-CM | POA: Diagnosis not present

## 2015-01-15 DIAGNOSIS — R3129 Other microscopic hematuria: Secondary | ICD-10-CM

## 2015-01-15 LAB — POCT URINALYSIS DIPSTICK
Glucose, UA: NEGATIVE
LEUKOCYTES UA: NEGATIVE
NITRITE UA: NEGATIVE
PROTEIN UA: NEGATIVE
Urobilinogen, UA: 0.2
pH, UA: 5

## 2015-01-15 MED ORDER — LAMOTRIGINE 25 MG PO TABS: ORAL_TABLET | ORAL | Status: DC

## 2015-01-15 MED ORDER — MECLIZINE HCL 25 MG PO TABS: 25.0000 mg | ORAL_TABLET | Freq: Three times a day (TID) | ORAL | Status: DC | PRN

## 2015-01-15 MED ORDER — LAMOTRIGINE 50 MG PO TBDP: ORAL_TABLET | ORAL | Status: DC

## 2015-01-15 NOTE — Progress Notes (Signed)
Pre visit review using our clinic review tool, if applicable. No additional management support is needed unless otherwise documented below in the visit note. 

## 2015-01-15 NOTE — Progress Notes (Signed)
OFFICE VISIT  01/15/2015   CC:  Chief Complaint  Patient presents with  . Back Pain    with hip and groin pain  . Medication Problem    has a few questions   HPI:    Patient is a 74 y.o. Caucasian female who presents for ongoing pain in LB, across entire LB diffusely, occ rad into R groin and down R leg. Saw Dr. French Ana since I last saw her and sounds like some spondylosis w/spondylolisthesis was noted (no records available--this is from patient's description) and he sent her to another specialist in his office for consideration of "hip injection".  Pt chose to hold off on this injection for now, admits she is fearful of these type of injections, asks me if I think she should get the injection.  Apparently the injection is intended to be potential diagnostic and therapeutic, since it is unclear to ortho exactly the etiology of her right hip/groin pain.    Also with mildly painful, red rash under abd pannus lately.  She applied her husband's rx steroid cream once yesterday and it is already much improved.    Has constipation, has to strain a lot to get it out, has to do digital maneuver to get BM out sometimes. Is not on stool softener or laxative at this time.    PSYCH: still suffering from a lot of anxiety, depression, PTSD sx's.  Her neuropsych eval has helped open her eyes to the way she tends to think and behave.  She was given permission to return to counselor on prn basis now.  She decided to go off venlafaxine about 2 mo ago, admits she felt better on it, coped better. Her psychiatrist gave her bupropion and she took one dose and it made her feel immediately more negative and angry so she stopped it.    Past Medical History  Diagnosis Date  . Vertigo   . Anxiety and depression   . Hyperlipidemia   . TIA (transient ischemic attack)     patient on plavix,  saw Dr. Isabell Jarvis at [facility].hospital in [place]  . Arthritis   . PFO (patent foramen ovale) echo 03/21/05    "hole in  heart" saw Dr. Sharee Holster [phone] [facility]. hospital.  Plavix med mgmt.  . Goiter     Lobectomy (left) of thyroid for benign nodule.  Right lobe nodules followed by Dr. Chalmers Cater (FNA 2012 benign goiter) with ultrasounds and have been stable, most recently 01/10/13.    Marland Kitchen Urinary tract infection     hx of  . GERD (gastroesophageal reflux disease)   . Bilateral carpal tunnel syndrome   . Colitis     hx of  . Cataracts, bilateral   . Hypertension   . Rectocele   . H/O adenomatous polyp of colon 2012    5 yr recall  . Chronic renal insufficiency, stage II (mild) 2015    CrCl about 60 ml/min  . Subclinical hypothyroidism     Dr. Chalmers Cater has her on 25 mcg synthroid qd as of 01/2014  . Lumbar spondylolysis 2016    L5: with grade I spondylolisthesis L5 on S1  . DDD (degenerative disc disease), lumbar 2016    L1-2 and L5-S1 fairly severe    Past Surgical History  Procedure Laterality Date  . Abdominal hysterectomy    . Bladder suspension    . Thyroidectomy      Left lobectomy  . Rectocele repair      purse string  .  Colonoscopy w/ polypectomy  04/2011    +Diverticulosis.  Adenoma--recall 5 yrs  . Breast surgery      "breast lumps removed"  . Dilation and curettage of uterus    . Total knee arthroplasty  07/26/2012    Procedure: TOTAL KNEE ARTHROPLASTY;  Surgeon: Yvette Rack., MD;  Location: [place];  Service: Orthopedics;  Laterality: Right;   MEDS: not taking effexor listed below Outpatient Prescriptions Prior to Visit  Medication Sig Dispense Refill  . amLODipine (NORVASC) 5 MG tablet Take 1 tablet (5 mg total) by mouth daily. 90 tablet 2  . atorvastatin (LIPITOR) 20 MG tablet Take 1 tablet (20 mg total) by mouth daily. 90 tablet 1  . calcium carbonate (OS-CAL - DOSED IN MG OF ELEMENTAL CALCIUM) 1250 MG tablet Take 1 tablet by mouth.    . clopidogrel (PLAVIX) 75 MG tablet TAKE 1 TABLET BY MOUTH EVERY DAY 90 tablet 1  . levothyroxine (SYNTHROID, LEVOTHROID) 50 MCG tablet  Take 50 mcg by mouth daily before breakfast. Dr. Chalmers Cater increased her from 25 to 50 mcg daily approx 01/18/14-PM    . losartan (COZAAR) 100 MG tablet TAKE 1 TABLET BY MOUTH EVERY DAY 90 tablet 4  . Multiple Vitamins-Minerals (WOMENS MULTIVITAMIN PLUS) TABS Take by mouth. With Calcium, Zinc & Iron.    . Omega-3 Fatty Acids (FISH OIL) 1200 MG CAPS Take 1,200 mg by mouth daily.      Marland Kitchen venlafaxine XR (EFFEXOR-XR) 75 MG 24 hr capsule     . meclizine (ANTIVERT) 25 MG tablet Take 1 tablet (25 mg total) by mouth 3 (three) times daily as needed for dizziness. (Patient not taking: Reported on 01/15/2015) 60 tablet 3   No facility-administered medications prior to visit.    Allergies  Allergen Reactions  . Sulfa Drugs Cross Reactors Rash    ROS As per HPI  PE: Blood pressure 132/86, pulse 57, temperature 97.9 F (36.6 C), temperature source Oral, resp. rate 16, weight 159 lb (72.122 kg), SpO2 97 %. Gen: Alert, well appearing.  Patient is oriented to person, place, time, and situation. Affect: very talkative, nervous, but smiling and overall pleasant.  A few times she almost teared up while talking about something emotional. Thought and speech are lucid. SKIN: lower abd shows pinkish papular rash under pannus.  No erythema or maceration.  NO vesicles or pustules.   LABS:  CC UA showed trace intact blood but otherwise was normal  IMPRESSION AND PLAN:  1) Lumbago with right sided radicular symptoms.  Question of some separate hip pathology. Ortho managing things (will request records), and I reassured pt and told her that I would follow through with their recommendation of right hip injection to help with diagnosis. Pt said she would call their office and get this set up again. She gave a urine sample for UA b/c she wanted to make sure her low back pains weren't a sign of UTI.   I sent her urine for c/s for completeness but I did not start any meds based on her sx's or the UA today.  2) Mood  disorder, possible bipolar disorder. She describes mixed-type episode symptoms lately + has had either sub-optimal response OR adverse response to antidepressant by itself. I recommended a trial of lamotrigine: start at 25mg  qd and titrate up by doubling total daily dose q2 weeks. Will get her to the 100mg  bid dosing and re-evaluate her. Therapeutic expectations and side effect profile (esp rash) of medication discussed today.  Patient's  questions answered.  3) Mild skin inflammation/intertrigo in lower abd pannus region: no sign of fungal infection or bacterial infection. She'll continue her husband's rx steroid cream prn.  Discussed need to aerate area, keep it dry, monitor for worsening.   FOLLOW UP: Return for keep f/u appt already set for July 2016.

## 2015-01-15 NOTE — Patient Instructions (Signed)
Lamotrigine (lamictal): Take one 25mg  tab once a day for 14d, then increase to 1 tab twice daily for 14d--this will finish your first month's-worth of pills. Then fill rx for 50mg  tabs: take 1 tab twice a day for 14d, then increase to 2 tabs twice a day. (I will see you back in office before you finish the pills in this rx).

## 2015-01-16 LAB — URINE CULTURE

## 2015-01-27 DIAGNOSIS — M25551 Pain in right hip: Secondary | ICD-10-CM | POA: Diagnosis not present

## 2015-02-01 DIAGNOSIS — E039 Hypothyroidism, unspecified: Secondary | ICD-10-CM | POA: Diagnosis not present

## 2015-02-01 DIAGNOSIS — E049 Nontoxic goiter, unspecified: Secondary | ICD-10-CM | POA: Diagnosis not present

## 2015-02-13 ENCOUNTER — Other Ambulatory Visit: Payer: Self-pay | Admitting: Family Medicine

## 2015-02-13 MED ORDER — LAMOTRIGINE 25 MG PO TABS: ORAL_TABLET | ORAL | Status: DC

## 2015-02-13 NOTE — Progress Notes (Signed)
Patient at [place] to pick up lamictal. 50mg  tablet that was written was $500 per pill.   We changed her rx to reflect 50mg  daily for 2 weeks then 50mg  BID after that. Only 1 months written.

## 2015-02-15 NOTE — Progress Notes (Signed)
Thank you :)

## 2015-02-17 ENCOUNTER — Telehealth: Payer: Self-pay | Admitting: Family Medicine

## 2015-02-17 ENCOUNTER — Other Ambulatory Visit: Payer: Self-pay | Admitting: Family Medicine

## 2015-02-17 MED ORDER — OXYCODONE-ACETAMINOPHEN 5-325 MG PO TABS: ORAL_TABLET | ORAL | Status: DC

## 2015-02-17 NOTE — Telephone Encounter (Signed)
Please contact pt in regards to note below.

## 2015-02-17 NOTE — Telephone Encounter (Signed)
Patient aware of medication instructions.  Pt's husband will p/u pain RX.

## 2015-02-17 NOTE — Telephone Encounter (Signed)
Pt called to let Dr. Anitra Lauth know that her medication Lamtrigine makes her feel bad. Crabby, irritable and makes her nose run. SHe would like to d/c this medication but would like Dr. Anitra Lauth to call and instruct her on how to d/c this med and then needs a new RX for pain medication.  Her number is [phone].

## 2015-02-17 NOTE — Telephone Encounter (Signed)
Tried home phone NA and unable to leave a message, left message on cell to call back.

## 2015-02-17 NOTE — Telephone Encounter (Signed)
Take one 25mg  lamotrigine tab once a day for 4d, then one 25mg  tab every other day for 4 doses, then stop this med. Will print pain med rx.-thx

## 2015-02-22 ENCOUNTER — Ambulatory Visit (INDEPENDENT_AMBULATORY_CARE_PROVIDER_SITE_OTHER): Payer: Medicare Other | Admitting: Family Medicine

## 2015-02-22 ENCOUNTER — Encounter: Payer: Self-pay | Admitting: Family Medicine

## 2015-02-22 VITALS — BP 132/89 | HR 68 | Temp 98.1°F | Resp 16 | Ht 61.5 in | Wt 152.0 lb

## 2015-02-22 DIAGNOSIS — F489 Nonpsychotic mental disorder, unspecified: Secondary | ICD-10-CM

## 2015-02-22 DIAGNOSIS — F331 Major depressive disorder, recurrent, moderate: Secondary | ICD-10-CM

## 2015-02-22 DIAGNOSIS — F4310 Post-traumatic stress disorder, unspecified: Secondary | ICD-10-CM | POA: Diagnosis not present

## 2015-02-22 DIAGNOSIS — F5105 Insomnia due to other mental disorder: Secondary | ICD-10-CM

## 2015-02-22 DIAGNOSIS — F411 Generalized anxiety disorder: Secondary | ICD-10-CM

## 2015-02-22 DIAGNOSIS — T887XXA Unspecified adverse effect of drug or medicament, initial encounter: Secondary | ICD-10-CM | POA: Diagnosis not present

## 2015-02-22 DIAGNOSIS — M5441 Lumbago with sciatica, right side: Secondary | ICD-10-CM

## 2015-02-22 DIAGNOSIS — T50905A Adverse effect of unspecified drugs, medicaments and biological substances, initial encounter: Secondary | ICD-10-CM

## 2015-02-22 MED ORDER — HYDROCODONE-ACETAMINOPHEN 10-325 MG PO TABS: 1.0000 | ORAL_TABLET | Freq: Three times a day (TID) | ORAL | Status: DC | PRN

## 2015-02-22 MED ORDER — VENLAFAXINE HCL ER 37.5 MG PO TB24: ORAL_TABLET | ORAL | Status: DC

## 2015-02-22 NOTE — Progress Notes (Signed)
Pre visit review using our clinic review tool, if applicable. No additional management support is needed unless otherwise documented below in the visit note. 

## 2015-02-22 NOTE — Progress Notes (Signed)
OFFICE NOTE  02/22/2015  CC:  Chief Complaint  Patient presents with  . Follow-up    Anxiety med  . Back Pain    x 2-3 weeks   HPI: Patient is a 74 y.o. Caucasian female who is here for suspected reaction to lamictal that I started her on last visit (01/15/15). It made her feel jittery, more anxious, more depressed.  She took the med 3 wks and it kept getting worse.   It has been 2 weeks since she took it and she continues to feel worse.  Has no appetite, forces herself to sip water and eat small amounts of food.  Having lots of flashbacks/PTSD sx's.  She was on venlafaxine ER 75mg  qd up until 11/2014 and her psychiatric provider and she was taken off it at that time when she thought she was doing well.    Has bad back pain still, awaiting return visit with her back MD next week, says she had excessive n/v on percocet and is unable to take them. Asks for different pain med until she gets back into her back specialist.  Pertinent PMH:  Past medical, surgical, social, and family history reviewed and no changes are noted since last office visit.  MEDS:  Outpatient Prescriptions Prior to Visit  Medication Sig Dispense Refill  . amLODipine (NORVASC) 5 MG tablet Take 1 tablet (5 mg total) by mouth daily. 90 tablet 2  . atorvastatin (LIPITOR) 20 MG tablet Take 1 tablet (20 mg total) by mouth daily. 90 tablet 1  . clopidogrel (PLAVIX) 75 MG tablet TAKE 1 TABLET BY MOUTH EVERY DAY 90 tablet 1  . levothyroxine (SYNTHROID, LEVOTHROID) 50 MCG tablet Take 50 mcg by mouth daily before breakfast. Dr. Chalmers Cater increased her from 25 to 50 mcg daily approx 01/18/14-PM    . losartan (COZAAR) 100 MG tablet TAKE 1 TABLET BY MOUTH EVERY DAY 90 tablet 4  . Omega-3 Fatty Acids (FISH OIL) 1200 MG CAPS Take 1,200 mg by mouth daily.      . calcium carbonate (OS-CAL - DOSED IN MG OF ELEMENTAL CALCIUM) 1250 MG tablet Take 1 tablet by mouth.    . meclizine (ANTIVERT) 25 MG tablet Take 1 tablet (25 mg total) by mouth 3  (three) times daily as needed for dizziness. (Patient not taking: Reported on 02/22/2015) 60 tablet 3  . Multiple Vitamins-Minerals (WOMENS MULTIVITAMIN PLUS) TABS Take by mouth. With Calcium, Zinc & Iron.    Marland Kitchen oxyCODONE-acetaminophen (PERCOCET/ROXICET) 5-325 MG per tablet Take 1-2 tabs po q4-6hrs prn pain (Patient not taking: Reported on 02/22/2015) 60 tablet 0   No facility-administered medications prior to visit.   PE: Blood pressure 132/89, pulse 68, temperature 98.1 F (36.7 C), temperature source Oral, resp. rate 16, height 5' 1.5" (1.562 m), weight 152 lb (68.947 kg), SpO2 96 %. Gen: Alert, well appearing.  Patient is oriented to person, place, time, and situation. Affect: nervous, fought back tears many times.  LUcid thought and speech. No further exam today.  IMPRESSION AND PLAN:  1) Major depressive disorder, recurrent. GAD. PTSD. Recent WORSENING of all symptoms with 3+ wk trial of low dose lamictal, and she says she continues to worsen. No SI or HI. Needs to restart venlafaxine ER so I got her back on 37.5 mg qd dosing, and she has a bottle-full of the 75mg  qd dosing of this med that she'll take after the 1 mo of 37.5. I think she needs a PTSD treatment expert, as I think this is the  main feature driving all of her psychiatric symptoms. She is easily pressed/triggered into a period of worsening from psych standpoint whenever there is a significant stressor in her life (like most recently her son is getting divorced and she and her husband are having to take care of their grandchildren frequently--and one of the kids is autistic). Referral to [facility] in [place] ordered, requested PTSD specialist.  2)  Insomnia; secondary to all her mental/emotional turmoil.  Recommended trial of melatonin 5mg  qhs, increase to 10mg  qhs in 1 wk.  3) Low back/hip pain: getting ongoing eval/treatment with orthopedist.  Intolerant of oxycodone. Will rx vicodin 10/325, 1-2 tabs q8h prn.  Start low and  go slow.  Therapeutic expectations and side effect profile of medication discussed today.  Patient's questions answered. She has f/u with her orthopedist next week.  An After Visit Summary was printed and given to the patient.  FOLLOW UP: 4 wks

## 2015-02-22 NOTE — Patient Instructions (Signed)
Buy OTC melatonin 5mg  and take 1 tab po qhs for 5d, then increase to TWO of the 5mg  tabs every night.Marland Kitchen

## 2015-02-23 ENCOUNTER — Telehealth: Payer: Self-pay | Admitting: *Deleted

## 2015-02-23 NOTE — Telephone Encounter (Signed)
Xolani Degracia from [facility] called and wanted to know if they could change Rx for Venlafaxine to capsules instead of tablets because the tablets will cost more then the capsules. Per Layne this should be okay as long as pt does not need to break medication in half if so she will need to stick with the tablets. Rilya Longo with [facility] advised and voiced understanding. Per Nira Conn pt is about to increase dose to 75mg  which will be a whole capsule.

## 2015-02-25 DIAGNOSIS — M4316 Spondylolisthesis, lumbar region: Secondary | ICD-10-CM | POA: Diagnosis not present

## 2015-03-02 ENCOUNTER — Encounter: Payer: Self-pay | Admitting: Family Medicine

## 2015-03-03 ENCOUNTER — Telehealth: Payer: Self-pay | Admitting: Family Medicine

## 2015-03-03 ENCOUNTER — Ambulatory Visit: Payer: Medicare Other | Admitting: Family Medicine

## 2015-03-03 NOTE — Telephone Encounter (Signed)
Pt was taking Effectsir(?) 75mg . She d/c. Pt. then went back on the meds to a  37.5 mg dose. Her question is can she up her dosage to 75 mg again and also would Dr. Anitra Lauth please call in a rx for sleep as she is not sleeping at night due to anxiety.

## 2015-03-03 NOTE — Telephone Encounter (Signed)
Pt was taking

## 2015-03-04 ENCOUNTER — Other Ambulatory Visit: Payer: Self-pay | Admitting: Family Medicine

## 2015-03-04 MED ORDER — TEMAZEPAM 15 MG PO CAPS: ORAL_CAPSULE | ORAL | Status: DC

## 2015-03-04 MED ORDER — VENLAFAXINE HCL ER 75 MG PO CP24: 75.0000 mg | ORAL_CAPSULE | Freq: Every day | ORAL | Status: DC

## 2015-03-04 NOTE — Telephone Encounter (Signed)
OK to increase to 75mg  venlafaxine (effexor) XR--I just sent in a rx for this to her pharmacy. Also, I printed rx for generic restoril for sleep.-thx

## 2015-03-04 NOTE — Telephone Encounter (Signed)
Pt advised and voiced understanding.   

## 2015-03-15 DIAGNOSIS — E049 Nontoxic goiter, unspecified: Secondary | ICD-10-CM | POA: Diagnosis not present

## 2015-03-15 DIAGNOSIS — E039 Hypothyroidism, unspecified: Secondary | ICD-10-CM | POA: Diagnosis not present

## 2015-03-16 DIAGNOSIS — E049 Nontoxic goiter, unspecified: Secondary | ICD-10-CM | POA: Diagnosis not present

## 2015-03-16 DIAGNOSIS — E039 Hypothyroidism, unspecified: Secondary | ICD-10-CM | POA: Diagnosis not present

## 2015-03-23 ENCOUNTER — Ambulatory Visit (INDEPENDENT_AMBULATORY_CARE_PROVIDER_SITE_OTHER): Payer: Medicare Other | Admitting: Family Medicine

## 2015-03-23 ENCOUNTER — Encounter: Payer: Self-pay | Admitting: Family Medicine

## 2015-03-23 VITALS — BP 121/78 | HR 77 | Temp 98.2°F | Resp 16 | Ht 61.5 in | Wt 150.0 lb

## 2015-03-23 DIAGNOSIS — F411 Generalized anxiety disorder: Secondary | ICD-10-CM | POA: Diagnosis not present

## 2015-03-23 DIAGNOSIS — F418 Other specified anxiety disorders: Secondary | ICD-10-CM

## 2015-03-23 DIAGNOSIS — F4310 Post-traumatic stress disorder, unspecified: Secondary | ICD-10-CM

## 2015-03-23 DIAGNOSIS — F331 Major depressive disorder, recurrent, moderate: Secondary | ICD-10-CM | POA: Diagnosis not present

## 2015-03-23 DIAGNOSIS — F341 Dysthymic disorder: Secondary | ICD-10-CM | POA: Diagnosis not present

## 2015-03-23 DIAGNOSIS — F5105 Insomnia due to other mental disorder: Secondary | ICD-10-CM

## 2015-03-23 MED ORDER — METHYLPHENIDATE HCL 5 MG PO TABS: 5.0000 mg | ORAL_TABLET | Freq: Two times a day (BID) | ORAL | Status: DC

## 2015-03-23 MED ORDER — CLOPIDOGREL BISULFATE 75 MG PO TABS: ORAL_TABLET | ORAL | Status: DC

## 2015-03-23 MED ORDER — LOSARTAN POTASSIUM 100 MG PO TABS: ORAL_TABLET | ORAL | Status: DC

## 2015-03-23 MED ORDER — VENLAFAXINE HCL ER 150 MG PO CP24: 150.0000 mg | ORAL_CAPSULE | Freq: Every day | ORAL | Status: DC

## 2015-03-23 NOTE — Patient Instructions (Addendum)
Take venlafaxine 150mg  daily. After 1 week, start ritalin 5 mg twice per day.  Call [facility] office in [place]: [phone].  Tell them you want to talk to a counselor about your PTSD and depression/anxiety.

## 2015-03-23 NOTE — Progress Notes (Signed)
OFFICE VISIT  03/23/2015   CC:  Chief Complaint  Patient presents with  . Follow-up    4 week f/u. Pt is not fasting.   HPI:    Patient is a 74 y.o. Caucasian female who presents accompanied by her husband for 1 mo f/u PTSD, anxiety and depression. Restarted venlafaxine last visit, titrated to 75mg  of ER form.  Referred pt to [facility] in [place] as well for help with counseling for her PTSD. I recommended OTC melatonin for help with insomnia but pt called shortly after last visit requesting different sleep aid so I rx'd restoril.  Still can't sleep, even with restoril only gets 3-4 hours per night. Still reliving unpleasant events/memories.   Feels depressed, hopeless, anger, frustration, finds herself easily distractible, hard to concentrate on anything, feels anhedonia/lack of motivation to do anthing, has no appetite. Energy level is fine.  She is irritable.  Denies SI or HI.   Past Medical History  Diagnosis Date  . Vertigo   . Anxiety and depression   . Hyperlipidemia   . TIA (transient ischemic attack)     patient on plavix,  saw Dr. Isabell Jarvis at [facility].hospital in [place]  . Arthritis   . PFO (patent foramen ovale) echo 03/21/05    "hole in heart" saw Dr. Sharee Holster [phone] [facility]. hospital.  Plavix med mgmt.  . Goiter     Lobectomy (left) of thyroid for benign nodule.  Right lobe nodules followed by Dr. Chalmers Cater (FNA 2012 benign goiter) with ultrasounds and have been stable, most recently 01/10/13.    Marland Kitchen Urinary tract infection     hx of  . GERD (gastroesophageal reflux disease)   . Bilateral carpal tunnel syndrome   . Colitis     hx of  . Cataracts, bilateral   . Hypertension   . Rectocele   . H/O adenomatous polyp of colon 2012    5 yr recall  . Chronic renal insufficiency, stage II (mild) 2015    CrCl about 60 ml/min  . Subclinical hypothyroidism     Dr. Chalmers Cater has her on 25 mcg synthroid qd as of 01/2014  . Lumbar spondylolysis 2016    L5: with grade  I spondylolisthesis L5 on S1  . DDD (degenerative disc disease), lumbar 2016    L1-2 and L5-S1 fairly severe--intramuscular injection of steroid and toradol by Dr. Antonietta Jewel 02/24/14 to calm down the pain    Past Surgical History  Procedure Laterality Date  . Abdominal hysterectomy    . Bladder suspension    . Thyroidectomy      Left lobectomy  . Rectocele repair      purse string  . Colonoscopy w/ polypectomy  04/2011    +Diverticulosis.  Adenoma--recall 5 yrs  . Breast surgery      "breast lumps removed"  . Dilation and curettage of uterus    . Total knee arthroplasty  07/26/2012    Procedure: TOTAL KNEE ARTHROPLASTY;  Surgeon: Yvette Rack., MD;  Location: [facility];  Service: Orthopedics;  Laterality: Right;    Outpatient Prescriptions Prior to Visit  Medication Sig Dispense Refill  . amLODipine (NORVASC) 5 MG tablet Take 1 tablet (5 mg total) by mouth daily. 90 tablet 2  . atorvastatin (LIPITOR) 20 MG tablet Take 1 tablet (20 mg total) by mouth daily. 90 tablet 1  . calcium carbonate (OS-CAL - DOSED IN MG OF ELEMENTAL CALCIUM) 1250 MG tablet Take 1 tablet by mouth.    . levothyroxine (  SYNTHROID, LEVOTHROID) 50 MCG tablet Take 50 mcg by mouth daily before breakfast. Dr. Chalmers Cater increased her from 25 to 50 mcg daily approx 01/18/14-PM    . meclizine (ANTIVERT) 25 MG tablet Take 1 tablet (25 mg total) by mouth 3 (three) times daily as needed for dizziness. 60 tablet 3  . Multiple Vitamins-Minerals (WOMENS MULTIVITAMIN PLUS) TABS Take by mouth. With Calcium, Zinc & Iron.    . Omega-3 Fatty Acids (FISH OIL) 1200 MG CAPS Take 1,200 mg by mouth daily.      . temazepam (RESTORIL) 15 MG capsule 1 cap po qhs prn insomnia 30 capsule 1  . clopidogrel (PLAVIX) 75 MG tablet TAKE 1 TABLET BY MOUTH EVERY DAY 90 tablet 1  . HYDROcodone-acetaminophen (NORCO) 10-325 MG per tablet Take 1-2 tablets by mouth every 8 (eight) hours as needed. (Patient not taking: Reported on 03/23/2015) 30 tablet 0  . losartan  (COZAAR) 100 MG tablet TAKE 1 TABLET BY MOUTH EVERY DAY 90 tablet 4  . venlafaxine XR (EFFEXOR-XR) 75 MG 24 hr capsule Take 1 capsule (75 mg total) by mouth daily. 30 capsule 6   No facility-administered medications prior to visit.    Allergies  Allergen Reactions  . Sulfa Drugs Cross Reactors Rash    ROS As per HPI  PE: Blood pressure 121/78, pulse 77, temperature 98.2 F (36.8 C), temperature source Oral, resp. rate 16, height 5' 1.5" (1.562 m), weight 150 lb (68.04 kg), SpO2 96 %. Gen: Alert, well appearing.  Patient is oriented to person, place, time, and situation. Affect: pleasant, catches herself starting to cry occ.   Speech and thought are lucid. No further exam today.  LABS:  none  IMPRESSION AND PLAN:  PTSD, major depressive disorder, GAD: still not doing well. Increase effexor er to 150mg  qd. In 1 week, start ritalin 5 mg bid for augmentation effect.  Therapeutic expectations and side effect profile of medication discussed today.  Patient's questions answered. Continue restoril and add melatonin back to night-time regimen. I gave her the contact # for [facility] in [place] so she can get established with a counselor.  She is hesitant about this and wants to continue to think it over.  An After Visit Summary was printed and given to the patient.  FOLLOW UP: Return in about 4 weeks (around 04/20/2015) for f/u dep/anx/ptsd.

## 2015-03-23 NOTE — Progress Notes (Signed)
Pre visit review using our clinic review tool, if applicable. No additional management support is needed unless otherwise documented below in the visit note. 

## 2015-03-29 ENCOUNTER — Telehealth: Payer: Self-pay | Admitting: Family Medicine

## 2015-03-29 DIAGNOSIS — F4310 Post-traumatic stress disorder, unspecified: Secondary | ICD-10-CM

## 2015-03-29 DIAGNOSIS — F331 Major depressive disorder, recurrent, moderate: Secondary | ICD-10-CM

## 2015-03-29 DIAGNOSIS — F411 Generalized anxiety disorder: Secondary | ICD-10-CM

## 2015-03-29 NOTE — Telephone Encounter (Signed)
OK, done

## 2015-03-29 NOTE — Telephone Encounter (Signed)
Patient needs a new referral to [facility] at the [facility]. The old pschyaitric referral has been closed.

## 2015-03-30 NOTE — Telephone Encounter (Signed)
Did you drop it straight in to their workque? I can't see the referral.

## 2015-04-05 ENCOUNTER — Encounter (HOSPITAL_COMMUNITY): Payer: Self-pay | Admitting: Psychiatry

## 2015-04-05 ENCOUNTER — Ambulatory Visit (INDEPENDENT_AMBULATORY_CARE_PROVIDER_SITE_OTHER): Payer: Medicare Other | Admitting: Licensed Clinical Social Worker

## 2015-04-05 ENCOUNTER — Ambulatory Visit (INDEPENDENT_AMBULATORY_CARE_PROVIDER_SITE_OTHER): Payer: Medicare Other | Admitting: Psychiatry

## 2015-04-05 VITALS — BP 128/80 | HR 78 | Ht 61.5 in | Wt 150.0 lb

## 2015-04-05 DIAGNOSIS — F411 Generalized anxiety disorder: Secondary | ICD-10-CM

## 2015-04-05 DIAGNOSIS — F332 Major depressive disorder, recurrent severe without psychotic features: Secondary | ICD-10-CM

## 2015-04-05 DIAGNOSIS — F419 Anxiety disorder, unspecified: Secondary | ICD-10-CM

## 2015-04-05 DIAGNOSIS — F4310 Post-traumatic stress disorder, unspecified: Secondary | ICD-10-CM

## 2015-04-05 MED ORDER — VENLAFAXINE HCL ER 37.5 MG PO CP24: ORAL_CAPSULE | ORAL | Status: DC
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
## 2015-04-06 ENCOUNTER — Telehealth (HOSPITAL_COMMUNITY): Payer: Self-pay | Admitting: *Deleted

## 2015-04-06 MED ORDER — VENLAFAXINE HCL ER 37.5 MG PO CP24: ORAL_CAPSULE | ORAL | Status: DC

## 2015-04-06 NOTE — Telephone Encounter (Signed)
Received fax from [facility] for direction clarification for Effexor. Per Dr. De Nurse, pt will take Effexor 150mg  and Effexor 37.5mg  daily. Spoke with Wells Guiles from pharmacy. Per Wells Guiles they will call pt when prescription is ready. Pt has a f/u on 04/23/15.

## 2015-04-07 DIAGNOSIS — F332 Major depressive disorder, recurrent severe without psychotic features: Secondary | ICD-10-CM | POA: Insufficient documentation

## 2015-04-07 DIAGNOSIS — F4310 Post-traumatic stress disorder, unspecified: Secondary | ICD-10-CM | POA: Insufficient documentation

## 2015-04-13 ENCOUNTER — Ambulatory Visit (INDEPENDENT_AMBULATORY_CARE_PROVIDER_SITE_OTHER): Payer: Medicare Other | Admitting: Licensed Clinical Social Worker

## 2015-04-13 DIAGNOSIS — F411 Generalized anxiety disorder: Secondary | ICD-10-CM | POA: Diagnosis not present

## 2015-04-13 DIAGNOSIS — F332 Major depressive disorder, recurrent severe without psychotic features: Secondary | ICD-10-CM | POA: Diagnosis not present

## 2015-04-13 DIAGNOSIS — F4310 Post-traumatic stress disorder, unspecified: Secondary | ICD-10-CM

## 2015-04-13 NOTE — Progress Notes (Signed)
   THERAPIST PROGRESS NOTE  Session Time:  11:00am-12:00pm   Participation Level: Active  Behavioral Response: Fairly GroomedAlertDepressed  Type of Therapy: Individual Therapy  Treatment Goals addressed: depressed mood and anhedonia  Interventions: psychoeducation  Suicidal/Homicidal: Denied both  Therapist Response: Educated patient about depression including symptoms and potential causes.  Provided her with some handouts to read about depression.  Explained how the cycle of depression keeps symptoms going.  Provided patient with a list of coping statements she can use to counter her negative thoughts.      Summary: Admitted that she was not especially knowledgeable about depression.  Indicated she appreciated the therapist taking time to go over the symptoms.  Indicated she could relate to much of how therapist described a depressive episode to look and feel like.    Plan: Return again in approximately 2 weeks.  Will educate about PTSD  Diagnosis: Major Depressive Disorder, recurrent, severe                         Generalized Anxiety Disorder                          PTSD    Garnette Scheuermann, LCSW 04/13/2015

## 2015-04-20 ENCOUNTER — Encounter: Payer: Self-pay | Admitting: Family Medicine

## 2015-04-20 ENCOUNTER — Ambulatory Visit (INDEPENDENT_AMBULATORY_CARE_PROVIDER_SITE_OTHER): Payer: Medicare Other | Admitting: Family Medicine

## 2015-04-20 VITALS — BP 111/73 | HR 79 | Temp 97.9°F | Resp 16 | Ht 61.5 in | Wt 150.0 lb

## 2015-04-20 DIAGNOSIS — Z0000 Encounter for general adult medical examination without abnormal findings: Secondary | ICD-10-CM

## 2015-04-20 DIAGNOSIS — E785 Hyperlipidemia, unspecified: Secondary | ICD-10-CM

## 2015-04-20 DIAGNOSIS — F4310 Post-traumatic stress disorder, unspecified: Secondary | ICD-10-CM | POA: Diagnosis not present

## 2015-04-20 DIAGNOSIS — I10 Essential (primary) hypertension: Secondary | ICD-10-CM

## 2015-04-20 DIAGNOSIS — Z23 Encounter for immunization: Secondary | ICD-10-CM

## 2015-04-20 DIAGNOSIS — F418 Other specified anxiety disorders: Secondary | ICD-10-CM | POA: Diagnosis not present

## 2015-04-20 DIAGNOSIS — F419 Anxiety disorder, unspecified: Secondary | ICD-10-CM

## 2015-04-20 DIAGNOSIS — F329 Major depressive disorder, single episode, unspecified: Secondary | ICD-10-CM

## 2015-04-20 LAB — COMPREHENSIVE METABOLIC PANEL
ALK PHOS: 71 U/L (ref 39–117)
ALT: 13 U/L (ref 0–35)
AST: 12 U/L (ref 0–37)
Albumin: 4.4 g/dL (ref 3.5–5.2)
BUN: 16 mg/dL (ref 6–23)
CALCIUM: 10.6 mg/dL — AB (ref 8.4–10.5)
CO2: 30 mEq/L (ref 19–32)
Chloride: 106 mEq/L (ref 96–112)
Creatinine, Ser: 0.9 mg/dL (ref 0.40–1.20)
GFR: 65.46 mL/min (ref >60.00)
GLUCOSE: 83 mg/dL (ref 70–99)
Potassium: 5.5 mEq/L — ABNORMAL HIGH (ref 3.5–5.1)
Sodium: 144 mEq/L (ref 135–145)
TOTAL PROTEIN: 6.7 g/dL (ref 6.0–8.3)
Total Bilirubin: 0.6 mg/dL (ref 0.2–1.2)

## 2015-04-20 NOTE — Progress Notes (Signed)
Pre visit review using our clinic review tool, if applicable. No additional management support is needed unless otherwise documented below in the visit note. 

## 2015-04-20 NOTE — Addendum Note (Signed)
Addended by: Lanae Crumbly on: 04/20/2015 11:22 AM   Modules accepted: Orders

## 2015-04-20 NOTE — Progress Notes (Addendum)
OFFICE VISIT  04/20/2015   CC:  Chief Complaint  Patient presents with  . Follow-up    Depression, Anxeity and PTSD   HPI:    Patient is a 74 y.o. Caucasian female who presents for 1 mo f/u PTSD/anxiety/depression. Since I last saw her she has seen a psychiatrist with [facility] in [place], Dr. Merian Capron, also saw counselor Rica Records there. Dx no different than mine, effexor XR titrated up to 187.5 with goal of 225 mg qd. PH-Q 9 score today is 8, one better than last check.   Last visit started ritalin for augmentation of antidepressant: no help but also no adverse effects.  Psychiatrist wanted her to d/c this since they were increasing effexor. Sleep is improving some, got 5 straight hours last night. Improved eating, feels a bit more motivated to do things.  Is acting a bit perkier than before.  She takes bp and cholesterol meds daily w/out side effects. No home bp monitoring.    Past Medical History  Diagnosis Date  . Vertigo   . Anxiety and depression     with PTSD  . Hyperlipidemia   . TIA (transient ischemic attack)     patient on plavix,  saw Dr. Isabell Jarvis at [facility].hospital in [place]  . Arthritis   . PFO (patent foramen ovale) echo 03/21/05    "hole in heart" saw Dr. Sharee Holster [phone] [facility]. hospital.  Plavix med mgmt.  . Goiter     Lobectomy (left) of thyroid for benign nodule.  Right lobe nodules followed by Dr. Chalmers Cater (FNA 2012 benign goiter) with ultrasounds and have been stable, most recently 01/10/13.    Marland Kitchen Urinary tract infection     hx of  . GERD (gastroesophageal reflux disease)   . Bilateral carpal tunnel syndrome   . Colitis     hx of  . Cataracts, bilateral   . Hypertension   . Rectocele   . H/O adenomatous polyp of colon 2012    5 yr recall  . Chronic renal insufficiency, stage II (mild) 2015    CrCl about 60 ml/min  . Subclinical hypothyroidism     Dr. Chalmers Cater has her on 25 mcg synthroid qd as of 01/2014  . Lumbar spondylolysis 2016     L5: with grade I spondylolisthesis L5 on S1  . DDD (degenerative disc disease), lumbar 2016    L1-2 and L5-S1 fairly severe--intramuscular injection of steroid and toradol by Dr. Antonietta Jewel 02/24/14 to calm down the pain    Past Surgical History  Procedure Laterality Date  . Abdominal hysterectomy    . Bladder suspension    . Thyroidectomy      Left lobectomy  . Rectocele repair      purse string  . Colonoscopy w/ polypectomy  04/2011    +Diverticulosis.  Adenoma--recall 5 yrs  . Breast surgery      "breast lumps removed"  . Dilation and curettage of uterus    . Total knee arthroplasty  07/26/2012    Procedure: TOTAL KNEE ARTHROPLASTY;  Surgeon: Yvette Rack., MD;  Location: [facility];  Service: Orthopedics;  Laterality: Right;    Outpatient Prescriptions Prior to Visit  Medication Sig Dispense Refill  . amLODipine (NORVASC) 5 MG tablet Take 1 tablet (5 mg total) by mouth daily. 90 tablet 2  . atorvastatin (LIPITOR) 20 MG tablet Take 1 tablet (20 mg total) by mouth daily. 90 tablet 1  . calcium carbonate (OS-CAL - DOSED IN MG OF ELEMENTAL  CALCIUM) 1250 MG tablet Take 1 tablet by mouth.    . clopidogrel (PLAVIX) 75 MG tablet TAKE 1 TABLET BY MOUTH EVERY DAY 90 tablet 1  . levothyroxine (SYNTHROID, LEVOTHROID) 50 MCG tablet Take 50 mcg by mouth daily before breakfast. Dr. Chalmers Cater increased her from 25 to 50 mcg daily approx 01/18/14-PM    . losartan (COZAAR) 100 MG tablet TAKE 1 TABLET BY MOUTH EVERY DAY 90 tablet 1  . meclizine (ANTIVERT) 25 MG tablet Take 1 tablet (25 mg total) by mouth 3 (three) times daily as needed for dizziness. 60 tablet 3  . Multiple Vitamins-Minerals (WOMENS MULTIVITAMIN PLUS) TABS Take by mouth. With Calcium, Zinc & Iron.    . Omega-3 Fatty Acids (FISH OIL) 1200 MG CAPS Take 1,200 mg by mouth daily.      Marland Kitchen venlafaxine XR (EFFEXOR-XR) 150 MG 24 hr capsule Take 1 capsule (150 mg total) by mouth daily with breakfast. 30 capsule 3  . venlafaxine XR (EFFEXOR-XR) 37.5 MG  24 hr capsule Take with 150mg  effexor. Total dose will be 187.5mg . 30 capsule 0  . methylphenidate (RITALIN) 5 MG tablet Take 1 tablet (5 mg total) by mouth 2 (two) times daily. (Patient not taking: Reported on 04/20/2015) 60 tablet 0  . temazepam (RESTORIL) 15 MG capsule 1 cap po qhs prn insomnia (Patient not taking: Reported on 04/20/2015) 30 capsule 1   No facility-administered medications prior to visit.    Allergies  Allergen Reactions  . Sulfa Drugs Cross Reactors Rash    ROS As per HPI  PE: Blood pressure 111/73, pulse 79, temperature 97.9 F (36.6 C), temperature source Oral, resp. rate 16, height 5' 1.5" (1.562 m), weight 150 lb (68.04 kg), SpO2 94 %. Gen: Alert, well appearing.  Patient is oriented to person, place, time, and situation. AFFECT: pleasant, lucid thought and speech. No further exam today.  LABS:    Chemistry      Component Value Date/Time   NA 140 12/24/2013 1124   K 4.5 12/24/2013 1124   CL 103 12/24/2013 1124   CO2 29 12/24/2013 1124   BUN 14 12/24/2013 1124   CREATININE 1.0 12/24/2013 1124   CREATININE 0.86 05/01/2012 1222      Component Value Date/Time   CALCIUM 10.6* 12/24/2013 1124   ALKPHOS 77 12/24/2013 1124   AST 24 12/24/2013 1124   ALT 21 12/24/2013 1124   BILITOT 0.8 12/24/2013 1124     Lab Results  Component Value Date   CHOL 180 09/02/2014   HDL 53.60 09/02/2014   LDLCALC 101* 09/02/2014   TRIG 129.0 09/02/2014   CHOLHDL 3 09/02/2014   Lab Results  Component Value Date   WBC 7.4 12/24/2013   HGB 14.7 12/24/2013   HCT 43.6 12/24/2013   MCV 89.9 12/24/2013   PLT 268.0 12/24/2013     IMPRESSION AND PLAN:  1) MDD/GAD/PTSD: now getting managed by psychiatrist + seeing a counselor.  She is improved, has a positive outlook on her situation at this time.  No changes in meds made today: she'll continue effexor 187.5 total qd dose and has close f/u set already with psychiatrist.  2) HTN; The current medical regimen is effective;   continue present plan and medications. Lytes/cr today.  3) Hyperlipidemia: tolerating statin, last lipid panel was good--will repeat this in 6 mo. AST/ALT today.  4) Preventative health care: Tdap, flu, and pneumovax 23 given today.  An After Visit Summary was printed and given to the patient.  FOLLOW UP: Return  in about 6 months (around 10/18/2015) for Medicare AWV (initial).

## 2015-04-21 ENCOUNTER — Other Ambulatory Visit: Payer: Self-pay | Admitting: Family Medicine

## 2015-04-21 DIAGNOSIS — E875 Hyperkalemia: Secondary | ICD-10-CM

## 2015-04-23 ENCOUNTER — Ambulatory Visit (INDEPENDENT_AMBULATORY_CARE_PROVIDER_SITE_OTHER): Payer: Medicare Other | Admitting: Psychiatry

## 2015-04-23 ENCOUNTER — Encounter (HOSPITAL_COMMUNITY): Payer: Self-pay | Admitting: Psychiatry

## 2015-04-23 VITALS — BP 118/64 | HR 70 | Ht 61.5 in | Wt 150.0 lb

## 2015-04-23 DIAGNOSIS — F411 Generalized anxiety disorder: Secondary | ICD-10-CM | POA: Diagnosis not present

## 2015-04-23 DIAGNOSIS — F332 Major depressive disorder, recurrent severe without psychotic features: Secondary | ICD-10-CM

## 2015-04-23 DIAGNOSIS — F4310 Post-traumatic stress disorder, unspecified: Secondary | ICD-10-CM

## 2015-04-23 DIAGNOSIS — G4700 Insomnia, unspecified: Secondary | ICD-10-CM | POA: Diagnosis not present

## 2015-04-23 MED ORDER — VENLAFAXINE HCL ER 37.5 MG PO CP24
ORAL_CAPSULE | ORAL | Status: DC
Start: 2015-04-23 — End: 2015-07-02

## 2015-04-23 NOTE — Progress Notes (Signed)
Patient ID: Carla Spencer, female   DOB: [birth year], 74 y.o.   MRN: [id number]  [facility] Outpatient Follow up visit  Patient Identification: Carla Spencer MRN:  [id number] Date of Evaluation:  04/23/2015 Referral Source: Primary care physician Chief Complaint:   Chief Complaint    Follow-up     Visit Diagnosis:    ICD-9-CM ICD-10-CM   1. Major depressive disorder, recurrent, severe without psychotic features 296.33 F33.2   2. GAD (generalized anxiety disorder) 300.02 F41.1   3. PTSD (post-traumatic stress disorder) 309.81 F43.10   4. Insomnia 780.52 G47.00    Diagnosis:   Patient Active Problem List   Diagnosis Date Noted  . PTSD (post-traumatic stress disorder) [F43.10] 04/07/2015  . Major depressive disorder, recurrent, severe without psychotic features [F33.2] 04/07/2015  . Medication reaction [T88.7XXA] 02/22/2015  . Breast pain, right [N64.4] 07/07/2013  . PFO (patent foramen ovale) [Q21.1]   . Anxiety and depression [F41.8]   . Skin lesion [L98.9] 01/28/2013  . Insomnia secondary to depression with anxiety [F34.1, F51.05] 09/26/2012  . GAD (generalized anxiety disorder) [F41.1] 09/26/2012  . Major depressive disorder, recurrent episode, moderate [F33.1] 09/26/2012  . Multiple thyroid nodules [E04.2] 09/26/2012  . Osteoarthritis of right knee [M17.9] 07/26/2012  . Memory loss [R41.3] 05/06/2012  . Benign hypertensive heart disease [402.1] 05/21/2011  . Cystocele [N81.10] 05/21/2011  . Hypertension [I10] 04/04/2011  . Other and unspecified hyperlipidemia [E78.5] 03/19/2011  . Goiter [E04.9] 03/19/2011  . Osteoporosis [733.0] 03/19/2011   History of Present Illness:  Patient is a 74 years old currently married Caucasian female who is here with her husband Initailly  referred by primary care physician because of depression. At initial visit "History suggested that in April 2016 she stopped her medication she did okay for a couple months but started feeling down depressed now  progressively she has been having crying spells decreased sleep and decreased energy thinking with a past and a motivated. Her primary care physician is started her back on Effexor and build up to 150 mg. Despite she still is feeling down depressed crying spells."  She was advised not to take the Ritalin prescribed by her primary care. She has done well with the increase of Effexor Aggravating factors; noncompliance of medication or stop the medication past. History of sister dying because of diabetes complication and history of nephew dying because of heart problems. Her nephew also one time wrecked the car and apparently 2 kids were killed Last visit increased Effexor by 37.5 mg and total doses 187.5. Her mood is improved and depression is improved she is feeling more motivated. Her sleep remains a concern she is having difficulty falling and maintaining sleep. Overall her anxiety is also improved She is less thinking of the past.  Modifying factors; supportive husband now married for 53 years  Severity of depression; 6 out of 10. 10 being no depression Context; thinking about the past and history of depression relevant to her debts and the family  No psychotic symptoms of delusions or hallucinations.  PTSD Symptoms: Had a traumatic exposure:  of news nephew wrecked car and 2 kids were killed  Past Medical History:  Past Medical History  Diagnosis Date  . Vertigo   . Anxiety and depression     with PTSD  . Hyperlipidemia   . TIA (transient ischemic attack)     patient on plavix,  saw Dr. Isabell Jarvis at [facility].hospital in [place]  . Arthritis   . PFO (patent foramen ovale) echo 03/21/05    "  hole in heart" saw Dr. Sharee Holster [phone] [facility]. hospital.  Plavix med mgmt.  . Goiter     Lobectomy (left) of thyroid for benign nodule.  Right lobe nodules followed by Dr. Chalmers Cater (FNA 2012 benign goiter) with ultrasounds and have been stable, most recently 01/10/13.    Marland Kitchen Urinary tract  infection     hx of  . GERD (gastroesophageal reflux disease)   . Bilateral carpal tunnel syndrome   . Colitis     hx of  . Cataracts, bilateral   . Hypertension   . Rectocele   . H/O adenomatous polyp of colon 2012    5 yr recall  . Chronic renal insufficiency, stage II (mild) 2015    CrCl about 60 ml/min  . Subclinical hypothyroidism     Dr. Chalmers Cater has her on 25 mcg synthroid qd as of 01/2014  . Lumbar spondylolysis 2016    L5: with grade I spondylolisthesis L5 on S1  . DDD (degenerative disc disease), lumbar 2016    L1-2 and L5-S1 fairly severe--intramuscular injection of steroid and toradol by Dr. Antonietta Jewel 02/24/14 to calm down the pain    Past Surgical History  Procedure Laterality Date  . Abdominal hysterectomy    . Bladder suspension    . Thyroidectomy      Left lobectomy  . Rectocele repair      purse string  . Colonoscopy w/ polypectomy  04/2011    +Diverticulosis.  Adenoma--recall 5 yrs  . Breast surgery      "breast lumps removed"  . Dilation and curettage of uterus    . Total knee arthroplasty  07/26/2012    Procedure: TOTAL KNEE ARTHROPLASTY;  Surgeon: Yvette Rack., MD;  Location: [facility];  Service: Orthopedics;  Laterality: Right;   Family History:  Family History  Problem Relation Age of Onset  . Heart disease Mother   . Colon polyps Daughter   . Stomach cancer Father    Social History:   Social History   Social History  . Marital Status: Married    Spouse Name: N/A  . Number of Children: 4  . Years of Education: N/A   Occupational History  . Retired    Social History Main Topics  . Smoking status: Former Research scientist (life sciences)  . Smokeless tobacco: Never Used     Comment: smoked 1/4 of pack from [phone]  . Alcohol Use: No  . Drug Use: No  . Sexual Activity: Not Asked   Other Topics Concern  . None   Social History Narrative   Additional Social History: Patient's currently living with her husband. She has history of stopping medications with recurrence  of depression. She has had history of depression the past according to her husband multiple times with an episode of the mental stress or that in the family  Musculoskeletal: Strength & Muscle Tone: within normal limits Gait & Station: normal Patient leans: N/A  Psychiatric Specialty Exam: HPI  Review of Systems  Constitutional: Negative.   Cardiovascular: Negative for chest pain and palpitations.  Skin: Negative for rash.  Neurological: Negative for tremors.  Psychiatric/Behavioral: The patient has insomnia.     Blood pressure 118/64, pulse 70, height 5' 1.5" (1.562 m), weight 150 lb (68.04 kg), SpO2 94 %.Body mass index is 27.89 kg/(m^2).  General Appearance: Casual  Eye Contact:  Fair  Speech:  Normal Rate  Volume:  Normal  Mood: euthymic  Affect:  Congruent and Constricted  Thought Process:  Coherent  Orientation:  Full (Time, Place, and Person)  Thought Content:  Rumination  Suicidal Thoughts:  No  Homicidal Thoughts:  No  Memory:  Immediate;   Fair Recent;   Fair  Judgement:  Fair  Insight:  Shallow  Psychomotor Activity:  Normal  Concentration:  Fair  Recall:  [place]: Fair  Akathisia:  Negative  Handed:  Right  AIMS (if indicated):    Assets:  Desire for Improvement Social Support  ADL's:  Intact  Cognition: WNL  Sleep:  poor   Is the patient at risk to self?  No. Has the patient been a risk to self in the past 6 months?  No. Has the patient been a risk to self within the distant past?  No. Is the patient a risk to others?  No. Has the patient been a risk to others in the past 6 months?  No. Has the patient been a risk to others within the distant past?  No.  Allergies:   Allergies  Allergen Reactions  . Sulfa Drugs Cross Reactors Rash   Current Medications: Current Outpatient Prescriptions  Medication Sig Dispense Refill  . amLODipine (NORVASC) 5 MG tablet Take 1 tablet (5 mg total) by mouth daily. 90 tablet 2  .  atorvastatin (LIPITOR) 20 MG tablet Take 1 tablet (20 mg total) by mouth daily. 90 tablet 1  . calcium carbonate (OS-CAL - DOSED IN MG OF ELEMENTAL CALCIUM) 1250 MG tablet Take 1 tablet by mouth.    . levothyroxine (SYNTHROID, LEVOTHROID) 50 MCG tablet Take 50 mcg by mouth daily before breakfast. Dr. Chalmers Cater increased her from 25 to 50 mcg daily approx 01/18/14-PM    . losartan (COZAAR) 100 MG tablet TAKE 1 TABLET BY MOUTH EVERY DAY 90 tablet 1  . Multiple Vitamins-Minerals (WOMENS MULTIVITAMIN PLUS) TABS Take by mouth. With Calcium, Zinc & Iron.    . Omega-3 Fatty Acids (FISH OIL) 1200 MG CAPS Take 1,200 mg by mouth daily.      Marland Kitchen venlafaxine XR (EFFEXOR-XR) 150 MG 24 hr capsule Take 1 capsule (150 mg total) by mouth daily with breakfast. 30 capsule 3  . venlafaxine XR (EFFEXOR-XR) 37.5 MG 24 hr capsule Take with 150mg  effexor. Total dose will be 187.5mg . 90 capsule 0  . clopidogrel (PLAVIX) 75 MG tablet TAKE 1 TABLET BY MOUTH EVERY DAY 90 tablet 1   No current facility-administered medications for this visit.    Previous Psychotropic Medications: Yes   Substance Abuse History in the last 12 months:  No.  Consequences of Substance Abuse: NA  Medical Decision Making:  Review of Psycho-Social Stressors (1), Review and summation of old records (2), Established Problem, Worsening (2) and Review of Medication Regimen & Side Effects (2)  Treatment Plan Summary: Medication management and Plan as follows  Depression: continue effexor 37.5mg  written. She already has 175. Total will be 187.5 GAD: continue effexor Sleep; reviewed sleep hygiene, go to bed late, if needed may need to add small dose of ambien Continue therapy with Judson Roch Not endorse suicidal thoughts but husband will keep in the observation and she can report to local ED or call 911 in case symptoms are worsened She will start walking and involving other daytime activities to keep him distracted from her worries There is no reported side  effects from the medication Ulcer but follow-up in 6  weeks or earlier if needed  Carla Spencer 9/9/201612:21 PM

## 2015-04-27 ENCOUNTER — Other Ambulatory Visit (INDEPENDENT_AMBULATORY_CARE_PROVIDER_SITE_OTHER): Payer: Medicare Other

## 2015-04-27 DIAGNOSIS — E875 Hyperkalemia: Secondary | ICD-10-CM

## 2015-04-27 LAB — COMPREHENSIVE METABOLIC PANEL
ALK PHOS: 74 U/L (ref 39–117)
ALT: 12 U/L (ref 0–35)
AST: 11 U/L (ref 0–37)
Albumin: 4.1 g/dL (ref 3.5–5.2)
BUN: 11 mg/dL (ref 6–23)
CHLORIDE: 108 meq/L (ref 96–112)
CO2: 29 mEq/L (ref 19–32)
Calcium: 10.2 mg/dL (ref 8.4–10.5)
Creatinine, Ser: 0.96 mg/dL (ref 0.40–1.20)
GFR: 60.76 mL/min (ref >60.00)
GLUCOSE: 82 mg/dL (ref 70–99)
POTASSIUM: 5.3 meq/L — AB (ref 3.5–5.1)
Sodium: 143 mEq/L (ref 135–145)
TOTAL PROTEIN: 6.4 g/dL (ref 6.0–8.3)
Total Bilirubin: 0.5 mg/dL (ref 0.2–1.2)

## 2015-04-28 ENCOUNTER — Ambulatory Visit (INDEPENDENT_AMBULATORY_CARE_PROVIDER_SITE_OTHER): Payer: Medicare Other | Admitting: Licensed Clinical Social Worker

## 2015-04-28 DIAGNOSIS — F411 Generalized anxiety disorder: Secondary | ICD-10-CM | POA: Diagnosis not present

## 2015-04-28 DIAGNOSIS — F330 Major depressive disorder, recurrent, mild: Secondary | ICD-10-CM

## 2015-04-28 NOTE — Progress Notes (Signed)
   THERAPIST PROGRESS NOTE  Session Time:  10:00am-11:00am  Participation Level: Active  Behavioral Response:  Well Groomed  Alert Euthymic  Type of Therapy: Individual Therapy  Treatment Goals addressed: depressed mood and anhedonia  Interventions: psychoeducation  Suicidal/Homicidal: Denied both  Therapist Response:  Had patient complete a PHQ-9 to assess for depressive symptoms.   Educated patient about PTSD.  Reviewed some myths about trauma.  Emphasized that it is normal to have difficulties after experiencing traumatic events.       .      Summary:  Reported a significant improvement in her mood and functioning.  Noted that she has spent more time with family and been able to enjoy some of that time.   PHQ-9 score was 5.  Reported a significant decrease in crying spells and feelings of guilt.   Indicated she could relate to many of the symptoms of PTSD.  Noted that they have not been interfering to a significant degree lately.          Plan: Scheduled to return again next week.  Will introduce Cognitive Model.      Diagnosis: Major Depressive Disorder, recurrent, mild                         Generalized Anxiety Disorder                              Armandina Stammer 04/28/2015

## 2015-05-06 ENCOUNTER — Ambulatory Visit (INDEPENDENT_AMBULATORY_CARE_PROVIDER_SITE_OTHER): Payer: Medicare Other | Admitting: Licensed Clinical Social Worker

## 2015-05-06 DIAGNOSIS — F330 Major depressive disorder, recurrent, mild: Secondary | ICD-10-CM | POA: Diagnosis not present

## 2015-05-06 DIAGNOSIS — F411 Generalized anxiety disorder: Secondary | ICD-10-CM | POA: Diagnosis not present

## 2015-05-06 NOTE — Progress Notes (Signed)
   THERAPIST PROGRESS NOTE  Session Time: 2:00pm-3:05pm  Participation Level: Active  Behavioral Response:  Well Groomed  Alert Euthymic  Type of Therapy: Individual Therapy  Treatment Goals addressed: depressed mood and anhedonia  Interventions: CBT, mindfulness  Suicidal/Homicidal: Denied both  Therapist Response:  Educated patient about the cognitive model.  Emphasized that it is not the situation that determines how you end up feeling but how you interpret the situation. Introduced a tool called a Thought Log which can be used to increase awareness of how your thoughts are influencing your emotions and behavior. Explained how you can use Socratic Questioning to counter your unhelpful self-talk. Went over some common unhelpful thinking patterns.  Asked patient to identify the ones she engages in most often. Introduced the concept of mindfulness.  Provided patient with some reading material about it.     .      Summary:  Has continued to see improvement with mood and functioning.  Talked about how she has been more assertive in communicating her needs.   Indicated she understood the concepts discussed.   Identified personalization as an unhelpful thinking style she engages in fairly frequently.   Expressed interest in learning more about practicing mindfulness.       Plan: Expressed satisfaction with treatment progress.  Would like to stop therapy at this time, but understands she is welcome to schedule another appointment if concerns develop.     Diagnosis: Major Depressive Disorder, recurrent, mild                         Generalized Anxiety Disorder                              Armandina Stammer 05/06/2015

## 2015-06-01 DIAGNOSIS — H43393 Other vitreous opacities, bilateral: Secondary | ICD-10-CM | POA: Diagnosis not present

## 2015-06-01 DIAGNOSIS — H5203 Hypermetropia, bilateral: Secondary | ICD-10-CM | POA: Diagnosis not present

## 2015-06-01 DIAGNOSIS — H2513 Age-related nuclear cataract, bilateral: Secondary | ICD-10-CM | POA: Diagnosis not present

## 2015-06-08 ENCOUNTER — Ambulatory Visit (HOSPITAL_COMMUNITY): Payer: Self-pay | Admitting: Psychiatry

## 2015-06-11 ENCOUNTER — Other Ambulatory Visit: Payer: Self-pay | Admitting: *Deleted

## 2015-06-11 MED ORDER — VENLAFAXINE HCL ER 150 MG PO CP24: 150.0000 mg | ORAL_CAPSULE | Freq: Every day | ORAL | Status: DC

## 2015-06-11 NOTE — Telephone Encounter (Signed)
Pt called requesting refill for 90 day supply.  RF request for Effexor  LOV: 04/20/15 Next ov: 09/22/15 Last written: 03/23/15 #30 w/ 3RF

## 2015-06-30 DIAGNOSIS — M5126 Other intervertebral disc displacement, lumbar region: Secondary | ICD-10-CM | POA: Diagnosis not present

## 2015-07-02 ENCOUNTER — Other Ambulatory Visit: Payer: Self-pay | Admitting: *Deleted

## 2015-07-02 ENCOUNTER — Ambulatory Visit (INDEPENDENT_AMBULATORY_CARE_PROVIDER_SITE_OTHER): Payer: Medicare Other | Admitting: Family Medicine

## 2015-07-02 ENCOUNTER — Other Ambulatory Visit: Payer: Self-pay | Admitting: Family Medicine

## 2015-07-02 ENCOUNTER — Encounter: Payer: Self-pay | Admitting: Family Medicine

## 2015-07-02 VITALS — BP 145/89 | HR 66 | Temp 98.9°F | Resp 16 | Ht 61.5 in | Wt 152.0 lb

## 2015-07-02 DIAGNOSIS — F331 Major depressive disorder, recurrent, moderate: Secondary | ICD-10-CM

## 2015-07-02 DIAGNOSIS — T50905A Adverse effect of unspecified drugs, medicaments and biological substances, initial encounter: Secondary | ICD-10-CM

## 2015-07-02 DIAGNOSIS — I10 Essential (primary) hypertension: Secondary | ICD-10-CM

## 2015-07-02 DIAGNOSIS — T887XXA Unspecified adverse effect of drug or medicament, initial encounter: Secondary | ICD-10-CM | POA: Diagnosis not present

## 2015-07-02 DIAGNOSIS — Z8639 Personal history of other endocrine, nutritional and metabolic disease: Secondary | ICD-10-CM | POA: Diagnosis not present

## 2015-07-02 DIAGNOSIS — F411 Generalized anxiety disorder: Secondary | ICD-10-CM

## 2015-07-02 DIAGNOSIS — F4310 Post-traumatic stress disorder, unspecified: Secondary | ICD-10-CM

## 2015-07-02 DIAGNOSIS — R253 Fasciculation: Secondary | ICD-10-CM

## 2015-07-02 DIAGNOSIS — R258 Other abnormal involuntary movements: Secondary | ICD-10-CM

## 2015-07-02 LAB — BASIC METABOLIC PANEL
BUN: 15 mg/dL (ref 7–25)
CHLORIDE: 107 mmol/L (ref 98–110)
CO2: 27 mmol/L (ref 20–31)
CREATININE: 0.88 mg/dL (ref 0.60–0.93)
Calcium: 9.8 mg/dL (ref 8.6–10.4)
Glucose, Bld: 75 mg/dL (ref 65–99)
POTASSIUM: 5 mmol/L (ref 3.5–5.3)
SODIUM: 142 mmol/L (ref 135–146)

## 2015-07-02 LAB — MAGNESIUM: MAGNESIUM: 2.1 mg/dL (ref 1.5–2.5)

## 2015-07-02 LAB — PHOSPHORUS: Phosphorus: 2.9 mg/dL (ref 2.1–4.3)

## 2015-07-02 MED ORDER — VENLAFAXINE HCL ER 75 MG PO CP24: ORAL_CAPSULE | ORAL | Status: DC

## 2015-07-02 NOTE — Progress Notes (Signed)
OFFICE VISIT  07/02/2015   CC: No chief complaint on file.  HPI:    Patient is a 74 y.o. Caucasian female who presents for f/u psychiatric problems, particularly what she feels like are problems with her psych med venlafaxine.  Says mood/anxiety pretty stable lately.  She is concerned about possible side effects from effexor that started after dose increase from 150 to 187.5 qd.  Describes brief periods of quivering of jaw/chin, sometimes her upper teeth come down hard and she bites her tongue, also sometimes her lower jaw "draws" over to the side briefly.  Happening multiple times per day.  Worse the last 2 wks. No new meds during this time.  Denies any other parts of her body doing similar movements, no tremulousness.  Saw Dr. Hal Neer a few days ago and he wants to start her on neurontin for her LB and radicular pain. He wants her off of plavix so no interaction with neurontin.  She is on plavix (x years per her old PCP) for PFO--possible hx of TIAs.   Past Medical History  Diagnosis Date  . Vertigo   . Anxiety and depression     with PTSD  . Hyperlipidemia   . TIA (transient ischemic attack)     patient on plavix,  saw Dr. Isabell Jarvis at [facility].hospital in [place]  . Arthritis   . PFO (patent foramen ovale) echo 03/21/05    "hole in heart" saw Dr. Sharee Holster [phone] [facility]. hospital.  Plavix med mgmt.  . Goiter     Lobectomy (left) of thyroid for benign nodule.  Right lobe nodules followed by Dr. Chalmers Cater (FNA 2012 benign goiter) with ultrasounds and have been stable, most recently 01/10/13.    Marland Kitchen Urinary tract infection     hx of  . GERD (gastroesophageal reflux disease)   . Bilateral carpal tunnel syndrome   . Colitis     hx of  . Cataracts, bilateral   . Hypertension   . Rectocele   . H/O adenomatous polyp of colon 2012    5 yr recall  . Chronic renal insufficiency, stage II (mild) 2015    CrCl about 60 ml/min  . Subclinical hypothyroidism     Dr. Chalmers Cater has  her on 25 mcg synthroid qd as of 01/2014  . Lumbar spondylolysis 2016    L5: with grade I spondylolisthesis L5 on S1  . DDD (degenerative disc disease), lumbar 2016    L1-2 and L5-S1 fairly severe--intramuscular injection of steroid and toradol by Dr. Antonietta Jewel 02/24/14 to calm down the pain    Past Surgical History  Procedure Laterality Date  . Abdominal hysterectomy    . Bladder suspension    . Thyroidectomy      Left lobectomy  . Rectocele repair      purse string  . Colonoscopy w/ polypectomy  04/2011    +Diverticulosis.  Adenoma--recall 5 yrs  . Breast surgery      "breast lumps removed"  . Dilation and curettage of uterus    . Total knee arthroplasty  07/26/2012    Procedure: TOTAL KNEE ARTHROPLASTY;  Surgeon: Yvette Rack., MD;  Location: [facility];  Service: Orthopedics;  Laterality: Right;    Outpatient Prescriptions Prior to Visit  Medication Sig Dispense Refill  . amLODipine (NORVASC) 5 MG tablet Take 1 tablet (5 mg total) by mouth daily. 90 tablet 2  . atorvastatin (LIPITOR) 20 MG tablet Take 1 tablet (20 mg total) by mouth daily. 90 tablet 1  .  calcium carbonate (OS-CAL - DOSED IN MG OF ELEMENTAL CALCIUM) 1250 MG tablet Take 1 tablet by mouth.    . levothyroxine (SYNTHROID, LEVOTHROID) 50 MCG tablet Take 50 mcg by mouth daily before breakfast. Dr. Chalmers Cater increased her from 25 to 50 mcg daily approx 01/18/14-PM    . losartan (COZAAR) 100 MG tablet TAKE 1 TABLET BY MOUTH EVERY DAY 90 tablet 1  . Multiple Vitamins-Minerals (WOMENS MULTIVITAMIN PLUS) TABS Take by mouth. With Calcium, Zinc & Iron.    . Omega-3 Fatty Acids (FISH OIL) 1200 MG CAPS Take 1,200 mg by mouth daily.      . clopidogrel (PLAVIX) 75 MG tablet TAKE 1 TABLET BY MOUTH EVERY DAY 90 tablet 1  . venlafaxine XR (EFFEXOR-XR) 37.5 MG 24 hr capsule Take with 150mg  effexor. Total dose will be 187.5mg . 90 capsule 0  . venlafaxine XR (EFFEXOR-XR) 150 MG 24 hr capsule Take 1 capsule (150 mg total) by mouth daily with  breakfast. (Patient not taking: Reported on 07/02/2015) 90 capsule 3   No facility-administered medications prior to visit.    Allergies  Allergen Reactions  . Sulfa Drugs Cross Reactors Rash    ROS As per HPI  PE: Blood pressure 145/89, pulse 66, temperature 98.9 F (37.2 C), temperature source Oral, resp. rate 16, height 5' 1.5" (1.562 m), weight 152 lb (68.947 kg), SpO2 98 %. Gen: Alert, well appearing.  Patient is oriented to person, place, time, and situation. AFFECT: pleasant, lucid thought and speech. [id number]: no injection, icteris, swelling, or exudate.  EOMI, PERRLA. Mouth: lips without lesion/swelling.  Oral mucosa pink and moist. Oropharynx without erythema, exudate, or swelling.  No abnormal muscle activity of face/mouth noted today. Neck: supple/nontender.  No LAD, mass, or TM.  Carotid pulses 2+ bilaterally, without bruits. CV: RRR, no m/r/g.   LUNGS: CTA bilat, nonlabored resps, good aeration in all lung fields. EXT: no clubbing, cyanosis, or edema.  Neuro: CN 2-12 intact bilaterally, strength 5/5 in proximal and distal upper extremities and lower extremities bilateral.  No tremor.   No ataxia.  Upper extremity and lower extremity DTRs symmetric: UE 2+, LE 3+ patellar but cannot elicit achilles DTRs.  No clonus.  No pronator drift.  LABS:    Chemistry      Component Value Date/Time   NA 143 04/27/2015 1006   K 5.3* 04/27/2015 1006   CL 108 04/27/2015 1006   CO2 29 04/27/2015 1006   BUN 11 04/27/2015 1006   CREATININE 0.96 04/27/2015 1006   CREATININE 0.86 05/01/2012 1222      Component Value Date/Time   CALCIUM 10.2 04/27/2015 1006   ALKPHOS 74 04/27/2015 1006   AST 11 04/27/2015 1006   ALT 12 04/27/2015 1006   BILITOT 0.5 04/27/2015 1006       IMPRESSION AND PLAN:  1) Major depression, GAD, PTSD: stable at this time. She doesn't want to continue seeing the psychiatrist she saw 04/23/15.  Wants to continue seeing me for mgmt of psych meds. She feels  like she has "finished" counseling with the counselor she was seeing for a couple months [place] [facility]).  2) Med side effect: seems clearly related to latest increase in effexor, at least per pt's and husband's report today. Will decrease dosing back down to 150mg  qd (two of the effexor 75mg  ER generic tabs qd.  She has plenty of these right now so a new rx was not done today).  3) Low back pain with radiculopathy pain: her pain mgmt MD started  her on neurontin and I told her it was fine to start this as he recommended and we'll d/c her plavix at this time.  She was on the plavix b/c of past thought that she was perhaps having some TIAs related to her PFO----this was started by a physician she used to [place] out of state and I am ok with her going off it and seeing how she does.  4) Hypertension: bp control fine, no changes at this time.    5) Hx of hyperkalemia: was on potassium supplement at that time but recheck after getting off supplement was still mildly up. With latest muscle complaints I think it is prudent to recheck K today plus will check entire BMET with Mag and phos.  An After Visit Summary was printed and given to the patient.  FOLLOW UP: Return in about 1 month (around 08/01/2015) for f/u med side effect, etc (30 min).

## 2015-07-29 ENCOUNTER — Ambulatory Visit: Payer: Self-pay | Admitting: Family Medicine

## 2015-08-24 ENCOUNTER — Other Ambulatory Visit: Payer: Self-pay | Admitting: *Deleted

## 2015-08-24 NOTE — Telephone Encounter (Signed)
RF request for Levothyroxine LOV: 07/02/15 Next ov: 09/22/15 Last written: unknown - pt has endo Dr. Chalmers Cater but pharmacy sent request for Dr. Anitra Lauth. Spoke to pt and she stated that she only has to see Dr. Chalmers Cater once a year and that she thought Dr. Anitra Lauth could refill her medication. Please advise. Thanks.

## 2015-08-25 MED ORDER — LEVOTHYROXINE SODIUM 50 MCG PO TABS: 50.0000 ug | ORAL_TABLET | Freq: Every day | ORAL | Status: DC

## 2015-09-20 ENCOUNTER — Other Ambulatory Visit: Payer: Self-pay

## 2015-09-20 DIAGNOSIS — Z1231 Encounter for screening mammogram for malignant neoplasm of breast: Secondary | ICD-10-CM

## 2015-09-22 ENCOUNTER — Ambulatory Visit (INDEPENDENT_AMBULATORY_CARE_PROVIDER_SITE_OTHER): Payer: Medicare Other | Admitting: Family Medicine

## 2015-09-22 ENCOUNTER — Encounter: Payer: Self-pay | Admitting: Family Medicine

## 2015-09-22 VITALS — BP 133/84 | HR 79 | Temp 98.2°F | Resp 16 | Ht 61.5 in | Wt 154.2 lb

## 2015-09-22 DIAGNOSIS — F418 Other specified anxiety disorders: Secondary | ICD-10-CM | POA: Diagnosis not present

## 2015-09-22 DIAGNOSIS — E785 Hyperlipidemia, unspecified: Secondary | ICD-10-CM

## 2015-09-22 DIAGNOSIS — F419 Anxiety disorder, unspecified: Secondary | ICD-10-CM

## 2015-09-22 DIAGNOSIS — F4310 Post-traumatic stress disorder, unspecified: Secondary | ICD-10-CM

## 2015-09-22 DIAGNOSIS — G4700 Insomnia, unspecified: Secondary | ICD-10-CM | POA: Diagnosis not present

## 2015-09-22 DIAGNOSIS — F329 Major depressive disorder, single episode, unspecified: Secondary | ICD-10-CM

## 2015-09-22 DIAGNOSIS — I10 Essential (primary) hypertension: Secondary | ICD-10-CM | POA: Diagnosis not present

## 2015-09-22 LAB — LIPID PANEL
CHOL/HDL RATIO: 3
Cholesterol: 193 mg/dL (ref 0–200)
HDL: 55.4 mg/dL (ref >39.00)
LDL CALC: 107 mg/dL — AB (ref 0–99)
NonHDL: 137.16
TRIGLYCERIDES: 153 mg/dL — AB (ref 0.0–149.0)
VLDL: 30.6 mg/dL (ref 0.0–40.0)

## 2015-09-22 MED ORDER — CLOPIDOGREL BISULFATE 75 MG PO TABS: 75.0000 mg | ORAL_TABLET | Freq: Every day | ORAL | Status: DC

## 2015-09-22 MED ORDER — SUVOREXANT 10 MG PO TABS: 10.0000 mg | ORAL_TABLET | Freq: Every day | ORAL | Status: DC

## 2015-09-22 NOTE — Progress Notes (Signed)
Pre visit review using our clinic review tool, if applicable. No additional management support is needed unless otherwise documented below in the visit note. 

## 2015-09-22 NOTE — Progress Notes (Signed)
OFFICE VISIT  09/22/2015   CC:  Chief Complaint  Patient presents with  . Follow-up    Pt is fasting.      HPI:    Patient is a 75 y.o. Caucasian female who presents for 3 mo f/u HTN, anx/dep/PTSD, hyperlipidemia. She is fasting. She didn't tolerate the neurontin that her pain mgmt MD tried her on.  Back pain radiating around to groin region still comes and goes.  She is going to make f/u appt with pain mgmt. She got back on her plavix--husband seemed to think her mood was worse off this med and got better when she got back on it, but this was likely coincidental.  Last visit we backed down on her venlafaxine to 150 mg qd dose b/c she was having side effects from the higher dosing.  The side effects have resolved with decrease in dose.  She feels a fairly stable amount of chronic anxiety and depression--"I'll live with it".  She is not interested in any additional medication for this and she decided last visit to d/c counseling.  Sleep problems abound, as usual.  Trial of restoril 15mg  qhs has not helped, plus leaves her hungerover in morning.  Trial of ambien in remote past " made me crazy".  She recalls no other trials of rx sleep aids.  She is wound up, can't shut down, sleeps 3 hrs a night average. Overall energy level is poor, no excessive goal directed activity.  BP monitoring at home fine, compliant with meds. HLD: tolerating statin, compliant with this med.  Her hypothyroidism is followed by Dr. Chalmers Cater.   Past Medical History  Diagnosis Date  . Vertigo   . Anxiety and depression     with PTSD  . Hyperlipidemia   . TIA (transient ischemic attack)     patient on plavix,  saw Dr. Isabell Jarvis at [facility].hospital in [place]  . Arthritis   . PFO (patent foramen ovale) echo 03/21/05    "hole in heart" saw Dr. Sharee Holster [phone] [facility]. hospital.  Plavix med mgmt.  . Goiter     Lobectomy (left) of thyroid for benign nodule.  Right lobe nodules followed by Dr. Chalmers Cater  (FNA 2012 benign goiter) with ultrasounds and have been stable, most recently 01/10/13.    Marland Kitchen Urinary tract infection     hx of  . GERD (gastroesophageal reflux disease)   . Bilateral carpal tunnel syndrome   . Colitis     hx of  . Cataracts, bilateral   . Hypertension   . Rectocele   . H/O adenomatous polyp of colon 2012    5 yr recall  . Chronic renal insufficiency, stage II (mild) 2015    CrCl about 60 ml/min  . Subclinical hypothyroidism     Dr. Chalmers Cater has her on 25 mcg synthroid qd as of 01/2014  . Lumbar spondylolysis 2016    L5: with grade I spondylolisthesis L5 on S1  . DDD (degenerative disc disease), lumbar 2016    L1-2 and L5-S1 fairly severe--intramuscular injection of steroid and toradol by Dr. Antonietta Jewel 02/24/14 to calm down the pain    Past Surgical History  Procedure Laterality Date  . Abdominal hysterectomy    . Bladder suspension    . Thyroidectomy      Left lobectomy  . Rectocele repair      purse string  . Colonoscopy w/ polypectomy  04/2011    +Diverticulosis.  Adenoma--recall 5 yrs  . Breast surgery      "  breast lumps removed"  . Dilation and curettage of uterus    . Total knee arthroplasty  07/26/2012    Procedure: TOTAL KNEE ARTHROPLASTY;  Surgeon: Yvette Rack., MD;  Location: [place];  Service: Orthopedics;  Laterality: Right;    Outpatient Prescriptions Prior to Visit  Medication Sig Dispense Refill  . amLODipine (NORVASC) 5 MG tablet Take 1 tablet (5 mg total) by mouth daily. 90 tablet 2  . atorvastatin (LIPITOR) 20 MG tablet Take 1 tablet (20 mg total) by mouth daily. 90 tablet 1  . calcium carbonate (OS-CAL - DOSED IN MG OF ELEMENTAL CALCIUM) 1250 MG tablet Take 1 tablet by mouth.    . levothyroxine (SYNTHROID, LEVOTHROID) 50 MCG tablet Take 1 tablet (50 mcg total) by mouth daily before breakfast. 30 tablet 6  . losartan (COZAAR) 100 MG tablet TAKE 1 TABLET BY MOUTH EVERY DAY 90 tablet 1  . Multiple Vitamins-Minerals (WOMENS MULTIVITAMIN PLUS) TABS  Take by mouth. With Calcium, Zinc & Iron.    . Omega-3 Fatty Acids (FISH OIL) 1200 MG CAPS Take 1,200 mg by mouth daily.      Marland Kitchen venlafaxine XR (EFFEXOR-XR) 75 MG 24 hr capsule 2 caps po qd 60 capsule 6   No facility-administered medications prior to visit.    Allergies  Allergen Reactions  . Sulfa Drugs Cross Reactors Rash    ROS As per HPI  PE: Blood pressure 133/84, pulse 79, temperature 98.2 F (36.8 C), temperature source Oral, resp. rate 16, height 5' 1.5" (1.562 m), weight 154 lb 4 oz (69.967 kg), SpO2 96 %. Gen: Alert, well appearing.  Patient is oriented to person, place, time, and situation. AFFECT: pleasant, lucid thought and speech. No further exam today.  LABS:  Lab Results  Component Value Date   TSH 2.50 09/23/2012   Lab Results  Component Value Date   WBC 7.4 12/24/2013   HGB 14.7 12/24/2013   HCT 43.6 12/24/2013   MCV 89.9 12/24/2013   PLT 268.0 12/24/2013   Lab Results  Component Value Date   CREATININE 0.88 07/02/2015   BUN 15 07/02/2015   NA 142 07/02/2015   K 5.0 07/02/2015   CL 107 07/02/2015   CO2 27 07/02/2015   Lab Results  Component Value Date   ALT 12 04/27/2015   AST 11 04/27/2015   ALKPHOS 74 04/27/2015   BILITOT 0.5 04/27/2015   Lab Results  Component Value Date   CHOL 180 09/02/2014   Lab Results  Component Value Date   HDL 53.60 09/02/2014   Lab Results  Component Value Date   LDLCALC 101* 09/02/2014   Lab Results  Component Value Date   TRIG 129.0 09/02/2014   Lab Results  Component Value Date   CHOLHDL 3 09/02/2014   IMPRESSION AND PLAN:  1) Anxiety and depression, PTSD component playing a large role: fairly stable. No changes today.  She wants to consider ween down/off venlafaxine but I told her to wait at least 4-6 more months and if still stable at that time we'll try this. She has failed a ween trial in the past.  2) HTN; The current medical regimen is effective;  continue present plan and  medications. Lytes/cr good 06/2015.  3) Hyperlipidemia: time for recheck FLP today (fasting).  Tolerating statin.  AST/ALT good 04/2015.  4) Insomnia: failed 2 meds.  We'll try belsomra 10mg  qhs.  Spent 25 min with pt today, with >50% of this time spent in counseling and care coordination regarding  the above problems.  An After Visit Summary was printed and given to the patient.   FOLLOW UP: Return in about 4 months (around 01/20/2016) for medicare AWV.

## 2015-10-11 ENCOUNTER — Other Ambulatory Visit: Payer: Self-pay | Admitting: Family Medicine

## 2015-10-11 ENCOUNTER — Ambulatory Visit
Admission: RE | Admit: 2015-10-11 | Discharge: 2015-10-11 | Disposition: A | Discharge status: Home / Self Care | Payer: Medicare Other | Source: Ambulatory Visit

## 2015-10-11 DIAGNOSIS — N63 Unspecified lump in unspecified breast: Secondary | ICD-10-CM

## 2015-10-11 DIAGNOSIS — Z1231 Encounter for screening mammogram for malignant neoplasm of breast: Secondary | ICD-10-CM

## 2015-10-19 ENCOUNTER — Telehealth: Payer: Self-pay

## 2015-10-19 NOTE — Telephone Encounter (Signed)
URGENT Request for For bilateral US guided Core Biopsy.  Form has been signed by Dr. Howard Pouch in PCP absence and faxed back to [phone]

## 2015-10-21 ENCOUNTER — Ambulatory Visit
Admission: RE | Admit: 2015-10-21 | Discharge: 2015-10-21 | Disposition: A | Discharge status: Home / Self Care | Payer: Medicare Other | Source: Ambulatory Visit | Attending: Family Medicine | Admitting: Family Medicine

## 2015-10-21 DIAGNOSIS — R928 Other abnormal and inconclusive findings on diagnostic imaging of breast: Secondary | ICD-10-CM | POA: Diagnosis not present

## 2015-10-21 DIAGNOSIS — N6489 Other specified disorders of breast: Secondary | ICD-10-CM | POA: Diagnosis not present

## 2015-10-21 DIAGNOSIS — N63 Unspecified lump in unspecified breast: Secondary | ICD-10-CM

## 2015-12-02 DIAGNOSIS — L918 Other hypertrophic disorders of the skin: Secondary | ICD-10-CM | POA: Diagnosis not present

## 2015-12-02 DIAGNOSIS — L821 Other seborrheic keratosis: Secondary | ICD-10-CM | POA: Diagnosis not present

## 2015-12-02 DIAGNOSIS — D045 Carcinoma in situ of skin of trunk: Secondary | ICD-10-CM | POA: Diagnosis not present

## 2015-12-02 DIAGNOSIS — D1801 Hemangioma of skin and subcutaneous tissue: Secondary | ICD-10-CM | POA: Diagnosis not present

## 2015-12-02 DIAGNOSIS — D485 Neoplasm of uncertain behavior of skin: Secondary | ICD-10-CM | POA: Diagnosis not present

## 2015-12-02 DIAGNOSIS — L820 Inflamed seborrheic keratosis: Secondary | ICD-10-CM | POA: Diagnosis not present

## 2015-12-02 DIAGNOSIS — L814 Other melanin hyperpigmentation: Secondary | ICD-10-CM | POA: Diagnosis not present

## 2015-12-08 DIAGNOSIS — H5203 Hypermetropia, bilateral: Secondary | ICD-10-CM | POA: Diagnosis not present

## 2015-12-08 DIAGNOSIS — D2311 Other benign neoplasm of skin of right eyelid, including canthus: Secondary | ICD-10-CM | POA: Diagnosis not present

## 2015-12-08 DIAGNOSIS — H2513 Age-related nuclear cataract, bilateral: Secondary | ICD-10-CM | POA: Diagnosis not present

## 2015-12-08 DIAGNOSIS — D2312 Other benign neoplasm of skin of left eyelid, including canthus: Secondary | ICD-10-CM | POA: Diagnosis not present

## 2015-12-27 ENCOUNTER — Other Ambulatory Visit: Payer: Self-pay | Admitting: Family Medicine

## 2015-12-27 MED ORDER — LOSARTAN POTASSIUM 100 MG PO TABS: ORAL_TABLET | ORAL | Status: DC

## 2015-12-31 DIAGNOSIS — D045 Carcinoma in situ of skin of trunk: Secondary | ICD-10-CM | POA: Diagnosis not present

## 2016-01-14 ENCOUNTER — Other Ambulatory Visit: Payer: Self-pay | Admitting: Family Medicine

## 2016-01-14 NOTE — Telephone Encounter (Signed)
RF request for amlodipine LOV: 09/22/15 Next ov: 02/01/16  Last written: 01/04/15 #90 w/ 2RF

## 2016-01-20 ENCOUNTER — Ambulatory Visit: Payer: Self-pay | Admitting: Family Medicine

## 2016-01-25 DIAGNOSIS — E039 Hypothyroidism, unspecified: Secondary | ICD-10-CM | POA: Diagnosis not present

## 2016-02-01 ENCOUNTER — Ambulatory Visit: Payer: Medicare Other | Admitting: Family Medicine

## 2016-02-01 ENCOUNTER — Other Ambulatory Visit: Payer: Self-pay | Admitting: Endocrinology

## 2016-02-01 DIAGNOSIS — E039 Hypothyroidism, unspecified: Secondary | ICD-10-CM | POA: Diagnosis not present

## 2016-02-01 DIAGNOSIS — E049 Nontoxic goiter, unspecified: Secondary | ICD-10-CM

## 2016-02-08 ENCOUNTER — Ambulatory Visit
Admission: RE | Admit: 2016-02-08 | Discharge: 2016-02-08 | Disposition: A | Discharge status: Home / Self Care | Payer: Medicare Other | Source: Ambulatory Visit | Attending: Endocrinology | Admitting: Endocrinology

## 2016-02-08 DIAGNOSIS — E042 Nontoxic multinodular goiter: Secondary | ICD-10-CM | POA: Diagnosis not present

## 2016-02-08 DIAGNOSIS — E049 Nontoxic goiter, unspecified: Secondary | ICD-10-CM

## 2016-02-16 ENCOUNTER — Encounter: Payer: Self-pay | Admitting: Gastroenterology

## 2016-02-18 ENCOUNTER — Ambulatory Visit (INDEPENDENT_AMBULATORY_CARE_PROVIDER_SITE_OTHER): Payer: Medicare Other | Admitting: Family Medicine

## 2016-02-18 ENCOUNTER — Encounter: Payer: Self-pay | Admitting: Family Medicine

## 2016-02-18 ENCOUNTER — Telehealth: Payer: Self-pay | Admitting: *Deleted

## 2016-02-18 VITALS — BP 147/84 | HR 63 | Temp 98.4°F | Resp 16 | Ht 62.0 in | Wt 158.5 lb

## 2016-02-18 DIAGNOSIS — Z0000 Encounter for general adult medical examination without abnormal findings: Secondary | ICD-10-CM | POA: Diagnosis not present

## 2016-02-18 DIAGNOSIS — E2839 Other primary ovarian failure: Secondary | ICD-10-CM

## 2016-02-18 MED ORDER — VENLAFAXINE HCL ER 37.5 MG PO CP24: 37.5000 mg | ORAL_CAPSULE | Freq: Every day | ORAL | Status: DC

## 2016-02-18 MED ORDER — VENLAFAXINE HCL ER 75 MG PO CP24: ORAL_CAPSULE | ORAL | Status: DC

## 2016-02-18 NOTE — Telephone Encounter (Signed)
Pt was in office today for [place]. Dr. Anitra Lauth had seen pt and had printed AVS for d/c. At d/c pt stated that she had two questions for Dr. Anitra Lauth.   1) She wanted to know if her effexor could be decreased to one 75mg  tablet daily and one 37.5mg  tablet daily? She stated that taking 2 of the 75mg  tablets is causing a jerking problem in her jaw. She is request a 30 day trial for this.  2) She also wanted to know if another medication could be added to help with her emotions/crying?  Please advise. Thanks.

## 2016-02-18 NOTE — Progress Notes (Signed)
Pre visit review using our clinic review tool, if applicable. No additional management support is needed unless otherwise documented below in the visit note. 

## 2016-02-18 NOTE — Progress Notes (Addendum)
The patient is here for annual Medicare wellness examination and management of other chronic and acute problems. Other problems discussed today:  Pt asked to have her venlafaxine XR dosing decreased to 75mg  + 37.5mg  qd due to some jaw spasm that she associates with 150mg  venlafaxine dosing.  I sent in new rx's to reflect this new dosing today.  Lab Results  Component Value Date   TSH 2.50 09/23/2012   Lab Results  Component Value Date   WBC 7.4 12/24/2013   HGB 14.7 12/24/2013   HCT 43.6 12/24/2013   MCV 89.9 12/24/2013   PLT 268.0 12/24/2013   Lab Results  Component Value Date   CREATININE 0.88 07/02/2015   BUN 15 07/02/2015   NA 142 07/02/2015   K 5.0 07/02/2015   CL 107 07/02/2015   CO2 27 07/02/2015  Glucose 07/02/15 was 75 (fasting) Lab Results  Component Value Date   ALT 12 04/27/2015   AST 11 04/27/2015   ALKPHOS 74 04/27/2015   BILITOT 0.5 04/27/2015   Lab Results  Component Value Date   CHOL 193 09/22/2015   Lab Results  Component Value Date   HDL 55.40 09/22/2015   Lab Results  Component Value Date   LDLCALC 107* 09/22/2015   Lab Results  Component Value Date   TRIG 153.0* 09/22/2015   Lab Results  Component Value Date   CHOLHDL 3 09/22/2015     AWV DATA The risk factors are reflected in the social history.  The roster of all physicians providing medical care to patient is listed in the Snapshot section of the chart.  Activities of daily living:  The patient is 100% independent in all ADLs: dressing, toileting, feeding as well as independent mobility.  Home safety : The patient has smoke detectors in the home. They wear seatbelts. No firearms at home ( firearms are present in the home, kept in a safe fashion). There is no violence in the home.   There is no risks for hepatitis, STDs or HIV. There is no history of blood transfusion. They have no travel history to infectious disease endemic areas of the world.  The patient has seen their dentist  in the last six months but goes next week (annual).. They have seen their eye doctor in the last year. They deny any hearing difficulty and have not had audiologic testing in the last year.  They do not  have excessive sun exposure.  Discussed the need for sun protection: hats, long sleeves and use of sunscreen if there is significant sun exposure.   Diet: the importance of a healthy diet is discussed. They do have a fairly healthy diet.  The patient does not have a regular exercise program.  The benefits of regular aerobic exercise were discussed.  Depression screen: Pt has hx of depression and is currently taking antidepressant.  Cognitive assessment: the patient manages all their financial and personal affairs and is actively engaged. They could relate day,date,year and events; recalled 3/3 objects at 3 minutes; performed clock-face test normally.  Reviewed advance directives with pt today.  Pt has the resources she needs.  The following portions of the patient's history were reviewed and updated as appropriate: allergies, current medications, past family history, past medical history,  past surgical history, past social history  and problem list.  Vision, hearing, body mass index were assessed and reviewed. Body mass index is 28.98 kg/(m^2).   During the course of the visit the patient was educated and counseled about appropriate  screening and preventive services including :  Annual wellness visit doing today. diabetes screening: last screening 06/2015 and was normal.  Will repeat after 06/2016. colorectal cancer screening: pt has plans to schedule a repeat colonoscopy with Dr. Fuller Plan very soon. recommended immunizations (influenza, pneumococcal, Hep B): UTD appropriately. Bone mass measurement: pt due; will order. Counseling to prevent tobacco use: N/A Depression screening: N/A Glaucoma screening: gets this at eye MD Hepatitis C virus screening: doesn't qualify HIV virus screening: pt  doesn't want this. Lung cancer screening: doesn't qualify Medical nutrition therapy: N/a Prostate cancer screening: N/A Screening mammography: pt is UTD Screening pap tests, pelvic exam, and clinical breast exam: pt has had hysterectomy for benign reasons. Ultrasound screening for AAA: doesn't qualify.  A written plan of action regarding the above screening and preventative services was given to the patient today.  Will order DEXA.  No further orders needed today.  An After Visit Summary was printed and given to the patient.  Signed:  Crissie Sickles, MD           02/18/2016

## 2016-02-18 NOTE — Telephone Encounter (Signed)
I have already made the changes to her venlafaxine as per her request. Pls call her and tell her I want to start her on a med called aripiprazole for additional help with her depression/crying. If pt is agreeable to this, please eRx aripiprazole 2mg , 1 tab po qd, #30, RF x 1.  F/u in office in 1 month.-thx

## 2016-02-18 NOTE — Addendum Note (Signed)
Addended by: Tammi Sou on: 02/18/2016 01:44 PM   Modules accepted: Orders

## 2016-02-21 MED ORDER — ARIPIPRAZOLE 2 MG PO TABS: 2.0000 mg | ORAL_TABLET | Freq: Every day | ORAL | Status: DC

## 2016-02-21 NOTE — Telephone Encounter (Signed)
Patient aware of new medication.  Rx sent to pharmacy.  Appt schedule to discuss anxiety / depression med changes ( 30 minute ) 03/23/16.

## 2016-03-01 ENCOUNTER — Telehealth: Payer: Self-pay | Admitting: *Deleted

## 2016-03-01 NOTE — Telephone Encounter (Signed)
Noted. The med she is referring to is generic abilify.

## 2016-03-01 NOTE — Telephone Encounter (Signed)
FYI. Pt LMOM on 03/01/16 at 10:00am stating that she was not able to afford the new Rx that was sent in. She stated that she will just try to "do" on her own since she cannot afford the new medication.

## 2016-03-06 ENCOUNTER — Other Ambulatory Visit: Payer: Self-pay | Admitting: Family Medicine

## 2016-03-06 NOTE — Telephone Encounter (Signed)
RF request for clopidogrel LOV: 02/18/16 Next ov: 06/21/16 Last written: 09/23/15 #90 w/ 3RF - this rx was printed, pharmacy did not receive.

## 2016-03-07 DIAGNOSIS — M25561 Pain in right knee: Secondary | ICD-10-CM | POA: Diagnosis not present

## 2016-03-07 DIAGNOSIS — M25551 Pain in right hip: Secondary | ICD-10-CM | POA: Diagnosis not present

## 2016-03-07 DIAGNOSIS — M4316 Spondylolisthesis, lumbar region: Secondary | ICD-10-CM | POA: Diagnosis not present

## 2016-03-07 DIAGNOSIS — M47816 Spondylosis without myelopathy or radiculopathy, lumbar region: Secondary | ICD-10-CM | POA: Diagnosis not present

## 2016-03-15 NOTE — Telephone Encounter (Signed)
No.  She can cancel this and we can have another o/v in 4 months if this is ok with her.-thx

## 2016-03-15 NOTE — Telephone Encounter (Signed)
Patient inquiring.  Does she need to keep appointment 03/23/16 since she didn't start generic abilify?  Please advise.

## 2016-03-16 NOTE — Telephone Encounter (Signed)
Patient aware. Appointment canceled.  Pt will keep November appt.

## 2016-03-23 ENCOUNTER — Ambulatory Visit: Payer: Self-pay | Admitting: Family Medicine

## 2016-04-07 ENCOUNTER — Other Ambulatory Visit: Payer: Self-pay | Admitting: Family Medicine

## 2016-04-26 DIAGNOSIS — M4300 Spondylolysis, site unspecified: Secondary | ICD-10-CM | POA: Diagnosis not present

## 2016-04-26 DIAGNOSIS — Q211 Atrial septal defect: Secondary | ICD-10-CM | POA: Diagnosis not present

## 2016-04-26 DIAGNOSIS — M4317 Spondylolisthesis, lumbosacral region: Secondary | ICD-10-CM | POA: Diagnosis not present

## 2016-04-26 DIAGNOSIS — M5416 Radiculopathy, lumbar region: Secondary | ICD-10-CM | POA: Diagnosis not present

## 2016-04-26 DIAGNOSIS — I10 Essential (primary) hypertension: Secondary | ICD-10-CM | POA: Diagnosis not present

## 2016-04-26 DIAGNOSIS — Z6829 Body mass index (BMI) 29.0-29.9, adult: Secondary | ICD-10-CM | POA: Diagnosis not present

## 2016-05-03 DIAGNOSIS — M5136 Other intervertebral disc degeneration, lumbar region: Secondary | ICD-10-CM | POA: Diagnosis not present

## 2016-05-03 DIAGNOSIS — M4300 Spondylolysis, site unspecified: Secondary | ICD-10-CM | POA: Diagnosis not present

## 2016-05-04 DIAGNOSIS — L739 Follicular disorder, unspecified: Secondary | ICD-10-CM | POA: Diagnosis not present

## 2016-05-04 DIAGNOSIS — D485 Neoplasm of uncertain behavior of skin: Secondary | ICD-10-CM | POA: Diagnosis not present

## 2016-05-04 DIAGNOSIS — Z85828 Personal history of other malignant neoplasm of skin: Secondary | ICD-10-CM | POA: Diagnosis not present

## 2016-05-04 DIAGNOSIS — L570 Actinic keratosis: Secondary | ICD-10-CM | POA: Diagnosis not present

## 2016-05-04 DIAGNOSIS — L821 Other seborrheic keratosis: Secondary | ICD-10-CM | POA: Diagnosis not present

## 2016-05-11 ENCOUNTER — Telehealth: Payer: Self-pay | Admitting: *Deleted

## 2016-05-11 ENCOUNTER — Encounter: Payer: Self-pay | Admitting: Gastroenterology

## 2016-05-11 ENCOUNTER — Ambulatory Visit (INDEPENDENT_AMBULATORY_CARE_PROVIDER_SITE_OTHER): Payer: Medicare Other | Admitting: Gastroenterology

## 2016-05-11 VITALS — BP 130/88 | HR 68 | Ht 61.5 in | Wt 161.1 lb

## 2016-05-11 DIAGNOSIS — Z86010 Personal history of colonic polyps: Secondary | ICD-10-CM

## 2016-05-11 DIAGNOSIS — R198 Other specified symptoms and signs involving the digestive system and abdomen: Secondary | ICD-10-CM

## 2016-05-11 DIAGNOSIS — N816 Rectocele: Secondary | ICD-10-CM

## 2016-05-11 MED ORDER — NA SULFATE-K SULFATE-MG SULF 17.5-3.13-1.6 GM/177ML PO SOLN: ORAL | 0 refills | Status: DC

## 2016-05-11 NOTE — Telephone Encounter (Signed)
I have spoken to patient to advise that per Dr Anitra Lauth, she may hold Plavix 5 days prior to procedure. She verbalizes understanding.

## 2016-05-11 NOTE — Patient Instructions (Addendum)
You have been scheduled for a colonoscopy. Please follow written instructions given to you at your visit today.  Please pick up your prep supplies at the pharmacy within the next 1-3 days. If you use inhalers (even only as needed), please bring them with you on the day of your procedure. Your physician has requested that you go to www.[email or web address] and enter the access code given to you at your visit today. This web site gives a general overview about your procedure. However, you should still follow specific instructions given to you by our office regarding your preparation for the procedure.  We have scheduled you to see Dr Toney Rakes for your ? Rectocele on 05/22/16 at 3 pm. Arrive at 2:45 pm for registration.  If you are age 58 or older, your body mass index should be between 23-30. Your Body mass index is 29.95 kg/m. If this is out of the aforementioned range listed, please consider follow up with your Primary Care Provider.  If you are age 75 or younger, your body mass index should be between 19-25. Your Body mass index is 29.95 kg/m. If this is out of the aformentioned range listed, please consider follow up with your Primary Care Provider.

## 2016-05-11 NOTE — Telephone Encounter (Signed)
Yes, Carla Spencer may come off her plavix 5 days prior to her procedure.

## 2016-05-11 NOTE — Progress Notes (Signed)
    History of Present Illness: This is a 75 year old female here for the evaluation of a history of adenomatous colon polyps on Plavix. She is accompanied by her husband. She relates persistent difficulties with fecal evacuation, same as described 5 years ago. She applies pressure to her rectal area and buttocks and readjusts her position to allow defecation. She has been told of a rectocele. She has not followed up with GYN as was previously recommended. She has a history of an ASD with TIA and is maintained on Plavix. Denies weight loss, abdominal pain, diarrhea, change in stool caliber, melena, hematochezia, nausea, vomiting, dysphagia, reflux symptoms, chest pain.  Review of Systems: Pertinent positive and negative review of systems were noted in the above HPI section. All other review of systems were otherwise negative.  Current Medications, Allergies, Past Medical History, Past Surgical History, Family History and Social History were reviewed in [employer] record.  Physical Exam: General: Well developed, well nourished, no acute distress Head: Normocephalic and atraumatic Eyes:  sclerae anicteric, EOMI Ears: Normal auditory acuity Mouth: No deformity or lesions Neck: Supple, no masses or thyromegaly Lungs: Clear throughout to auscultation Heart: Regular rate and rhythm; no murmurs, rubs or bruits Abdomen: Soft, non tender and non distended. No masses, hepatosplenomegaly or hernias noted. Normal Bowel sounds Rectal: deferred to colonoscopy Musculoskeletal: Symmetrical with no gross deformities  Skin: No lesions on visible extremities Pulses:  Normal pulses noted Extremities: No clubbing, cyanosis, edema or deformities noted Neurological: Alert oriented x 4, grossly nonfocal Cervical Nodes:  No significant cervical adenopathy Inguinal Nodes: No significant inguinal adenopathy Psychological:  Alert and cooperative. Normal mood and affect  Assessment and  Recommendations:  1. History of adenomatous colon polyps. She is due for a five-year surveillance colonoscopy. Schedule colonoscopy. The risks (including bleeding, perforation, infection, missed lesions, medication reactions and possible hospitalization or surgery if complications occur), benefits, and alternatives to colonoscopy with possible biopsy and possible polypectomy were discussed with the patient and they consent to proceed.   2. Suspected rectocele leading to difficulty with fecal evacuation.Marland Kitchen GYN referral for further evaluation.  3. History of ASD with TIAs maintained. Hold Plavix 5 days before procedure - will instruct when and how to resume after procedure. Low but real risk of cardiovascular event such as heart attack, stroke, embolism, thrombosis or ischemia/infarct of other organs off Plavix explained and need to seek urgent help if this occurs. The patient consents to proceed. Will communicate by phone or EMR with patient's prescribing provider to confirm that holding Plavix is reasonable in this case.

## 2016-05-11 NOTE — Telephone Encounter (Signed)
05/11/2016  RE: Carla Spencer DOB: 02-24-1941 MRN: [id number]  Dear Dr Anitra Lauth,   We have scheduled the above patient for an endoscopic procedure. Our records show that she is on anticoagulation therapy.   Please advise as to whether the patient may come off her therapy of Plavix 5 days prior to the procedure, which is scheduled for 05/29/16.  Please route your response to Dixon Boos, CMA.  Sincerely,   Dixon Boos

## 2016-05-14 DIAGNOSIS — K469 Unspecified abdominal hernia without obstruction or gangrene: Secondary | ICD-10-CM

## 2016-05-14 HISTORY — DX: Unspecified abdominal hernia without obstruction or gangrene: K46.9

## 2016-05-14 HISTORY — PX: OTHER SURGICAL HISTORY: SHX169

## 2016-05-15 ENCOUNTER — Encounter: Payer: Self-pay | Admitting: Family Medicine

## 2016-05-15 DIAGNOSIS — M4317 Spondylolisthesis, lumbosacral region: Secondary | ICD-10-CM | POA: Diagnosis not present

## 2016-05-15 DIAGNOSIS — Q211 Atrial septal defect: Secondary | ICD-10-CM | POA: Diagnosis not present

## 2016-05-15 DIAGNOSIS — M5126 Other intervertebral disc displacement, lumbar region: Secondary | ICD-10-CM | POA: Diagnosis not present

## 2016-05-15 DIAGNOSIS — Z6829 Body mass index (BMI) 29.0-29.9, adult: Secondary | ICD-10-CM | POA: Diagnosis not present

## 2016-05-15 DIAGNOSIS — M5416 Radiculopathy, lumbar region: Secondary | ICD-10-CM | POA: Diagnosis not present

## 2016-05-15 DIAGNOSIS — I10 Essential (primary) hypertension: Secondary | ICD-10-CM | POA: Diagnosis not present

## 2016-05-15 NOTE — Progress Notes (Signed)
Cardiology Office Note   Date:  05/15/2016   ID:  Carla Spencer, DOB 04-14-1941, MRN [id number]  PCP:  Tammi Sou, MD  Cardiologist:   Jenkins Rouge, MD   No chief complaint on file.     History of Present Illness: Carla Spencer is a 75 y.o. female who presents for ASD.  History of HTN, elevated lipids, anxiety , depression and PTSD.  Chronic back pain sees pain management Hypothyroidism followed by Dr  Joellyn Rued that she actually has a PFO and not ASD with echo done in 2006 [place] started  On plavix.   There seems to be a lot of confusion on her diagnosis. They indicated initial problem was in 2008/9 Abnormal MRI With "lesions" in brain However she went to [facility] for 2nd opinion and was told she had depression and No organic issue  Currently complains of mood and sleep disorder. No focal neurologic issues. Has not had MRI here since 2012 And review of that shows only small vessel white matter changes that are non specific  No palpitations history of afib   Past Medical History:  Diagnosis Date  . Anxiety and depression    with PTSD  . Arthritis   . Bilateral carpal tunnel syndrome   . Cataracts, bilateral   . Chronic renal insufficiency, stage II (mild) 2015   CrCl about 60 ml/min  . Colitis    hx of  . DDD (degenerative disc disease), lumbar 2016   L1-2 and L5-S1 fairly severe--intramuscular injection of steroid and toradol by Dr. Antonietta Jewel 02/24/14 to calm down the pain  . GERD (gastroesophageal reflux disease)   . Goiter    Lobectomy (left) of thyroid for benign nodule.  Right lobe nodules followed by Dr. Chalmers Cater (FNA 2012 benign goiter) with ultrasounds and have been stable, most recently 01/10/13.    . H/O adenomatous polyp of colon 2012   5 yr recall  . Hyperlipidemia   . Hypertension   . Lumbar spondylolysis 2016   L5: with grade I spondylolisthesis L5 on S1  . Osteopenia 2012  . PFO (patent foramen ovale) echo 03/21/05   "hole in  heart" saw Dr. Sharee Holster [phone] [facility].  Plavix med mgmt.  . Rectocele   . Subclinical hypothyroidism    Dr. Chalmers Cater has her on 25 mcg synthroid qd as of 01/2014  . TIA (transient ischemic attack)    patient on plavix,  saw Dr. Isabell Jarvis at [facility]hospital in [place]  . Urinary tract infection    hx of  . Vertigo     Past Surgical History:  Procedure Laterality Date  . ABDOMINAL HYSTERECTOMY    . BLADDER SUSPENSION    . BREAST SURGERY     "breast lumps removed"  . COLONOSCOPY W/ POLYPECTOMY  04/2011   +Diverticulosis.  Adenoma--recall 5 yrs  . DEXA  05/23/11   T-score -2.2  . DILATION AND CURETTAGE OF UTERUS    . RECTOCELE REPAIR     purse string  . THYROIDECTOMY     Left lobectomy  . TOTAL KNEE ARTHROPLASTY  07/26/2012   Procedure: TOTAL KNEE ARTHROPLASTY;  Surgeon: Yvette Rack., MD;  Location: [place];  Service: Orthopedics;  Laterality: Right;     Current Outpatient Prescriptions  Medication Sig Dispense Refill  . amLODipine (NORVASC) 5 MG tablet TAKE ONE TABLET BY MOUTH ONCE DAILY 90 tablet 3  . atorvastatin (LIPITOR) 20 MG tablet TAKE ONE TABLET BY MOUTH  ONCE DAILY 90 tablet 1  . calcium carbonate (OS-CAL - DOSED IN MG OF ELEMENTAL CALCIUM) 1250 MG tablet Take 1 tablet by mouth.    . clopidogrel (PLAVIX) 75 MG tablet TAKE ONE TABLET BY MOUTH ONCE DAILY 90 tablet 3  . levothyroxine (SYNTHROID, LEVOTHROID) 50 MCG tablet Take 1 tablet (50 mcg total) by mouth daily before breakfast. 30 tablet 6  . losartan (COZAAR) 100 MG tablet TAKE 1 TABLET BY MOUTH EVERY DAY 90 tablet 1  . Multiple Vitamins-Minerals (WOMENS MULTIVITAMIN PLUS) TABS Take by mouth. With Calcium, Zinc & Iron.    . Na Sulfate-K Sulfate-Mg Sulf 17.5-3.13-1.6 GM/180ML SOLN Suprep-Use as directed 354 mL 0  . Omega-3 Fatty Acids (FISH OIL) 1200 MG CAPS Take 1,200 mg by mouth daily.      Marland Kitchen venlafaxine XR (EFFEXOR-XR) 75 MG 24 hr capsule 1 cap po qd 30 capsule 6   No current  facility-administered medications for this visit.     Allergies:   Sulfa drugs cross reactors    Social History:  The patient  reports that she has quit smoking. She has never used smokeless tobacco. She reports that she does not drink alcohol or use drugs.   Family History:  The patient's family history includes Colon polyps in her daughter; Heart disease in her mother; Stomach cancer in her father.    ROS:  Please see the history of present illness.   Otherwise, review of systems are positive for none.   All other systems are reviewed and negative.    PHYSICAL EXAM: VS:  There were no vitals taken for this visit. , BMI There is no height or weight on file to calculate BMI. Affect appropriate Healthy:  appears stated age 42: normal Neck supple with no adenopathy JVP normal no bruits no thyromegaly Lungs clear with no wheezing and good diaphragmatic motion Heart:  S1/S2 no murmur, no rub, gallop or click PMI normal Abdomen: benighn, BS positve, no tenderness, no AAA no bruit.  No HSM or HJR Distal pulses intact with no bruits No edema Neuro non-focal Skin warm and dry No muscular weakness    EKG:   07/29/12  SR rate 56 nonspecific ST/T wave changes    Recent Labs: 07/02/2015: BUN 15; Creat 0.88; Magnesium 2.1; Potassium 5.0; Sodium 142    Lipid Panel    Component Value Date/Time   CHOL 193 09/22/2015 1047   TRIG 153.0 (H) 09/22/2015 1047   HDL 55.40 09/22/2015 1047   CHOLHDL 3 09/22/2015 1047   VLDL 30.6 09/22/2015 1047   LDLCALC 107 (H) 09/22/2015 1047      Wt Readings from Last 3 Encounters:  05/11/16 73.1 kg (161 lb 2 oz)  02/18/16 71.9 kg (158 lb 8 oz)  09/22/15 70 kg (154 lb 4 oz)      Other studies Reviewed: Additional studies/ records that were reviewed today include: Notes from primary , neuro and [place].    ASSESSMENT AND PLAN:  1.  Abnormal Brain MRI;  Will repeat and refer to neuro. Seems she needs more neuropsychiatric testing for    Depression PTSD and memory loss. Doubt there is an organic brain issue from multiple embolic events 2. Hole in Heart:  Doubt ASD likely PFO of no clinical significance will order echo with bubble study 3. HTN: Well controlled.  Continue current medications and low sodium Dash type diet.   4. Chol:   Cholesterol is at goal.  Continue current dose of statin and diet Rx.  No  myalgias or side effects.  F/U  LFT's in 6 months. Lab Results  Component Value Date   LDLCALC 107 (H) 09/22/2015            5. Thyroid :  Continue current replacement TSH normal    Current medicines are reviewed at length with the patient today.  The patient does not have concerns regarding medicines.  The following changes have been made:  no change  Labs/ tests ordered today include: MRI head and echo with bubble study No orders of the defined types were placed in this encounter.    Disposition:   FU with me PRN referred to neurology      Signed, Jenkins Rouge, MD  05/15/2016 3:16 PM    [facility] [address], [place], [place]  [address] Phone: [phone]; Fax: [phone]

## 2016-05-17 ENCOUNTER — Encounter: Payer: Self-pay | Admitting: Cardiovascular Disease

## 2016-05-17 ENCOUNTER — Ambulatory Visit (INDEPENDENT_AMBULATORY_CARE_PROVIDER_SITE_OTHER): Payer: Medicare Other | Admitting: Cardiovascular Disease

## 2016-05-17 VITALS — BP 126/84 | HR 59 | Ht 62.0 in | Wt 161.0 lb

## 2016-05-17 DIAGNOSIS — Z7689 Persons encountering health services in other specified circumstances: Secondary | ICD-10-CM | POA: Diagnosis not present

## 2016-05-17 DIAGNOSIS — R413 Other amnesia: Secondary | ICD-10-CM

## 2016-05-17 DIAGNOSIS — Q2112 Patent foramen ovale: Secondary | ICD-10-CM

## 2016-05-17 DIAGNOSIS — Q211 Atrial septal defect: Secondary | ICD-10-CM | POA: Diagnosis not present

## 2016-05-17 DIAGNOSIS — Z23 Encounter for immunization: Secondary | ICD-10-CM

## 2016-05-17 DIAGNOSIS — R930 Abnormal findings on diagnostic imaging of skull and head, not elsewhere classified: Secondary | ICD-10-CM

## 2016-05-17 DIAGNOSIS — F331 Major depressive disorder, recurrent, moderate: Secondary | ICD-10-CM

## 2016-05-17 NOTE — Patient Instructions (Addendum)
Medication Instructions:  Your physician recommends that you continue on your current medications as directed. Please refer to the Current Medication list given to you today.  Labwork: NONE  Testing/Procedures: Your physician has requested that you have an echocardiogram with bubble study. Echocardiography is a painless test that uses sound waves to create images of your heart. It provides your doctor with information about the size and shape of your heart and how well your heart's chambers and valves are working. This procedure takes approximately one hour. There are no restrictions for this procedure.  Your physician has requested that you have a MRI of the head.   Follow-Up: Your physician wants you to follow-up as needed with Dr. Johnsie Cancel.   You have been referred to neurology- their office will call you with an appointment.    If you need a refill on your cardiac medications before your next appointment, please call your pharmacy.

## 2016-05-22 ENCOUNTER — Encounter: Payer: Self-pay | Admitting: Gynecology

## 2016-05-22 ENCOUNTER — Ambulatory Visit (INDEPENDENT_AMBULATORY_CARE_PROVIDER_SITE_OTHER): Payer: Medicare Other | Admitting: Gynecology

## 2016-05-22 VITALS — BP 120/80 | Ht 61.5 in | Wt 160.0 lb

## 2016-05-22 DIAGNOSIS — K469 Unspecified abdominal hernia without obstruction or gangrene: Secondary | ICD-10-CM | POA: Diagnosis not present

## 2016-05-22 DIAGNOSIS — N8111 Cystocele, midline: Secondary | ICD-10-CM

## 2016-05-22 NOTE — Progress Notes (Signed)
   HPI: Patient is a 75 year old who was referred to our practice from her gastroenterologist Dr. Fuller Plan for further evaluation of possible long-standing questionable history of rectocele. Patient stated that several years ago she had a transvaginal hysterectomy and then several years after that at the [facility] Dr. Maryland Pink had done some form of sling procedure because of urinary incontinence. She feels pressure in her rectum and sometimes had difficulty having bowel movement but does not have to digitalize herself. She has very minimal incontinence at the present time. Her gastroenterologist as her on a 5 year recall because of colon polyps and is scheduled next week for the next colonoscopy. She was in no acute distress today.   ROS: A ROS was performed and pertinent positives and negatives are included in the history.  GENERAL: No fevers or chills. HEENT: No change in vision, no earache, sore throat or sinus congestion. NECK: No pain or stiffness. CARDIOVASCULAR: No chest pain or pressure. No palpitations. PULMONARY: No shortness of breath, cough or wheeze. GASTROINTESTINAL: See above GENITOURINARY: No urinary frequency, urgency, hesitancy or dysuria. MUSCULOSKELETAL: No joint or muscle pain, no back pain, no recent trauma. DERMATOLOGIC: No rash, no itching, no lesions. ENDOCRINE: No polyuria, polydipsia, no heat or cold intolerance. No recent change in weight. HEMATOLOGICAL: No anemia or easy bruising or bleeding. NEUROLOGIC: No headache, seizures, numbness, tingling or weakness. PSYCHIATRIC: No depression, no loss of interest in normal activity or change in sleep pattern.   PE: Blood pressure 120/80 Abdomen: Soft nontender no rebound or guarding Pelvic: External genitalia with atrophic changes Bartholin urethra Skene glands within normal limits Vagina: No gross lesions on inspection no cystocele rectocele seen vaginal cuff intact  Patient was examined on the  right position it was noted that she had a small rectocele high cystocele. No rectocele was seen.  Assessment/plan: Patient with enterocele and small high cystocele. I'm going to refer patient to Dr. Maryland Pink in consultation today since she had operated on this patient with a sling procedure several years ago. Patient is currently at [facility] we'll make arrangements for her to follow-up with her. I've shown patient several pictures shown her what the enterocele looks like an eye reassured her that she did not have a rectocele.  Greater than 50% of the time was spent counseling and coordinating care for this patient. Time of consultation 20 minutes

## 2016-05-23 ENCOUNTER — Telehealth: Payer: Self-pay | Admitting: *Deleted

## 2016-05-23 NOTE — Telephone Encounter (Signed)
-----   Message from Terrance Mass, MD sent at 05/22/2016  4:03 PM EDT ----- Anderson Malta, please make an appointment for this patient with Dr. Harold Hedge who was operated on by her several years ago. She is a uro- gynecologists who was at [facility] and is now at  [facility]. Patient had been operated on by her in the past. Patient with an enterocele partial cystocele and having discomfort.

## 2016-05-23 NOTE — Telephone Encounter (Signed)
Appointment on 06/21/16 @ 9:00am with Dr.Matthew notes faxed to [phone] (fax)  [phone](office) pt aware of time and date

## 2016-05-24 ENCOUNTER — Other Ambulatory Visit: Payer: Self-pay

## 2016-05-24 ENCOUNTER — Ambulatory Visit (HOSPITAL_COMMUNITY)
Admission: RE | Admit: 2016-05-24 | Discharge: 2016-05-24 | Disposition: A | Discharge status: Home / Self Care | Payer: Medicare Other | Source: Ambulatory Visit | Attending: Cardiovascular Disease | Admitting: Cardiovascular Disease

## 2016-05-24 ENCOUNTER — Ambulatory Visit (HOSPITAL_BASED_OUTPATIENT_CLINIC_OR_DEPARTMENT_OTHER): Payer: Medicare Other

## 2016-05-24 DIAGNOSIS — Z23 Encounter for immunization: Secondary | ICD-10-CM

## 2016-05-24 DIAGNOSIS — R413 Other amnesia: Secondary | ICD-10-CM | POA: Insufficient documentation

## 2016-05-24 DIAGNOSIS — F331 Major depressive disorder, recurrent, moderate: Secondary | ICD-10-CM

## 2016-05-24 DIAGNOSIS — Q2112 Patent foramen ovale: Secondary | ICD-10-CM

## 2016-05-24 DIAGNOSIS — R9089 Other abnormal findings on diagnostic imaging of central nervous system: Secondary | ICD-10-CM | POA: Insufficient documentation

## 2016-05-24 DIAGNOSIS — R930 Abnormal findings on diagnostic imaging of skull and head, not elsewhere classified: Secondary | ICD-10-CM | POA: Diagnosis not present

## 2016-05-24 DIAGNOSIS — I517 Cardiomegaly: Secondary | ICD-10-CM | POA: Diagnosis not present

## 2016-05-24 DIAGNOSIS — Q211 Atrial septal defect: Secondary | ICD-10-CM | POA: Diagnosis not present

## 2016-05-24 DIAGNOSIS — Z7689 Persons encountering health services in other specified circumstances: Secondary | ICD-10-CM | POA: Diagnosis not present

## 2016-05-24 HISTORY — PX: TRANSTHORACIC ECHOCARDIOGRAM: SHX275

## 2016-05-24 LAB — CREATININE, SERUM
Creatinine, Ser: 1.03 mg/dL — ABNORMAL HIGH (ref 0.44–1.00)
GFR, EST NON AFRICAN AMERICAN: 53 mL/min — AB (ref >60)

## 2016-05-24 MED ORDER — GADOBENATE DIMEGLUMINE 529 MG/ML IV SOLN
15.0000 mL | Freq: Once | INTRAVENOUS | Status: AC
  Administered 2016-05-24: 15 mL via INTRAVENOUS

## 2016-05-25 ENCOUNTER — Encounter: Payer: Self-pay | Admitting: Family Medicine

## 2016-05-25 ENCOUNTER — Telehealth: Payer: Self-pay | Admitting: Cardiovascular Disease

## 2016-05-25 NOTE — Telephone Encounter (Signed)
Notified the pt of her echo with bubble study results, that was ordered and read by Dr Johnsie Cancel.  Went over this result thoroughly with the pt.  Pt verbalized understanding.   Study Conclusions noted by Dr Johnsie Cancel:  - Left ventricle: The cavity size was normal. There was mild focal   basal hypertrophy of the septum. Systolic function was normal.   The estimated ejection fraction was in the range of 60% to 65%.   Wall motion was normal; there were no regional wall motion   abnormalities. Left ventricular diastolic function parameters   were normal. - Atrial septum: No defect or patent foramen ovale was identified.   Echo contrast study showed no right-to-left atrial level shunt,   at baseline or with provocation.

## 2016-05-25 NOTE — Telephone Encounter (Signed)
Mrs. Tikkanen returned a call . Please call

## 2016-05-29 ENCOUNTER — Encounter: Payer: Self-pay | Admitting: Gastroenterology

## 2016-05-29 ENCOUNTER — Ambulatory Visit (AMBULATORY_SURGERY_CENTER): Payer: Medicare Other | Admitting: Gastroenterology

## 2016-05-29 VITALS — BP 134/78 | HR 76 | Temp 98.6°F | Resp 18 | Ht 61.5 in | Wt 161.0 lb

## 2016-05-29 DIAGNOSIS — Z86010 Personal history of colonic polyps: Secondary | ICD-10-CM | POA: Diagnosis not present

## 2016-05-29 DIAGNOSIS — Z1211 Encounter for screening for malignant neoplasm of colon: Secondary | ICD-10-CM | POA: Diagnosis not present

## 2016-05-29 MED ORDER — SODIUM CHLORIDE 0.9 % IV SOLN: 500.0000 mL | INTRAVENOUS | Status: DC

## 2016-05-29 NOTE — Op Note (Signed)
[facility] Patient Name: Carla Spencer Procedure Date: 05/29/2016 2:05 PM MRN: [id number] Endoscopist: Ladene Artist , MD Age: 75 Referring MD:  Date of Birth: May 04, 1941 Gender: Female Account #: [id number] Procedure:                Colonoscopy Indications:              Surveillance: Personal history of adenomatous                            polyps on last colonoscopy > 5 years ago Medicines:                Monitored Anesthesia Care Procedure:                Pre-Anesthesia Assessment:                           - Prior to the procedure, a History and Physical                            was performed, and patient medications and                            allergies were reviewed. The patient's tolerance of                            previous anesthesia was also reviewed. The risks                            and benefits of the procedure and the sedation                            options and risks were discussed with the patient.                            All questions were answered, and informed consent                            was obtained. Prior Anticoagulants: The patient has                            taken Plavix (clopidogrel), last dose was 5 days                            prior to procedure. ASA Grade Assessment: III - A                            patient with severe systemic disease. After                            reviewing the risks and benefits, the patient was                            deemed in satisfactory condition to undergo the  procedure.                           After obtaining informed consent, the colonoscope                            was passed under direct vision. Throughout the                            procedure, the patient's blood pressure, pulse, and                            oxygen saturations were monitored continuously. The                            Model PCF-H190DL [id number]) scope was introduced                        through the anus and advanced to the the cecum,                            identified by appendiceal orifice and ileocecal                            valve. The ileocecal valve, appendiceal orifice,                            and rectum were photographed. The quality of the                            bowel preparation was good. The patient tolerated                            the procedure well. The colonoscopy was somewhat                            difficult due to a tortuous colon. Scope In: 2:09:53 PM Scope Out: 2:25:23 PM Scope Withdrawal Time: 0 hours 9 minutes 37 seconds  Total Procedure Duration: 0 hours 15 minutes 30 seconds  Findings:                 The perianal and digital rectal examinations were                            normal.                           Internal hemorrhoids were found during                            retroflexion. The hemorrhoids were small and Grade                            I (internal hemorrhoids that do not prolapse).  Multiple medium-mouthed diverticula were found in                            the sigmoid colon and descending colon. There was                            narrowing of the colon in association with the                            diverticular opening. There was evidence of                            diverticular spasm. There was no evidence of                            diverticular bleeding.                           The exam was otherwise without abnormality on                            direct and retroflexion views. Complications:            No immediate complications. Estimated blood loss:                            None. Estimated Blood Loss:     Estimated blood loss: none. Impression:               - Internal hemorrhoids.                           - Moderate diverticulosis in the sigmoid colon and                            in the descending colon.                           - The  examination was otherwise normal on direct                            and retroflexion views.                           - No specimens collected. Recommendation:           - Repeat colonoscopy in 5 years for surveillance.                           - Resume Plavix (clopidogrel) today at prior dose.                            Refer to managing physician for further adjustment                            of therapy.                           -  Patient has a contact number available for                            emergencies. The signs and symptoms of potential                            delayed complications were discussed with the                            patient. Return to normal activities tomorrow.                            Written discharge instructions were provided to the                            patient.                           - High fiber diet.                           - Continue present medications. Ladene Artist, MD 05/29/2016 2:29:04 PM This report has been signed electronically.

## 2016-05-29 NOTE — Progress Notes (Signed)
Pt c/o abdominal, gas pain while in the RR.  I turned her several time and HOB placed down.  She is able to pass air.  Abdomen soft, easily palpable.  She started out with a pain of "7".  I did ambulate her to the bathroom where she did pass air.  Pain about a "2-3" before discharge.  I told her to ambulate when she gets home and to drink warm fluids if cramping persists.  Understanding voiced

## 2016-05-29 NOTE — Progress Notes (Signed)
A and O x3. Report to RN. Tolerated MAC anesthesia well. 

## 2016-05-29 NOTE — Patient Instructions (Signed)
YOU HAD AN ENDOSCOPIC PROCEDURE TODAY AT [facility]:   Refer to the procedure report that was given to you for any specific questions about what was found during the examination.  If the procedure report does not answer your questions, please call your gastroenterologist to clarify.  If you requested that your care partner not be given the details of your procedure findings, then the procedure report has been included in a sealed envelope for you to review at your convenience later.  YOU SHOULD EXPECT: Some feelings of bloating in the abdomen. Passage of more gas than usual.  Walking can help get rid of the air that was put into your GI tract during the procedure and reduce the bloating. If you had a lower endoscopy (such as a colonoscopy or flexible sigmoidoscopy) you may notice spotting of blood in your stool or on the toilet paper. If you underwent a bowel prep for your procedure, you may not have a normal bowel movement for a few days.  Please Note:  You might notice some irritation and congestion in your nose or some drainage.  This is from the oxygen used during your procedure.  There is no need for concern and it should clear up in a day or so.  SYMPTOMS TO REPORT IMMEDIATELY:   Following lower endoscopy (colonoscopy or flexible sigmoidoscopy):  Excessive amounts of blood in the stool  Significant tenderness or worsening of abdominal pains  Swelling of the abdomen that is new, acute  Fever of 100F or higher  For urgent or emergent issues, a gastroenterologist can be reached at any hour by calling [phone].   DIET:  We do recommend a small meal at first, but then you may proceed to your regular diet.  Drink plenty of fluids but you should avoid alcoholic beverages for 24 hours.  ACTIVITY:  You should plan to take it easy for the rest of today and you should NOT DRIVE or use heavy machinery until tomorrow (because of the sedation medicines used during the test).     FOLLOW UP: Our staff will call the number listed on your records the next business day following your procedure to check on you and address any questions or concerns that you may have regarding the information given to you following your procedure. If we do not reach you, we will leave a message.  However, if you are feeling well and you are not experiencing any problems, there is no need to return our call.  We will assume that you have returned to your regular daily activities without incident.   SIGNATURES/CONFIDENTIALITY: You and/or your care partner have signed paperwork which will be entered into your electronic medical record.  These signatures attest to the fact that that the information above on your After Visit Summary has been reviewed and is understood.  Full responsibility of the confidentiality of this discharge information lies with you and/or your care-partner.  Next colonoscopy- 5 years  Please read over handouts about hemorrhoids, diverticulosis and internal hemorrhoids  Resume Plavix today, along with other medications

## 2016-05-30 ENCOUNTER — Telehealth: Payer: Self-pay

## 2016-05-30 NOTE — Telephone Encounter (Signed)
  Follow up Call-  Call back number 05/29/2016  Post procedure Call Back phone  # [phone]  Permission to leave phone message Yes  Some recent data might be hidden     Patient questions:  Do you have a fever, pain , or abdominal swelling? No. Pain Score  0 *  Have you tolerated food without any problems? Yes.    Have you been able to return to your normal activities? Yes.    Do you have any questions about your discharge instructions: Diet   No. Medications  No. Follow up visit  No.  Do you have questions or concerns about your Care? No.  Actions: * If pain score is 4 or above: No action needed, pain <4.

## 2016-06-05 ENCOUNTER — Encounter: Payer: Self-pay | Admitting: Family Medicine

## 2016-06-08 DIAGNOSIS — D2312 Other benign neoplasm of skin of left eyelid, including canthus: Secondary | ICD-10-CM | POA: Diagnosis not present

## 2016-06-08 DIAGNOSIS — H524 Presbyopia: Secondary | ICD-10-CM | POA: Diagnosis not present

## 2016-06-08 DIAGNOSIS — H2513 Age-related nuclear cataract, bilateral: Secondary | ICD-10-CM | POA: Diagnosis not present

## 2016-06-08 DIAGNOSIS — H5203 Hypermetropia, bilateral: Secondary | ICD-10-CM | POA: Diagnosis not present

## 2016-06-08 DIAGNOSIS — H52203 Unspecified astigmatism, bilateral: Secondary | ICD-10-CM | POA: Diagnosis not present

## 2016-06-08 DIAGNOSIS — H43393 Other vitreous opacities, bilateral: Secondary | ICD-10-CM | POA: Diagnosis not present

## 2016-06-08 DIAGNOSIS — D2311 Other benign neoplasm of skin of right eyelid, including canthus: Secondary | ICD-10-CM | POA: Diagnosis not present

## 2016-06-21 ENCOUNTER — Encounter: Payer: Self-pay | Admitting: Family Medicine

## 2016-06-21 ENCOUNTER — Ambulatory Visit (INDEPENDENT_AMBULATORY_CARE_PROVIDER_SITE_OTHER): Payer: Medicare Other | Admitting: Family Medicine

## 2016-06-21 VITALS — BP 146/84 | HR 67 | Temp 99.4°F | Resp 16 | Wt 160.4 lb

## 2016-06-21 DIAGNOSIS — Z131 Encounter for screening for diabetes mellitus: Secondary | ICD-10-CM | POA: Diagnosis not present

## 2016-06-21 DIAGNOSIS — I10 Essential (primary) hypertension: Secondary | ICD-10-CM

## 2016-06-21 DIAGNOSIS — F329 Major depressive disorder, single episode, unspecified: Secondary | ICD-10-CM

## 2016-06-21 DIAGNOSIS — N2889 Other specified disorders of kidney and ureter: Secondary | ICD-10-CM

## 2016-06-21 DIAGNOSIS — F419 Anxiety disorder, unspecified: Principal | ICD-10-CM

## 2016-06-21 DIAGNOSIS — E7800 Pure hypercholesterolemia, unspecified: Secondary | ICD-10-CM

## 2016-06-21 DIAGNOSIS — F418 Other specified anxiety disorders: Secondary | ICD-10-CM

## 2016-06-21 DIAGNOSIS — F32A Depression, unspecified: Secondary | ICD-10-CM

## 2016-06-21 DIAGNOSIS — F409 Phobic anxiety disorder, unspecified: Secondary | ICD-10-CM

## 2016-06-21 DIAGNOSIS — F5105 Insomnia due to other mental disorder: Secondary | ICD-10-CM

## 2016-06-21 DIAGNOSIS — N182 Chronic kidney disease, stage 2 (mild): Secondary | ICD-10-CM

## 2016-06-21 LAB — COMPREHENSIVE METABOLIC PANEL
ALBUMIN: 4.6 g/dL (ref 3.5–5.2)
ALT: 17 U/L (ref 0–35)
AST: 15 U/L (ref 0–37)
Alkaline Phosphatase: 91 U/L (ref 39–117)
BUN: 16 mg/dL (ref 6–23)
CHLORIDE: 105 meq/L (ref 96–112)
CO2: 29 mEq/L (ref 19–32)
CREATININE: 0.87 mg/dL (ref 0.40–1.20)
Calcium: 10.5 mg/dL (ref 8.4–10.5)
GFR: 67.85 mL/min (ref >60.00)
GLUCOSE: 76 mg/dL (ref 70–99)
Potassium: 5.1 mEq/L (ref 3.5–5.1)
SODIUM: 142 meq/L (ref 135–145)
TOTAL PROTEIN: 6.8 g/dL (ref 6.0–8.3)
Total Bilirubin: 0.8 mg/dL (ref 0.2–1.2)

## 2016-06-21 MED ORDER — ASPIRIN EC 81 MG PO TBEC: 81.0000 mg | DELAYED_RELEASE_TABLET | Freq: Every day | ORAL | 0 refills | Status: AC

## 2016-06-21 MED ORDER — BUSPIRONE HCL 10 MG PO TABS: 10.0000 mg | ORAL_TABLET | Freq: Three times a day (TID) | ORAL | 1 refills | Status: DC

## 2016-06-21 NOTE — Patient Instructions (Signed)
You may get your bone density testing done at [facility] on [address]--I have ordered this for you.  Just ask our front office staff to help you schedule this.

## 2016-06-21 NOTE — Progress Notes (Signed)
Pre visit review using our clinic review tool, if applicable. No additional management support is needed unless otherwise documented below in the visit note. 

## 2016-06-21 NOTE — Progress Notes (Signed)
OFFICE VISIT  06/21/2016   CC:  Chief Complaint  Patient presents with  . Follow-up    HTN, Hypothyroidism, insomnia   HPI:    Patient is a 75 y.o. Caucasian female who presents accompanied by her husband for 4 mo f/u anx/dep, HTN, HLD, CRI stage II. Her hypothyroidism is managed/followed by Dr. Chalmers Cater in endo.  Since I last saw her she saw Dr. Johnsie Cancel in cardiology.  He repeated her echo and it showed no ASD or PFO. We discussed getting/staying off plavix but she can start ASA 81mg  qd. He also referred her to neurology for neuropsych testing.  Pt considering cancelling this appt due to long hx of having talked this all out and she is wondering what good it will do.    I ordered DEXA 02/2016 for her at her medicare AWV but she has not gotten this test done yet.  HTN: most checks at home are normal.  Sleep: still initiates sleep fine but after approx 4-5 hours she awakens and her mind is full of anxious thoughts and she cannot go back to sleep.  Still fighting depression and anxiety, says she forces herself to do things throughout the day.  Effexor XR 75mg  qd is where she wants to stay.  We discussed adding buspar today.  HLD: taking statin w/out side effect.  Past Medical History:  Diagnosis Date  . Anxiety and depression    with PTSD.  Dr. Johnsie Cancel ordered repeat MRI brain and referred pt to neuro for neuropsych testing as of 05/17/16 office f/u.  Marland Kitchen Arthritis   . Bilateral carpal tunnel syndrome   . Cataracts, bilateral   . Chronic renal insufficiency, stage II (mild) 2015   CrCl about 60 ml/min  . Colitis    hx of  . DDD (degenerative disc disease), lumbar 2016   L1-2 and L5-S1 fairly severe--intramuscular injection of steroid and toradol by Dr. Antonietta Jewel 02/24/14 to calm down the pain  . Enterocele 05/2016   Dx'd by Dr. Toney Rakes (GYN); he referred pt back to the MD at [facility] that did her prior bladder sling procedure.  No rectocele.  Marland Kitchen GERD (gastroesophageal reflux disease)   .  Goiter    Lobectomy (left) of thyroid for benign nodule.  Right lobe nodules followed by Dr. Chalmers Cater (FNA 2012 benign goiter) with ultrasounds and have been stable, most recently 01/10/13.    . H/O adenomatous polyp of colon 2012; 05/2016   5 yr recall  . Hyperlipidemia   . Hypertension   . Lumbar spondylolysis 2016   L5: with grade I spondylolisthesis L5 on S1  . Osteopenia 2012  . PFO (patent foramen ovale) echo 03/21/05   "hole in heart" saw Dr. Sharee Holster [phone] [facility]. hospital.  Plavix med mgmt.  Dr. Johnsie Cancel repeated echo w/bubble study 05/2016 and it showed NO PFO or ASD.  Marland Kitchen Subclinical hypothyroidism    Dr. Chalmers Cater has her on 25 mcg synthroid qd as of 01/2014  . TIA (transient ischemic attack)    patient on plavix,  saw Dr. Isabell Jarvis at [facility].hospital in [place].  MRI brain 05/2016: mild chronic small vessel dz, o/w normal.  . Urinary tract infection    hx of  . Vertigo     Past Surgical History:  Procedure Laterality Date  . ABDOMINAL HYSTERECTOMY    . BLADDER SUSPENSION    . BREAST SURGERY     "breast lumps removed"  . COLONOSCOPY W/ POLYPECTOMY  04/2011; 05/29/16   +Diverticulosis and int  hem.  Adenomatous polyp 2012.  No polyps on repeat TCS 05/2016--recall 5 yrs (05/2021)  . DEXA  05/23/11   T-score -2.2  . DILATION AND CURETTAGE OF UTERUS    . diverticulosis  05/2016   Noted on colonoscopy  . RECTOCELE REPAIR     purse string  . THYROIDECTOMY     Left lobectomy  . TOTAL KNEE ARTHROPLASTY  07/26/2012   Procedure: TOTAL KNEE ARTHROPLASTY;  Surgeon: Yvette Rack., MD;  Location: [place];  Service: Orthopedics;  Laterality: Right;  . TRANSTHORACIC ECHOCARDIOGRAM  05/24/2016   EF 60-65%, normal LV function.  No DD. No PFO or ASD (bubble study was done).    Outpatient Medications Prior to Visit  Medication Sig Dispense Refill  . amLODipine (NORVASC) 5 MG tablet TAKE ONE TABLET BY MOUTH ONCE DAILY 90 tablet 3  . atorvastatin (LIPITOR) 20 MG tablet TAKE ONE  TABLET BY MOUTH ONCE DAILY 90 tablet 1  . levothyroxine (SYNTHROID, LEVOTHROID) 50 MCG tablet Take 1 tablet (50 mcg total) by mouth daily before breakfast. 30 tablet 6  . losartan (COZAAR) 100 MG tablet TAKE 1 TABLET BY MOUTH EVERY DAY 90 tablet 1  . Multiple Vitamin (MV-ONE) CAPS Take 1 capsule by mouth daily.    . Omega-3 Fatty Acids (FISH OIL) 500 MG CAPS Take 1 capsule by mouth daily.    Marland Kitchen venlafaxine XR (EFFEXOR-XR) 75 MG 24 hr capsule Take 75 mg by mouth daily.    . clopidogrel (PLAVIX) 75 MG tablet TAKE ONE TABLET BY MOUTH ONCE DAILY 90 tablet 3   Facility-Administered Medications Prior to Visit  Medication Dose Route Frequency Provider Last Rate Last Dose  . 0.9 %  sodium chloride infusion  500 mL Intravenous Continuous Ladene Artist, MD        Allergies  Allergen Reactions  . Sulfa Drugs Cross Reactors Rash    ROS As per HPI  PE: Blood pressure (!) 146/84, pulse 67, temperature 99.4 F (37.4 C), temperature source Oral, resp. rate 16, weight 160 lb 6.4 oz (72.8 kg), SpO2 98 %. Gen: Alert, well appearing.  Patient is oriented to person, place, time, and situation. Affect: anxious but pleasant CV: RRR, no m/r/g.   LUNGS: CTA bilat, nonlabored resps, good aeration in all lung fields. EXT: no clubbing, cyanosis, or edema.    LABS:  Lab Results  Component Value Date   TSH 2.50 09/23/2012   Lab Results  Component Value Date   WBC 7.4 12/24/2013   HGB 14.7 12/24/2013   HCT 43.6 12/24/2013   MCV 89.9 12/24/2013   PLT 268.0 12/24/2013   Lab Results  Component Value Date   CREATININE 1.03 (H) 05/24/2016   BUN 15 07/02/2015   NA 142 07/02/2015   K 5.0 07/02/2015   CL 107 07/02/2015   CO2 27 07/02/2015   Lab Results  Component Value Date   ALT 12 04/27/2015   AST 11 04/27/2015   ALKPHOS 74 04/27/2015   BILITOT 0.5 04/27/2015   Lab Results  Component Value Date   CHOL 193 09/22/2015   Lab Results  Component Value Date   HDL 55.40 09/22/2015   Lab  Results  Component Value Date   LDLCALC 107 (H) 09/22/2015   Lab Results  Component Value Date   TRIG 153.0 (H) 09/22/2015   Lab Results  Component Value Date   CHOLHDL 3 09/22/2015   IMPRESSION AND PLAN:  1) Anxiety/dep/PTSD: she is holding pretty steady lately.  As usual  she resists much new med tx or counseling. We decided to do a trial of buspar: will initiate at 10mg  tid dosing and see how she is doing in 1 month. Continue effexor xr 75mg  qd.  2) HTN: The current medical regimen is effective;  continue present plan and medications. Lytes/cr today.  3) HLD: tolerating statin.  Next FLP due after 09/2016. Checking AST/ALT today.  4) CRI II (GFR about 60): checking lytes/cr today.  5) Hx of PFO and question of microvascular embolic events/TIAs in remote past. Recent MRI brain showed no focal/acute findings.   Most recent echo showed NO ASD or PFO. I told her to d/c plavix.  I think, with her RF's of HTN and Hyperlipidemia plus her age, that it is reasonable to have her on 81mg  ASA for primary stroke prevention.  6) Insomnia: related to her anx/dep: initiates sleep ok. We'll see how buspar helps with this. I'll consider future trial of prn clonidine 0.1mg  to use upon early morning awakening in future if buspar unhelpful.  7) Diabetes screening (annual): check fasting serum glucose with labs today.  An After Visit Summary was printed and given to the patient.  FOLLOW UP: Return in about 4 weeks (around 07/19/2016) for f/u anx/dep/insom.  Signed:  Crissie Sickles, MD           06/21/2016

## 2016-07-13 ENCOUNTER — Telehealth: Payer: Self-pay | Admitting: Family Medicine

## 2016-07-13 MED ORDER — VENLAFAXINE HCL ER 75 MG PO CP24: 75.0000 mg | ORAL_CAPSULE | Freq: Every day | ORAL | 3 refills | Status: DC

## 2016-07-13 NOTE — Telephone Encounter (Signed)
Venlafaxine Rx 90 day to [facility]

## 2016-07-13 NOTE — Telephone Encounter (Signed)
RF request for venlafazine (90 day supply) LOV: 06/21/16 Next ov: 07/20/16 Last written: 02/18/16 #30 w/ 6RF

## 2016-07-13 NOTE — Telephone Encounter (Signed)
Rx sent.  Pt advised and voiced understanding.   

## 2016-07-19 ENCOUNTER — Ambulatory Visit: Payer: Medicare Other | Admitting: Neurology

## 2016-07-20 ENCOUNTER — Encounter: Payer: Self-pay | Admitting: Family Medicine

## 2016-07-20 ENCOUNTER — Ambulatory Visit (INDEPENDENT_AMBULATORY_CARE_PROVIDER_SITE_OTHER): Payer: Medicare Other | Admitting: Family Medicine

## 2016-07-20 VITALS — BP 120/79 | HR 71 | Temp 98.6°F | Resp 16 | Ht 61.5 in | Wt 165.0 lb

## 2016-07-20 DIAGNOSIS — F329 Major depressive disorder, single episode, unspecified: Secondary | ICD-10-CM

## 2016-07-20 DIAGNOSIS — F418 Other specified anxiety disorders: Secondary | ICD-10-CM

## 2016-07-20 DIAGNOSIS — F99 Mental disorder, not otherwise specified: Secondary | ICD-10-CM

## 2016-07-20 DIAGNOSIS — F5105 Insomnia due to other mental disorder: Secondary | ICD-10-CM | POA: Diagnosis not present

## 2016-07-20 DIAGNOSIS — F419 Anxiety disorder, unspecified: Principal | ICD-10-CM

## 2016-07-20 MED ORDER — BUSPIRONE HCL 10 MG PO TABS: 10.0000 mg | ORAL_TABLET | Freq: Two times a day (BID) | ORAL | 0 refills | Status: DC

## 2016-07-20 MED ORDER — ATORVASTATIN CALCIUM 20 MG PO TABS: 20.0000 mg | ORAL_TABLET | Freq: Every day | ORAL | 0 refills | Status: DC

## 2016-07-20 NOTE — Progress Notes (Signed)
OFFICE VISIT  07/20/2016   CC:  Chief Complaint  Patient presents with  . Follow-up    Anxeity, Depression, and Insomnia   HPI:    Patient is a 75 y.o. Caucasian female who presents for 1 month f/u anxiety and depression. Last visit I started her on buspar 10mg  tid. I also rx'd clonidine 0.1mg  for her to try using prn for maintenance insomnia.  She is taking the buspar: tid dosing made her feel a bit overmedicated.  Changed to bid dosing and feels improved, more calm, husband notices this as well.  Her sleep has been improved as well on this medication. Mood is stable.  She did not get the clonidine in order to try it for her middle/maintenance insomnia, but this problem seems better lately anyway.  She did end up cancelling her neurologist appt that she was going to get neurocognitive testing at.   Past Medical History:  Diagnosis Date  . Anxiety and depression    with PTSD.  Dr. Johnsie Cancel ordered repeat MRI brain and referred pt to neuro for neuropsych testing as of 05/17/16 office f/u.  Marland Kitchen Arthritis   . Bilateral carpal tunnel syndrome   . Cataracts, bilateral   . Chronic renal insufficiency, stage II (mild) 2015   CrCl about 60 ml/min  . Colitis    hx of  . DDD (degenerative disc disease), lumbar 2016   L1-2 and L5-S1 fairly severe--intramuscular injection of steroid and toradol by Dr. Antonietta Jewel 02/24/14 to calm down the pain  . Enterocele 05/2016   Dx'd by Dr. Toney Rakes (GYN); he referred pt back to the MD at [facility] that did her prior bladder sling procedure.  No rectocele.  Marland Kitchen GERD (gastroesophageal reflux disease)   . Goiter    Lobectomy (left) of thyroid for benign nodule.  Right lobe nodules followed by Dr. Chalmers Cater (FNA 2012 benign goiter) with ultrasounds and have been stable, most recently 01/10/13.    . H/O adenomatous polyp of colon 2012; 05/2016   5 yr recall  . Hyperlipidemia   . Hypertension   . Lumbar spondylolysis 2016   L5: with grade I spondylolisthesis L5 on S1   . Osteopenia 2012  . PFO (patent foramen ovale) echo 03/21/05   "hole in heart" saw Dr. Sharee Holster [phone] [facility]. hospital.  Plavix med mgmt.  Dr. Johnsie Cancel repeated echo w/bubble study 05/2016 and it showed NO PFO or ASD.  Marland Kitchen Subclinical hypothyroidism    Dr. Chalmers Cater has her on 25 mcg synthroid qd as of 01/2014  . TIA (transient ischemic attack)    patient on plavix,  saw Dr. Isabell Jarvis at [facility]hospital in [place].  MRI brain 05/2016: mild chronic small vessel dz, o/w normal.  . Urinary tract infection    hx of  . Vertigo     Past Surgical History:  Procedure Laterality Date  . ABDOMINAL HYSTERECTOMY    . BLADDER SUSPENSION    . BREAST SURGERY     "breast lumps removed"  . COLONOSCOPY W/ POLYPECTOMY  04/2011; 05/29/16   +Diverticulosis and int hem.  Adenomatous polyp 2012.  No polyps on repeat TCS 05/2016--recall 5 yrs (05/2021)  . DEXA  05/23/11   T-score -2.2  . DILATION AND CURETTAGE OF UTERUS    . diverticulosis  05/2016   Noted on colonoscopy  . RECTOCELE REPAIR     purse string  . THYROIDECTOMY     Left lobectomy  . TOTAL KNEE ARTHROPLASTY  07/26/2012   Procedure: TOTAL KNEE ARTHROPLASTY;  Surgeon: Yvette Rack., MD;  Location: [place];  Service: Orthopedics;  Laterality: Right;  . TRANSTHORACIC ECHOCARDIOGRAM  05/24/2016   EF 60-65%, normal LV function.  No DD. No PFO or ASD (bubble study was done).    Outpatient Medications Prior to Visit  Medication Sig Dispense Refill  . amLODipine (NORVASC) 5 MG tablet TAKE ONE TABLET BY MOUTH ONCE DAILY 90 tablet 3  . aspirin EC 81 MG tablet Take 1 tablet (81 mg total) by mouth daily. 30 tablet 0  . levothyroxine (SYNTHROID, LEVOTHROID) 50 MCG tablet Take 1 tablet (50 mcg total) by mouth daily before breakfast. 30 tablet 6  . losartan (COZAAR) 100 MG tablet TAKE 1 TABLET BY MOUTH EVERY DAY 90 tablet 1  . Multiple Vitamin (MV-ONE) CAPS Take 1 capsule by mouth daily.    . Omega-3 Fatty Acids (FISH OIL) 500 MG CAPS Take 1  capsule by mouth daily.    Marland Kitchen venlafaxine XR (EFFEXOR-XR) 75 MG 24 hr capsule Take 1 capsule (75 mg total) by mouth daily with breakfast. 90 capsule 3  . atorvastatin (LIPITOR) 20 MG tablet TAKE ONE TABLET BY MOUTH ONCE DAILY 90 tablet 1  . busPIRone (BUSPAR) 10 MG tablet Take 1 tablet (10 mg total) by mouth 3 (three) times daily. (Patient taking differently: Take 10 mg by mouth 2 (two) times daily. ) 90 tablet 1   Facility-Administered Medications Prior to Visit  Medication Dose Route Frequency Provider Last Rate Last Dose  . 0.9 %  sodium chloride infusion  500 mL Intravenous Continuous Ladene Artist, MD        Allergies  Allergen Reactions  . Sulfa Drugs Cross Reactors Rash    ROS As per HPI  PE: Blood pressure 120/79, pulse 71, temperature 98.6 F (37 C), temperature source Oral, resp. rate 16, height 5' 1.5" (1.562 m), weight 165 lb (74.8 kg), SpO2 96 %. Gen: Alert, well appearing.  Patient is oriented to person, place, time, and situation. AFFECT: pleasant, lucid thought and speech. CV: RRR, no m/r/g.   LUNGS: CTA bilat, nonlabored resps, good aeration in all lung fields.  LABS:  None today  IMPRESSION AND PLAN:  1) Anxiety and depression: nice to see some improvement in her since being on buspar. Continue with 10mg  bid dosing.  Keep effexor xr at 75mg  qd.  2) Maintenance insomnia: seems to be better lately associated with better anxiety control. She will continue melatonin.  She never tried the prn clonidine and that is fine.  She and her husband are traveling in their RV to [place] to visit family for a couple months.  An After Visit Summary was printed and given to the patient.  FOLLOW UP: Return for 4-6 mo routine f/u anxiety and hyperlipidemia (fasting).  Signed:  Crissie Sickles, MD           07/20/2016

## 2016-07-20 NOTE — Progress Notes (Signed)
Pre visit review using our clinic review tool, if applicable. No additional management support is needed unless otherwise documented below in the visit note. 

## 2016-08-16 ENCOUNTER — Other Ambulatory Visit: Payer: Self-pay | Admitting: *Deleted

## 2016-08-16 MED ORDER — LOSARTAN POTASSIUM 100 MG PO TABS: ORAL_TABLET | ORAL | 1 refills | Status: DC

## 2016-08-16 NOTE — Telephone Encounter (Signed)
Fax from [place], [place] requesting Rx for losartan.  RF request for losartan LOV: 06/21/16 Next ov: 01/18/17 Last written: 12/27/15 #90 w/ 1RF

## 2016-10-17 ENCOUNTER — Other Ambulatory Visit: Payer: Self-pay | Admitting: Family Medicine

## 2016-11-18 IMAGING — MG MM SCREENING BREAST TOMO BILATERAL
8 series · 8 of 24 positions shown · non-contrast
Comparison: Previous exam(s).

CLINICAL DATA: Screening.

EXAM:
DIGITAL SCREENING BILATERAL MAMMOGRAM WITH 3D TOMO WITH CAD

[R MLO]
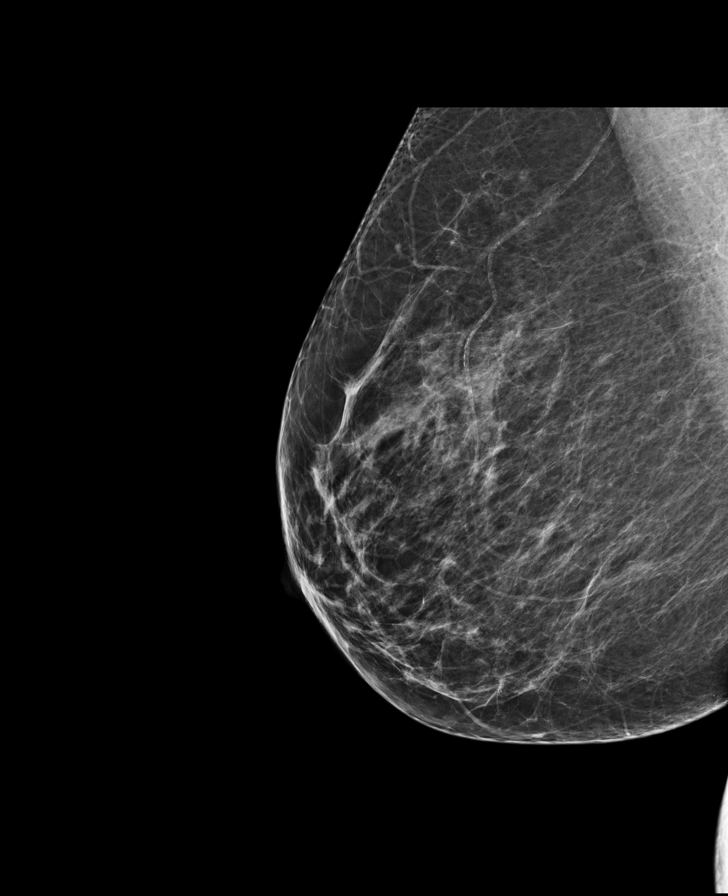

[L MLO]
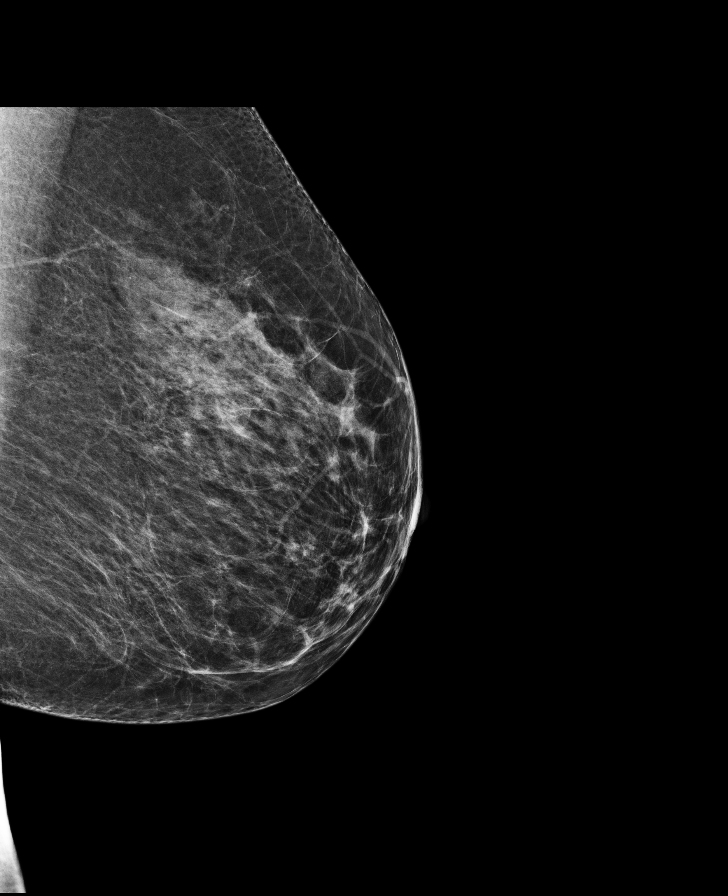

[L CC]
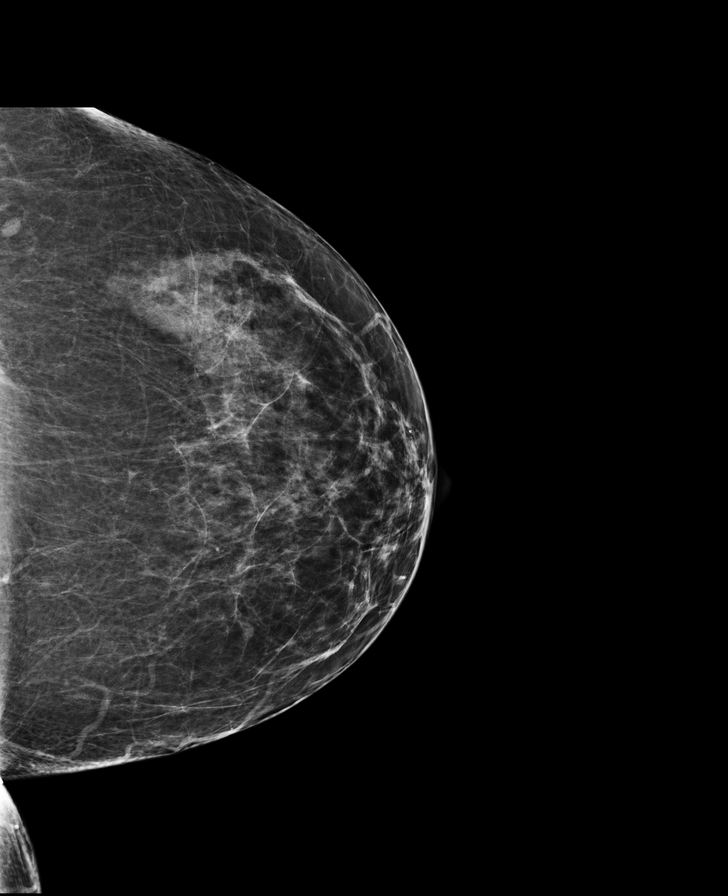

[R CC]
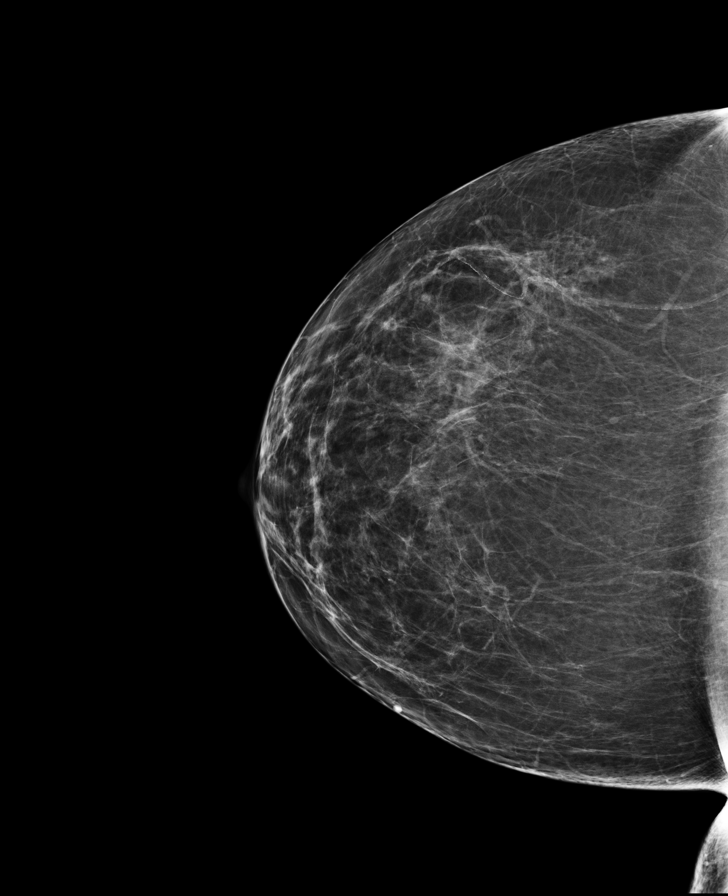

[R MLO tomo · tomo slice 33/66.0]
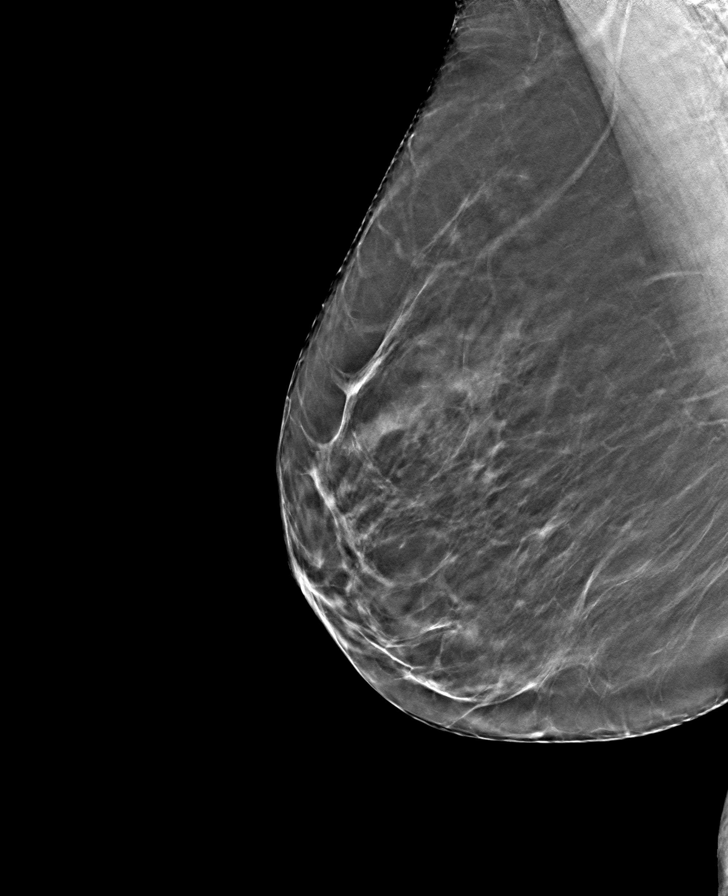

[R CC tomo · tomo slice 37/72.0]
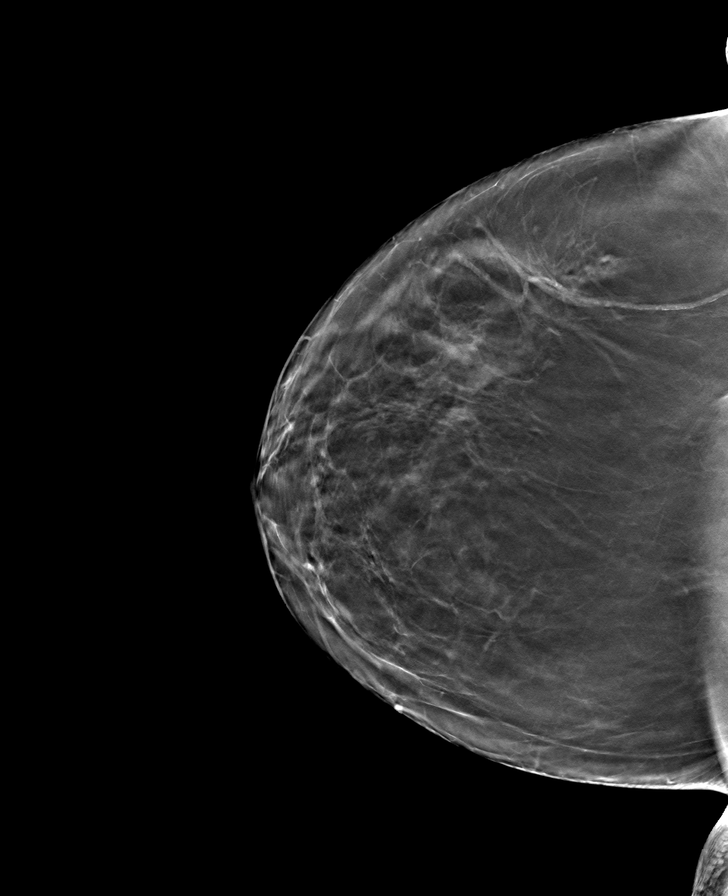

[L MLO tomo · tomo slice 33/64.0]
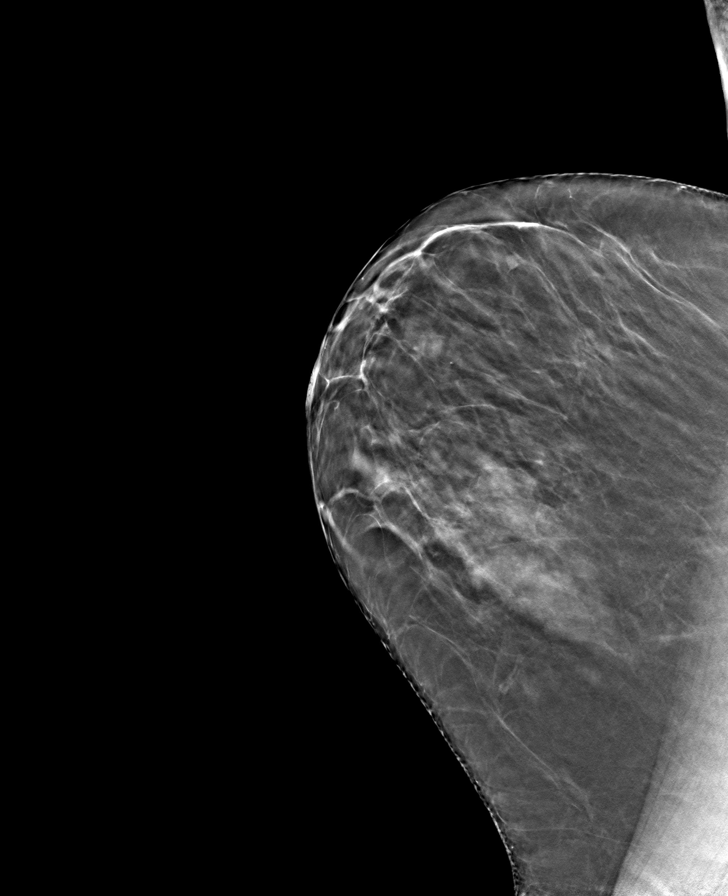

[L CC tomo · tomo slice 33/65.0]
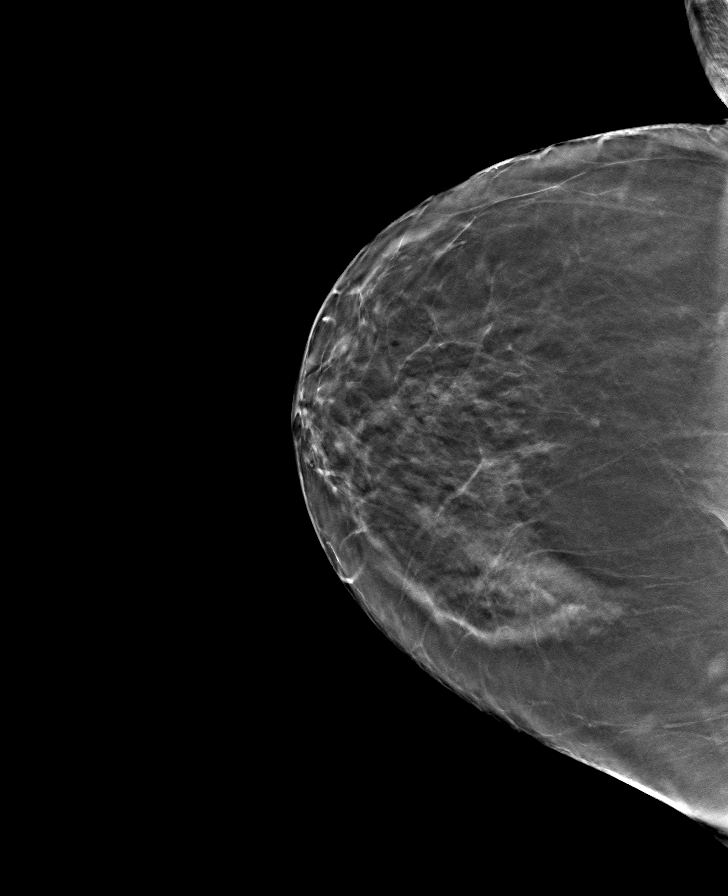

[8 of 24 positions shown; findings below may reference images not displayed]

ACR Breast Density Category b: There are scattered areas of
fibroglandular density.
FINDINGS: There are no findings suspicious for malignancy. Images were
processed with CAD.
IMPRESSION: No mammographic evidence of malignancy. A result letter of this
screening mammogram will be mailed directly to the patient.

RECOMMENDATION:
Screening mammogram in one year. (Code:55-L-23V)

BI-RADS CATEGORY  1: Negative.

## 2016-11-20 ENCOUNTER — Encounter: Payer: Self-pay | Admitting: Family Medicine

## 2016-11-20 NOTE — Telephone Encounter (Signed)
Please advise. Thanks.  

## 2016-11-21 NOTE — Telephone Encounter (Signed)
OK to take this at 400-600 mg dose a maximum of 3 times per day. Avoid use of over the counter anti-inflammatory meds such as ibuprofen or aleve when you are taking turmeric b/c this could increase risk of gastrointestinal bleeding. If over the counter med needed for pain, use tylenol as instructed on the packaging.

## 2016-11-23 ENCOUNTER — Other Ambulatory Visit: Payer: Self-pay | Admitting: Family Medicine

## 2016-11-23 NOTE — Telephone Encounter (Signed)
[place].  RF request for atorvastatin LOV: 06/21/16 Next ov: 01/18/17 Last written: 07/20/16 #90 w/ 0RF

## 2016-12-25 ENCOUNTER — Other Ambulatory Visit: Payer: Self-pay | Admitting: Family Medicine

## 2016-12-25 DIAGNOSIS — Z1231 Encounter for screening mammogram for malignant neoplasm of breast: Secondary | ICD-10-CM

## 2016-12-27 ENCOUNTER — Encounter: Payer: Self-pay | Admitting: Gynecology

## 2016-12-28 DIAGNOSIS — H52203 Unspecified astigmatism, bilateral: Secondary | ICD-10-CM | POA: Diagnosis not present

## 2016-12-28 DIAGNOSIS — H524 Presbyopia: Secondary | ICD-10-CM | POA: Diagnosis not present

## 2016-12-28 DIAGNOSIS — H2513 Age-related nuclear cataract, bilateral: Secondary | ICD-10-CM | POA: Insufficient documentation

## 2017-01-02 ENCOUNTER — Ambulatory Visit (INDEPENDENT_AMBULATORY_CARE_PROVIDER_SITE_OTHER)
Admission: RE | Admit: 2017-01-02 | Discharge: 2017-01-02 | Disposition: A | Discharge status: Home / Self Care | Payer: Medicare Other | Source: Ambulatory Visit | Attending: Family Medicine | Admitting: Family Medicine

## 2017-01-02 DIAGNOSIS — E2839 Other primary ovarian failure: Secondary | ICD-10-CM | POA: Diagnosis not present

## 2017-01-02 DIAGNOSIS — Z0000 Encounter for general adult medical examination without abnormal findings: Secondary | ICD-10-CM

## 2017-01-08 ENCOUNTER — Encounter: Payer: Self-pay | Admitting: Family Medicine

## 2017-01-09 ENCOUNTER — Telehealth: Payer: Self-pay | Admitting: Family Medicine

## 2017-01-09 ENCOUNTER — Encounter: Payer: Self-pay | Admitting: Family Medicine

## 2017-01-09 NOTE — Telephone Encounter (Signed)
Patient called back. Said she received a call from our office. Please call her back.

## 2017-01-09 NOTE — Telephone Encounter (Signed)
Returned call and patient notified of results.

## 2017-01-10 ENCOUNTER — Other Ambulatory Visit: Payer: Self-pay | Admitting: *Deleted

## 2017-01-10 MED ORDER — ALENDRONATE SODIUM 70 MG PO TABS: 70.0000 mg | ORAL_TABLET | ORAL | 11 refills | Status: DC

## 2017-01-12 ENCOUNTER — Ambulatory Visit
Admission: RE | Admit: 2017-01-12 | Discharge: 2017-01-12 | Disposition: A | Discharge status: Home / Self Care | Payer: Medicare Other | Source: Ambulatory Visit | Attending: Family Medicine | Admitting: Family Medicine

## 2017-01-12 DIAGNOSIS — Z1231 Encounter for screening mammogram for malignant neoplasm of breast: Secondary | ICD-10-CM | POA: Diagnosis not present

## 2017-01-18 ENCOUNTER — Ambulatory Visit: Payer: Medicare Other | Admitting: Family Medicine

## 2017-01-23 ENCOUNTER — Ambulatory Visit: Payer: Medicare Other

## 2017-01-24 ENCOUNTER — Encounter: Payer: Self-pay | Admitting: Family Medicine

## 2017-01-24 ENCOUNTER — Ambulatory Visit (INDEPENDENT_AMBULATORY_CARE_PROVIDER_SITE_OTHER): Payer: Medicare Other | Admitting: Family Medicine

## 2017-01-24 VITALS — BP 138/74 | HR 84 | Temp 98.2°F | Resp 16 | Ht 61.5 in | Wt 163.5 lb

## 2017-01-24 DIAGNOSIS — I10 Essential (primary) hypertension: Secondary | ICD-10-CM

## 2017-01-24 DIAGNOSIS — F411 Generalized anxiety disorder: Secondary | ICD-10-CM

## 2017-01-24 DIAGNOSIS — E7800 Pure hypercholesterolemia, unspecified: Secondary | ICD-10-CM

## 2017-01-24 DIAGNOSIS — N182 Chronic kidney disease, stage 2 (mild): Secondary | ICD-10-CM | POA: Diagnosis not present

## 2017-01-24 DIAGNOSIS — F4310 Post-traumatic stress disorder, unspecified: Secondary | ICD-10-CM

## 2017-01-24 DIAGNOSIS — F341 Dysthymic disorder: Secondary | ICD-10-CM | POA: Diagnosis not present

## 2017-01-24 DIAGNOSIS — M810 Age-related osteoporosis without current pathological fracture: Secondary | ICD-10-CM

## 2017-01-24 NOTE — Progress Notes (Signed)
OFFICE VISIT  01/24/2017   CC:  Chief Complaint  Patient presents with  . Follow-up    Anxiety and HLD   HPI:    Patient is a 76 y.o. Caucasian female who presents accompanied by her husband for 6 mo f/u anxiety/depression/PTSD, HTN, CRI II, and hyperlipidemia. They had a great time RV'ing to [place] and visited their family there for 2 months.  She also recently was rx'd alendronate for osteoporosis and plans on starting this tomorrow.  No home bp monitoring but compliant with meds. No exercise.  Diet is fair. Compliant with statin daily, no side effects. Still struggling with significant anxiety and depression but states she is "about as good as usual" and " maybe better than usual". Takes effexor daily but seems to take buspar only prn.   We spent some time discussing her CRI today: avoid NSAIDs, control bp, maintain good hydration.  ROS: chronic R groin pain/tightness, some R sided neck muscle tension/pain lately. No CP, no SOB, no LE swelling, no HAs, no palpitations, no myalgias or arthralgias.  Past Medical History:  Diagnosis Date  . Anxiety and depression    with PTSD.  Dr. Johnsie Cancel ordered repeat MRI brain and referred pt to neuro for neuropsych testing as of 05/17/16 office f/u.  Marland Kitchen Arthritis   . Bilateral carpal tunnel syndrome   . Cataracts, bilateral   . Chronic renal insufficiency, stage II (mild) 2015   CrCl about 60 ml/min  . Colitis    hx of  . DDD (degenerative disc disease), lumbar 2016   L1-2 and L5-S1 fairly severe--intramuscular injection of steroid and toradol by Dr. Antonietta Jewel 02/24/14 to calm down the pain  . Enterocele 05/2016   Dx'd by Dr. Toney Rakes (GYN); he referred pt back to the MD at [facility] that did her prior bladder sling procedure.  No rectocele.  Marland Kitchen GERD (gastroesophageal reflux disease)   . Goiter    Lobectomy (left) of thyroid for benign nodule.  Right lobe nodules followed by Dr. Chalmers Cater (FNA 2012 benign goiter) with ultrasounds and have been  stable, most recently 01/10/13.    . H/O adenomatous polyp of colon 2012; 05/2016   5 yr recall  . Hyperlipidemia   . Hypertension   . Lumbar spondylolysis 2016   L5: with grade I spondylolisthesis L5 on S1  . Osteoporosis 2012; 2018   2012-"penia".  2018 "porosis"--alendronate started 12/2016.  Repeat DEXA 12/2018.  Marland Kitchen PFO (patent foramen ovale) echo 03/21/05   "hole in heart" saw Dr. Sharee Holster [phone] [facility]. hospital.  Plavix med mgmt.  Dr. Johnsie Cancel repeated echo w/bubble study 05/2016 and it showed NO PFO or ASD.  Marland Kitchen Subclinical hypothyroidism    Dr. Chalmers Cater has her on 25 mcg synthroid qd as of 01/2014  . TIA (transient ischemic attack)    patient on plavix,  saw Dr. Isabell Jarvis at [facility].hospital in [place].  MRI brain 05/2016: mild chronic small vessel dz, o/w normal.  . Urinary tract infection    hx of  . Vertigo     Past Surgical History:  Procedure Laterality Date  . ABDOMINAL HYSTERECTOMY    . BLADDER SUSPENSION    . BREAST EXCISIONAL BIOPSY Right 1960`   x2  . BREAST SURGERY     "breast lumps removed"  . COLONOSCOPY W/ POLYPECTOMY  04/2011; 05/29/16   +Diverticulosis and int hem.  Adenomatous polyp 2012.  No polyps on repeat TCS 05/2016--recall 5 yrs (05/2021)  . DEXA  05/23/2011   2012 T-score -  2.2. 12/2016 T-score -2.6  . DILATION AND CURETTAGE OF UTERUS    . diverticulosis  05/2016   Noted on colonoscopy  . RECTOCELE REPAIR     purse string  . THYROIDECTOMY     Left lobectomy  . TOTAL KNEE ARTHROPLASTY  07/26/2012   Procedure: TOTAL KNEE ARTHROPLASTY;  Surgeon: Yvette Rack., MD;  Location: [place];  Service: Orthopedics;  Laterality: Right;  . TRANSTHORACIC ECHOCARDIOGRAM  05/24/2016   EF 60-65%, normal LV function.  No DD. No PFO or ASD (bubble study was done).    Outpatient Medications Prior to Visit  Medication Sig Dispense Refill  . amLODipine (NORVASC) 5 MG tablet TAKE ONE TABLET BY MOUTH ONCE DAILY 90 tablet 3  . aspirin EC 81 MG tablet Take 1  tablet (81 mg total) by mouth daily. 30 tablet 0  . atorvastatin (LIPITOR) 20 MG tablet TAKE ONE TABLET BY MOUTH ONCE DAILY 90 tablet 1  . busPIRone (BUSPAR) 10 MG tablet Take 1 tablet (10 mg total) by mouth 2 (two) times daily. 180 tablet 0  . levothyroxine (SYNTHROID, LEVOTHROID) 50 MCG tablet Take 1 tablet (50 mcg total) by mouth daily before breakfast. 30 tablet 6  . losartan (COZAAR) 100 MG tablet TAKE ONE TABLET BY MOUTH ONCE DAILY 90 tablet 1  . Multiple Vitamin (MV-ONE) CAPS Take 1 capsule by mouth daily.    . Omega-3 Fatty Acids (FISH OIL) 500 MG CAPS Take 1 capsule by mouth daily.    Marland Kitchen venlafaxine XR (EFFEXOR-XR) 75 MG 24 hr capsule Take 1 capsule (75 mg total) by mouth daily with breakfast. 90 capsule 3  . alendronate (FOSAMAX) 70 MG tablet Take 1 tablet (70 mg total) by mouth every 7 (seven) days. Take with a full glass of water on an empty stomach. (Patient not taking: Reported on 01/24/2017) 4 tablet 11  . atorvastatin (LIPITOR) 20 MG tablet Take 1 tablet (20 mg total) by mouth daily. (Patient not taking: Reported on 01/24/2017) 90 tablet 0  . losartan (COZAAR) 100 MG tablet TAKE 1 TABLET BY MOUTH EVERY DAY (Patient not taking: Reported on 01/24/2017) 90 tablet 1   Facility-Administered Medications Prior to Visit  Medication Dose Route Frequency Provider Last Rate Last Dose  . 0.9 %  sodium chloride infusion  500 mL Intravenous Continuous Ladene Artist, MD        Allergies  Allergen Reactions  . Sulfa Drugs Cross Reactors Rash    ROS As per HPI  PE: Blood pressure 138/74, pulse 84, temperature 98.2 F (36.8 C), temperature source Oral, resp. rate 16, height 5' 1.5" (1.562 m), weight 163 lb 8 oz (74.2 kg), SpO2 95 %. Gen: Alert, well appearing.  Patient is oriented to person, place, time, and situation. AFFECT: pleasant, lucid thought and speech. CV: RRR, no m/r/g.   LUNGS: CTA bilat, nonlabored resps, good aeration in all lung fields. EXT: no clubbing, cyanosis, or  edema.    LABS:  Lab Results  Component Value Date   TSH 2.50 09/23/2012     Chemistry      Component Value Date/Time   NA 142 06/21/2016 1039   K 5.1 06/21/2016 1039   CL 105 06/21/2016 1039   CO2 29 06/21/2016 1039   BUN 16 06/21/2016 1039   CREATININE 0.87 06/21/2016 1039   CREATININE 0.88 07/02/2015 0001      Component Value Date/Time   CALCIUM 10.5 06/21/2016 1039   ALKPHOS 91 06/21/2016 1039   AST 15 06/21/2016 1039  ALT 17 06/21/2016 1039   BILITOT 0.8 06/21/2016 1039     Lab Results  Component Value Date   CHOL 193 09/22/2015   HDL 55.40 09/22/2015   LDLCALC 107 (H) 09/22/2015   TRIG 153.0 (H) 09/22/2015   CHOLHDL 3 09/22/2015    IMPRESSION AND PLAN:  1) HTN; The current medical regimen is effective;  continue present plan and medications. Lytes/cr when she returns for FLP.  2) Hyperlipidemia: tolerating statin.  Return for fasting lipid panel.  3) CRI II (GFR 60s): avoid NSAIDs, hydrate well, control bp. Lytes/cr today.  4) Chronic anxiety/depression/PTSD: no counseling at this time---has not had any success with this. She struggles but feels like she is doing about as good as she can right now and the only med adjustment we made today was to have her take her buspar SCHEDULED bid, not prn.  5) Osteoporosis: start alendronate as rx'd.  Continue Ca++ and vit D. Repeat DEXA 12/2018.  An After Visit Summary was printed and given to the patient.  FOLLOW UP: Return in about 6 months (around 07/26/2017) for routine chronic illness f/u (30 min) + lab appt for fasting labs at pt's convenience.  Signed:  Crissie Sickles, MD           01/24/2017

## 2017-01-25 ENCOUNTER — Other Ambulatory Visit (INDEPENDENT_AMBULATORY_CARE_PROVIDER_SITE_OTHER): Payer: Medicare Other

## 2017-01-25 DIAGNOSIS — N182 Chronic kidney disease, stage 2 (mild): Secondary | ICD-10-CM | POA: Diagnosis not present

## 2017-01-25 DIAGNOSIS — I10 Essential (primary) hypertension: Secondary | ICD-10-CM | POA: Diagnosis not present

## 2017-01-25 DIAGNOSIS — E7800 Pure hypercholesterolemia, unspecified: Secondary | ICD-10-CM | POA: Diagnosis not present

## 2017-01-25 LAB — BASIC METABOLIC PANEL
BUN: 13 mg/dL (ref 6–23)
CALCIUM: 10.8 mg/dL — AB (ref 8.4–10.5)
CO2: 29 mEq/L (ref 19–32)
Chloride: 106 mEq/L (ref 96–112)
Creatinine, Ser: 1.02 mg/dL (ref 0.40–1.20)
GFR: 56.38 mL/min — AB (ref >60.00)
Glucose, Bld: 90 mg/dL (ref 70–99)
Potassium: 5 mEq/L (ref 3.5–5.1)
Sodium: 142 mEq/L (ref 135–145)

## 2017-01-25 LAB — LIPID PANEL
CHOLESTEROL: 171 mg/dL (ref 0–200)
HDL: 50.3 mg/dL (ref >39.00)
LDL CALC: 88 mg/dL (ref 0–99)
NonHDL: 120.79
TRIGLYCERIDES: 164 mg/dL — AB (ref 0.0–149.0)
Total CHOL/HDL Ratio: 3
VLDL: 32.8 mg/dL (ref 0.0–40.0)

## 2017-01-26 ENCOUNTER — Encounter: Payer: Self-pay | Admitting: *Deleted

## 2017-01-31 DIAGNOSIS — E039 Hypothyroidism, unspecified: Secondary | ICD-10-CM | POA: Diagnosis not present

## 2017-01-31 DIAGNOSIS — E049 Nontoxic goiter, unspecified: Secondary | ICD-10-CM | POA: Diagnosis not present

## 2017-02-23 DIAGNOSIS — L570 Actinic keratosis: Secondary | ICD-10-CM | POA: Diagnosis not present

## 2017-02-23 DIAGNOSIS — L814 Other melanin hyperpigmentation: Secondary | ICD-10-CM | POA: Diagnosis not present

## 2017-02-23 DIAGNOSIS — L720 Epidermal cyst: Secondary | ICD-10-CM | POA: Diagnosis not present

## 2017-02-23 DIAGNOSIS — L821 Other seborrheic keratosis: Secondary | ICD-10-CM | POA: Diagnosis not present

## 2017-02-23 DIAGNOSIS — L80 Vitiligo: Secondary | ICD-10-CM | POA: Diagnosis not present

## 2017-03-29 ENCOUNTER — Other Ambulatory Visit: Payer: Self-pay | Admitting: Family Medicine

## 2017-03-29 NOTE — Telephone Encounter (Signed)
[facility] 

## 2017-04-05 DIAGNOSIS — E039 Hypothyroidism, unspecified: Secondary | ICD-10-CM | POA: Diagnosis not present

## 2017-05-28 ENCOUNTER — Other Ambulatory Visit: Payer: Self-pay | Admitting: Family Medicine

## 2017-05-28 NOTE — Telephone Encounter (Signed)
[facility]  RF request for buspirone LOV: 01/24/17 Next ov: None Last written: 07/20/16 #180 w/ 0RF  Please advise. Thanks.

## 2017-06-05 ENCOUNTER — Ambulatory Visit: Payer: Medicare Other

## 2017-06-05 ENCOUNTER — Ambulatory Visit (INDEPENDENT_AMBULATORY_CARE_PROVIDER_SITE_OTHER): Payer: Medicare Other

## 2017-06-05 DIAGNOSIS — Z23 Encounter for immunization: Secondary | ICD-10-CM

## 2017-06-20 ENCOUNTER — Other Ambulatory Visit: Payer: Self-pay | Admitting: Family Medicine

## 2017-07-02 DIAGNOSIS — D2310 Other benign neoplasm of skin of unspecified eyelid, including canthus: Secondary | ICD-10-CM | POA: Insufficient documentation

## 2017-07-02 DIAGNOSIS — H43393 Other vitreous opacities, bilateral: Secondary | ICD-10-CM | POA: Diagnosis not present

## 2017-07-02 DIAGNOSIS — H52203 Unspecified astigmatism, bilateral: Secondary | ICD-10-CM | POA: Diagnosis not present

## 2017-07-02 DIAGNOSIS — H2513 Age-related nuclear cataract, bilateral: Secondary | ICD-10-CM | POA: Diagnosis not present

## 2017-07-02 DIAGNOSIS — D23112 Other benign neoplasm of skin of right lower eyelid, including canthus: Secondary | ICD-10-CM | POA: Diagnosis not present

## 2017-07-02 DIAGNOSIS — H524 Presbyopia: Secondary | ICD-10-CM | POA: Diagnosis not present

## 2017-07-02 DIAGNOSIS — H5203 Hypermetropia, bilateral: Secondary | ICD-10-CM | POA: Diagnosis not present

## 2017-07-02 DIAGNOSIS — D23122 Other benign neoplasm of skin of left lower eyelid, including canthus: Secondary | ICD-10-CM | POA: Diagnosis not present

## 2017-08-14 DIAGNOSIS — T56891A Toxic effect of other metals, accidental (unintentional), initial encounter: Secondary | ICD-10-CM

## 2017-08-14 HISTORY — DX: Toxic effect of other metals, accidental (unintentional), initial encounter: T56.891A

## 2017-08-30 ENCOUNTER — Other Ambulatory Visit: Payer: Self-pay | Admitting: Family Medicine

## 2017-08-30 NOTE — Telephone Encounter (Signed)
MyChart message read.

## 2017-09-04 ENCOUNTER — Other Ambulatory Visit: Payer: Self-pay | Admitting: Family Medicine

## 2017-09-17 ENCOUNTER — Ambulatory Visit (INDEPENDENT_AMBULATORY_CARE_PROVIDER_SITE_OTHER): Payer: Medicare Other | Admitting: Family Medicine

## 2017-09-17 ENCOUNTER — Encounter: Payer: Self-pay | Admitting: Family Medicine

## 2017-09-17 VITALS — BP 147/85 | HR 88 | Temp 98.7°F | Resp 16 | Ht 61.5 in | Wt 163.8 lb

## 2017-09-17 DIAGNOSIS — F411 Generalized anxiety disorder: Secondary | ICD-10-CM | POA: Diagnosis not present

## 2017-09-17 DIAGNOSIS — F339 Major depressive disorder, recurrent, unspecified: Secondary | ICD-10-CM | POA: Diagnosis not present

## 2017-09-17 DIAGNOSIS — F4000 Agoraphobia, unspecified: Secondary | ICD-10-CM

## 2017-09-17 DIAGNOSIS — F5105 Insomnia due to other mental disorder: Secondary | ICD-10-CM

## 2017-09-17 MED ORDER — LITHIUM CARBONATE 300 MG PO TABS: 300.0000 mg | ORAL_TABLET | Freq: Two times a day (BID) | ORAL | 0 refills | Status: DC

## 2017-09-17 MED ORDER — CLONAZEPAM 0.5 MG PO TABS: ORAL_TABLET | ORAL | 1 refills | Status: DC

## 2017-09-17 MED ORDER — AMLODIPINE BESYLATE 5 MG PO TABS: 5.0000 mg | ORAL_TABLET | Freq: Every day | ORAL | 1 refills | Status: DC

## 2017-09-17 NOTE — Patient Instructions (Signed)
Don't take your lithium the morning of your next appointment.  (Fasting is not necessary).

## 2017-09-17 NOTE — Progress Notes (Signed)
OFFICE VISIT  09/17/2017   CC:  Chief Complaint  Patient presents with  . Follow-up    Anxiety   HPI:    Patient is a 77 y.o. Caucasian female who presents accompanied by her husband for c/o of ongoing problems with anxiety--severe worsening lately. Was at her baseline level of anx/dep until a couple weeks ago. Funeral just prior to worsening but insists she ok with this and it was not a trigger for her worsening mood/anxiety.  Says this came out of the blue shortly after the funeral. Severe anxiety/tremulousness, impairing sleep, having bad nightmares, feeling down and hopeless, anhedonia, poor appetite, feels bad about herself, trouble concentrating, fidgety and restless---all of these pretty much daily and constant. Worries about life in general, denies anything specific.  Has fear of leaving her home.  Good relationship with husband. Financially comfortably.  Some PTSD sx's as well: reliving of past trauma.   Melatonin no help.   She is extremely frustrated, wants answers, wants to be made better right away, etc.  Eight months ago her synthroid dose was increased a little, says none of these sx's started near that time.  Has annual f/u with endo summer 2019.  Reviewed old meds list: she has been on many psychotropic meds in the past, including paxil, citalopram, clonaz, bupropion, pristiq, ritalin (ever approved by insurer?) and lamictal.   She either did not respond to these meds or was intolerant of them.   Past insomnia meds: clonaz, restoril, belsomra.  Past Medical History:  Diagnosis Date  . Anxiety and depression    with PTSD.  Dr. Johnsie Cancel ordered repeat MRI brain and referred pt to neuro for neuropsych testing as of 05/17/16 office f/u.  Marland Kitchen Arthritis   . Bilateral carpal tunnel syndrome   . Cataracts, bilateral   . Chronic renal insufficiency, stage II (mild) 2015   CrCl about 60 ml/min  . Colitis    hx of  . DDD (degenerative disc disease), lumbar 2016   L1-2 and L5-S1  fairly severe--intramuscular injection of steroid and toradol by Dr. Antonietta Jewel 02/24/14 to calm down the pain  . Enterocele 05/2016   Dx'd by Dr. Toney Rakes (GYN); he referred pt back to the MD at [facility] that did her prior bladder sling procedure.  No rectocele.  Marland Kitchen GERD (gastroesophageal reflux disease)   . Goiter    Lobectomy (left) of thyroid for benign nodule.  Right lobe nodules followed by Dr. Chalmers Cater (FNA 2012 benign goiter) with ultrasounds and have been stable, most recently 01/10/13.    . H/O adenomatous polyp of colon 2012; 05/2016   5 yr recall  . Hyperlipidemia   . Hypertension   . Lumbar spondylolysis 2016   L5: with grade I spondylolisthesis L5 on S1  . Osteoporosis 2012; 2018   2012-"penia".  2018 "porosis"--alendronate started 12/2016.  Repeat DEXA 12/2018.  Marland Kitchen PFO (patent foramen ovale) echo 03/21/05   "hole in heart" saw Dr. Sharee Holster [phone] [facility]. hospital.  Plavix med mgmt.  Dr. Johnsie Cancel repeated echo w/bubble study 05/2016 and it showed NO PFO or ASD.  Marland Kitchen Subclinical hypothyroidism    Dr. Chalmers Cater has her on 25 mcg synthroid qd as of 01/2014  . TIA (transient ischemic attack)    patient on plavix,  saw Dr. Isabell Jarvis at [facility].hospital in [place].  MRI brain 05/2016: mild chronic small vessel dz, o/w normal.  . Urinary tract infection    hx of  . Vertigo     Past Surgical History:  Procedure Laterality Date  . ABDOMINAL HYSTERECTOMY    . BLADDER SUSPENSION    . BREAST EXCISIONAL BIOPSY Right 1960`   x2  . BREAST SURGERY     "breast lumps removed"  . COLONOSCOPY W/ POLYPECTOMY  04/2011; 05/29/16   +Diverticulosis and int hem.  Adenomatous polyp 2012.  No polyps on repeat TCS 05/2016--recall 5 yrs (05/2021)  . DEXA  05/23/2011   2012 T-score -2.2. 12/2016 T-score -2.6  . DILATION AND CURETTAGE OF UTERUS    . diverticulosis  05/2016   Noted on colonoscopy  . RECTOCELE REPAIR     purse string  . THYROIDECTOMY     Left lobectomy  . TOTAL KNEE ARTHROPLASTY   07/26/2012   Procedure: TOTAL KNEE ARTHROPLASTY;  Surgeon: Yvette Rack., MD;  Location: [facility];  Service: Orthopedics;  Laterality: Right;  . TRANSTHORACIC ECHOCARDIOGRAM  05/24/2016   EF 60-65%, normal LV function.  No DD. No PFO or ASD (bubble study was done).    Outpatient Medications Prior to Visit  Medication Sig Dispense Refill  . alendronate (FOSAMAX) 70 MG tablet Take 1 tablet (70 mg total) by mouth every 7 (seven) days. Take with a full glass of water on an empty stomach. 4 tablet 11  . aspirin EC 81 MG tablet Take 1 tablet (81 mg total) by mouth daily. 30 tablet 0  . atorvastatin (LIPITOR) 20 MG tablet TAKE 1 TABLET BY MOUTH ONCE DAILY 90 tablet 1  . busPIRone (BUSPAR) 10 MG tablet Take 1 tablet (10 mg total) by mouth 2 (two) times daily. 180 tablet 0  . levothyroxine (SYNTHROID, LEVOTHROID) 50 MCG tablet Take 1 tablet (50 mcg total) by mouth daily before breakfast. 30 tablet 6  . losartan (COZAAR) 100 MG tablet TAKE 1 TABLET BY MOUTH ONCE DAILY 90 tablet 0  . Multiple Vitamin (MV-ONE) CAPS Take 1 capsule by mouth daily.    . Omega-3 Fatty Acids (FISH OIL) 500 MG CAPS Take 1 capsule by mouth daily.    Marland Kitchen venlafaxine XR (EFFEXOR-XR) 75 MG 24 hr capsule TAKE ONE CAPSULE BY MOUTH DAILY WITH BREAKFAST. 90 capsule 0  . amLODipine (NORVASC) 5 MG tablet TAKE ONE TABLET BY MOUTH ONCE DAILY 90 tablet 1  . busPIRone (BUSPAR) 10 MG tablet TAKE ONE TABLET BY MOUTH THREE TIMES DAILY (Patient not taking: Reported on 09/17/2017) 270 tablet 1  . 0.9 %  sodium chloride infusion      No facility-administered medications prior to visit.     Allergies  Allergen Reactions  . Phenylephrine Rash    Patient had allergic reaction to dilation combo drop. Please dilate with Tropicamide 0.5% only with Fluress and/or Proparacaine   . Sulfa Drugs Cross Reactors Rash    ROS As per HPI  PE: Blood pressure (!) 147/85, pulse 88, temperature 98.7 F (37.1 C), temperature source Oral, resp. rate 16, height  5' 1.5" (1.562 m), weight 163 lb 12 oz (74.3 kg), SpO2 98 %. Gen: Alert, well appearing.  Patient is oriented to person, place, time, and situation. AFFECT: irritated, frustrated, mildly pressured sleep, lucid thought and speech.  Crying intermittently. She repeats over and over that she doesn't understand why it just "came out of the blue". CV: RRR, no m/r/g.   LUNGS: CTA bilat, nonlabored resps, good aeration in all lung fields. Neuro: CN 2-12 intact bilaterally, strength 5/5 in proximal and distal upper extremities and lower extremities bilaterally.    No tremor.  No disdiadochokinesis.  No ataxia.  No  pronator drift.   LABS:    Chemistry      Component Value Date/Time   NA 142 01/25/2017 0929   K 5.0 01/25/2017 0929   CL 106 01/25/2017 0929   CO2 29 01/25/2017 0929   BUN 13 01/25/2017 0929   CREATININE 1.02 01/25/2017 0929   CREATININE 0.88 07/02/2015 0001      Component Value Date/Time   CALCIUM 10.8 (H) 01/25/2017 0929   ALKPHOS 91 06/21/2016 1039   AST 15 06/21/2016 1039   ALT 17 06/21/2016 1039   BILITOT 0.8 06/21/2016 1039     Lab Results  Component Value Date   CHOL 171 01/25/2017   HDL 50.30 01/25/2017   LDLCALC 88 01/25/2017   TRIG 164.0 (H) 01/25/2017   CHOLHDL 3 01/25/2017   Lab Results  Component Value Date   WBC 7.4 12/24/2013   HGB 14.7 12/24/2013   HCT 43.6 12/24/2013   MCV 89.9 12/24/2013   PLT 268.0 12/24/2013    IMPRESSION AND PLAN:  MDD--treatment resistant, PTSD, severe anxiety.  Also, question of bipolar II w/mixed state.    I think lithium would be the best treatment at this time---add to current dosing of venlafaxine and buspar.  Start lithium 300 mg bid.  Therapeutic expectations and side effect profile of medication discussed today.  Patient's questions answered. Has significant anxiety/mood related insomnia: clonazepam 0.5mg , 1-2 qhs prn, #30, rf x 1---faxed to pharmacy. Pt declines counseling over and over b/c it has not helped in the  past. Emotional support given today.  If pt tolerating med and we're going to keep her on it at f/u in 10d, then we'll check a trough lithium level at that time as well as a TSH, CMET, and CBC.  Spent 40 min with pt today, with >50% of this time spent in counseling and care coordination regarding the above problems.  An After Visit Summary was printed and given to the patient.  FOLLOW UP: Return in about 10 days (around 09/27/2017).  Signed:  Crissie Sickles, MD           09/17/2017

## 2017-09-26 ENCOUNTER — Encounter: Payer: Self-pay | Admitting: Family Medicine

## 2017-09-26 ENCOUNTER — Ambulatory Visit (INDEPENDENT_AMBULATORY_CARE_PROVIDER_SITE_OTHER): Payer: Medicare Other | Admitting: Family Medicine

## 2017-09-26 VITALS — BP 121/77 | HR 73 | Temp 98.2°F | Resp 16 | Wt 165.0 lb

## 2017-09-26 DIAGNOSIS — F411 Generalized anxiety disorder: Secondary | ICD-10-CM | POA: Diagnosis not present

## 2017-09-26 DIAGNOSIS — F5105 Insomnia due to other mental disorder: Secondary | ICD-10-CM | POA: Diagnosis not present

## 2017-09-26 DIAGNOSIS — F332 Major depressive disorder, recurrent severe without psychotic features: Secondary | ICD-10-CM | POA: Diagnosis not present

## 2017-09-26 DIAGNOSIS — Z79899 Other long term (current) drug therapy: Secondary | ICD-10-CM

## 2017-09-26 LAB — CBC WITH DIFFERENTIAL/PLATELET
Basophils Absolute: 0.1 10*3/uL (ref 0.0–0.1)
Basophils Relative: 1.2 % (ref 0.0–3.0)
EOS PCT: 3.4 % (ref 0.0–5.0)
Eosinophils Absolute: 0.3 10*3/uL (ref 0.0–0.7)
HCT: 43.6 % (ref 36.0–46.0)
Hemoglobin: 14.4 g/dL (ref 12.0–15.0)
LYMPHS ABS: 1.4 10*3/uL (ref 0.7–4.0)
Lymphocytes Relative: 18.2 % (ref 12.0–46.0)
MCHC: 32.9 g/dL (ref 30.0–36.0)
MCV: 89.3 fl (ref 78.0–100.0)
MONO ABS: 0.5 10*3/uL (ref 0.1–1.0)
Monocytes Relative: 6.8 % (ref 3.0–12.0)
NEUTROS ABS: 5.5 10*3/uL (ref 1.4–7.7)
NEUTROS PCT: 70.4 % (ref 43.0–77.0)
PLATELETS: 252 10*3/uL (ref 150.0–400.0)
RBC: 4.89 Mil/uL (ref 3.87–5.11)
RDW: 13.5 % (ref 11.5–15.5)
WBC: 7.9 10*3/uL (ref 4.0–10.5)

## 2017-09-26 LAB — COMPREHENSIVE METABOLIC PANEL
ALT: 14 U/L (ref 0–35)
AST: 12 U/L (ref 0–37)
Albumin: 4.3 g/dL (ref 3.5–5.2)
Alkaline Phosphatase: 67 U/L (ref 39–117)
BUN: 15 mg/dL (ref 6–23)
CALCIUM: 10 mg/dL (ref 8.4–10.5)
CO2: 29 meq/L (ref 19–32)
CREATININE: 1.19 mg/dL (ref 0.40–1.20)
Chloride: 106 mEq/L (ref 96–112)
GFR: 47.1 mL/min — ABNORMAL LOW (ref >60.00)
GLUCOSE: 87 mg/dL (ref 70–99)
Potassium: 4.3 mEq/L (ref 3.5–5.1)
Sodium: 142 mEq/L (ref 135–145)
Total Bilirubin: 1 mg/dL (ref 0.2–1.2)
Total Protein: 6.9 g/dL (ref 6.0–8.3)

## 2017-09-26 LAB — TSH: TSH: 1.01 u[IU]/mL (ref 0.35–4.50)

## 2017-09-26 MED ORDER — LITHIUM CARBONATE 300 MG PO TABS: 300.0000 mg | ORAL_TABLET | Freq: Two times a day (BID) | ORAL | 0 refills | Status: DC

## 2017-09-26 NOTE — Progress Notes (Signed)
OFFICE VISIT  09/26/2017   CC:  Chief Complaint  Patient presents with  . Depression    follow up   HPI:    Patient is a 77 y.o. Caucasian female who presents accompanied by her husband for 10 day f/u recurrent MDD resistant to treatment, GAD, and insomnia.  Started lithium 300 mg bid last visit, kept pt on same doses of venlafaxine and buspar. Started clonazepam 0.5mg  for insomnia.  She feels improved--started about 3-4 d after starting lithium.  Less nervous/more calm on the inside.  Mood: less depressed, less frustrated, feels like being around people more now.  Had some dizziness initially but this is almost gone (plus she was having a bit of this prior to starting lithium as well).  No other side effects.  Insomnia: took a few clonaz prn and is overall sleeping much better.  ROS: no HAs, no palpitations, no tremors, no excessive urination or thirst, no rash, no SI or HI.  Past Medical History:  Diagnosis Date  . Anxiety and depression    with PTSD.  Dr. Johnsie Cancel ordered repeat MRI brain and referred pt to neuro for neuropsych testing as of 05/17/16 office f/u.  Marland Kitchen Arthritis   . Bilateral carpal tunnel syndrome   . Cataracts, bilateral   . Chronic renal insufficiency, stage II (mild) 2015   CrCl about 60 ml/min  . Colitis    hx of  . DDD (degenerative disc disease), lumbar 2016   L1-2 and L5-S1 fairly severe--intramuscular injection of steroid and toradol by Dr. Antonietta Jewel 02/24/14 to calm down the pain  . Enterocele 05/2016   Dx'd by Dr. Toney Rakes (GYN); he referred pt back to the MD at [facility] that did her prior bladder sling procedure.  No rectocele.  Marland Kitchen GERD (gastroesophageal reflux disease)   . Goiter    Lobectomy (left) of thyroid for benign nodule.  Right lobe nodules followed by Dr. Chalmers Cater (FNA 2012 benign goiter) with ultrasounds and have been stable, most recently 01/10/13.    . H/O adenomatous polyp of colon 2012; 05/2016   5 yr recall  . Hyperlipidemia   . Hypertension    . Lumbar spondylolysis 2016   L5: with grade I spondylolisthesis L5 on S1  . Osteoporosis 2012; 2018   2012-"penia".  2018 "porosis"--alendronate started 12/2016.  Repeat DEXA 12/2018.  Marland Kitchen PFO (patent foramen ovale) echo 03/21/05   "hole in heart" saw Dr. Sharee Holster [phone] [facility]. hospital.  Plavix med mgmt.  Dr. Johnsie Cancel repeated echo w/bubble study 05/2016 and it showed NO PFO or ASD.  Marland Kitchen Subclinical hypothyroidism    Dr. Chalmers Cater has her on 25 mcg synthroid qd as of 01/2014  . TIA (transient ischemic attack)    patient on plavix,  saw Dr. Isabell Jarvis at [facility].hospital in [place].  MRI brain 05/2016: mild chronic small vessel dz, o/w normal.  . Urinary tract infection    hx of  . Vertigo     Past Surgical History:  Procedure Laterality Date  . ABDOMINAL HYSTERECTOMY    . BLADDER SUSPENSION    . BREAST EXCISIONAL BIOPSY Right 1960`   x2  . BREAST SURGERY     "breast lumps removed"  . COLONOSCOPY W/ POLYPECTOMY  04/2011; 05/29/16   +Diverticulosis and int hem.  Adenomatous polyp 2012.  No polyps on repeat TCS 05/2016--recall 5 yrs (05/2021)  . DEXA  05/23/2011   2012 T-score -2.2. 12/2016 T-score -2.6  . DILATION AND CURETTAGE OF UTERUS    . diverticulosis  05/2016   Noted on colonoscopy  . RECTOCELE REPAIR     purse string  . THYROIDECTOMY     Left lobectomy  . TOTAL KNEE ARTHROPLASTY  07/26/2012   Procedure: TOTAL KNEE ARTHROPLASTY;  Surgeon: Yvette Rack., MD;  Location: [facility];  Service: Orthopedics;  Laterality: Right;  . TRANSTHORACIC ECHOCARDIOGRAM  05/24/2016   EF 60-65%, normal LV function.  No DD. No PFO or ASD (bubble study was done).    Outpatient Medications Prior to Visit  Medication Sig Dispense Refill  . alendronate (FOSAMAX) 70 MG tablet Take 1 tablet (70 mg total) by mouth every 7 (seven) days. Take with a full glass of water on an empty stomach. 4 tablet 11  . amLODipine (NORVASC) 5 MG tablet Take 1 tablet (5 mg total) by mouth daily. 90 tablet 1   . aspirin EC 81 MG tablet Take 1 tablet (81 mg total) by mouth daily. 30 tablet 0  . atorvastatin (LIPITOR) 20 MG tablet TAKE 1 TABLET BY MOUTH ONCE DAILY 90 tablet 1  . busPIRone (BUSPAR) 10 MG tablet Take 1 tablet (10 mg total) by mouth 2 (two) times daily. 180 tablet 0  . clonazePAM (KLONOPIN) 0.5 MG tablet 1-2 tabs po qhs prn insomnia 30 tablet 1  . levothyroxine (SYNTHROID, LEVOTHROID) 50 MCG tablet Take 1 tablet (50 mcg total) by mouth daily before breakfast. 30 tablet 6  . losartan (COZAAR) 100 MG tablet TAKE 1 TABLET BY MOUTH ONCE DAILY 90 tablet 0  . Multiple Vitamin (MV-ONE) CAPS Take 1 capsule by mouth daily.    . Omega-3 Fatty Acids (FISH OIL) 500 MG CAPS Take 1 capsule by mouth daily.    Marland Kitchen venlafaxine XR (EFFEXOR-XR) 75 MG 24 hr capsule TAKE ONE CAPSULE BY MOUTH DAILY WITH BREAKFAST. 90 capsule 0  . lithium 300 MG tablet Take 1 tablet (300 mg total) by mouth 2 (two) times daily. 30 tablet 0   No facility-administered medications prior to visit.     Allergies  Allergen Reactions  . Phenylephrine Rash    Patient had allergic reaction to dilation combo drop. Please dilate with Tropicamide 0.5% only with Fluress and/or Proparacaine   . Sulfa Drugs Cross Reactors Rash    ROS As per HPI  PE: Blood pressure 121/77, pulse 73, temperature 98.2 F (36.8 C), temperature source Oral, resp. rate 16, weight 165 lb (74.8 kg), SpO2 95 %. Gen: Alert, well appearing.  Patient is oriented to person, place, time, and situation. AFFECT: pleasant, lucid thought and speech. No further exam today.  LABS:    Chemistry      Component Value Date/Time   NA 142 01/25/2017 0929   K 5.0 01/25/2017 0929   CL 106 01/25/2017 0929   CO2 29 01/25/2017 0929   BUN 13 01/25/2017 0929   CREATININE 1.02 01/25/2017 0929   CREATININE 0.88 07/02/2015 0001      Component Value Date/Time   CALCIUM 10.8 (H) 01/25/2017 0929   ALKPHOS 91 06/21/2016 1039   AST 15 06/21/2016 1039   ALT 17 06/21/2016 1039    BILITOT 0.8 06/21/2016 1039     Lab Results  Component Value Date   WBC 7.4 12/24/2013   HGB 14.7 12/24/2013   HCT 43.6 12/24/2013   MCV 89.9 12/24/2013   PLT 268.0 12/24/2013   Lab Results  Component Value Date   TSH 2.50 09/23/2012   IMPRESSION AND PLAN:  1) Recurrent MDD with hx of treatment resistance. Improved some on lithium  for the last 10d. No signif side effects. TSH, CMET, CBC, lithium trough today. Consider TMS therapy in future if her lithium does not continue to improve her sx's or if pt becomes intolerant of this med for some reason. She'll be going to [place] with husband for over a month to visit friends. F/u when she returns. Keep lithium dosing at 300 bid for now.  2) GAD: improved.  3) Insomnia: improved.  Continue clonaz qhs prn.  An After Visit Summary was printed and given to the patient.  FOLLOW UP: Return for f/u mood/anx last week of march or first week of April 2019.  Signed:  Crissie Sickles, MD           09/26/2017

## 2017-09-27 LAB — LITHIUM LEVEL: Lithium Lvl: 0.8 mmol/L (ref 0.6–1.2)

## 2017-10-12 HISTORY — DX: Hypercalcemia: E83.52

## 2017-10-18 ENCOUNTER — Telehealth: Payer: Self-pay | Admitting: *Deleted

## 2017-10-18 NOTE — Telephone Encounter (Signed)
Also recommend that patient STOP the lithium.-thx!

## 2017-10-18 NOTE — Telephone Encounter (Signed)
Please triage pt  Thanks!

## 2017-10-18 NOTE — Telephone Encounter (Signed)
Spoke with patients spouse, Fritz Pickerel.  Spouse states patients behavior seems to be getting worse. He reports patient's voice is trembling, increased crying, decreased appetite, unsteady gait and occasional nausea. He states her behavior seems worse than prior to starting Lithium. Insomnia is improved. Fritz Pickerel is requesting appointment with PCP, scheduled for Monday, 10/22/17 (30 min slot). Advised if patient's symptoms get worse, to go to Emergency Room. Husband voiced understanding.

## 2017-10-18 NOTE — Telephone Encounter (Signed)
Copied from [place] [id number]. Topic: Appointment Scheduling - Scheduling Inquiry for Clinic >> Oct 18, 2017  1:15 PM Oliver Pila B wrote: Reason for CRM: pt's husband called and states pt is not feeling well, pt husband states Dr. Anitra Lauth has her on some medications that are helping but at the moment the pt is not feeling well and would like to see the pcp tomorrow; if there is a slot available for the pt contact pt to schedule

## 2017-10-18 NOTE — Telephone Encounter (Signed)
Instructed patients spouse, Fritz Pickerel, to stop Lithium per Dr. Anitra Lauth. Husband verbalized understanding, plans to cut back to 1 tablet/day today and tomorrow, then stop.

## 2017-10-22 ENCOUNTER — Ambulatory Visit (INDEPENDENT_AMBULATORY_CARE_PROVIDER_SITE_OTHER): Payer: Medicare Other | Admitting: Family Medicine

## 2017-10-22 ENCOUNTER — Encounter: Payer: Self-pay | Admitting: Family Medicine

## 2017-10-22 VITALS — BP 132/80 | HR 77 | Temp 98.0°F | Resp 16 | Ht 61.5 in | Wt 156.0 lb

## 2017-10-22 DIAGNOSIS — T56891A Toxic effect of other metals, accidental (unintentional), initial encounter: Secondary | ICD-10-CM | POA: Diagnosis not present

## 2017-10-22 DIAGNOSIS — R471 Dysarthria and anarthria: Secondary | ICD-10-CM | POA: Diagnosis not present

## 2017-10-22 DIAGNOSIS — F333 Major depressive disorder, recurrent, severe with psychotic symptoms: Secondary | ICD-10-CM | POA: Diagnosis not present

## 2017-10-22 DIAGNOSIS — T50905A Adverse effect of unspecified drugs, medicaments and biological substances, initial encounter: Secondary | ICD-10-CM | POA: Diagnosis not present

## 2017-10-22 DIAGNOSIS — R2689 Other abnormalities of gait and mobility: Secondary | ICD-10-CM

## 2017-10-22 LAB — COMPREHENSIVE METABOLIC PANEL
ALBUMIN: 4.7 g/dL (ref 3.5–5.2)
ALK PHOS: 76 U/L (ref 39–117)
ALT: 17 U/L (ref 0–35)
AST: 15 U/L (ref 0–37)
BUN: 13 mg/dL (ref 6–23)
CHLORIDE: 105 meq/L (ref 96–112)
CO2: 29 mEq/L (ref 19–32)
CREATININE: 1.1 mg/dL (ref 0.40–1.20)
Calcium: 12.1 mg/dL — ABNORMAL HIGH (ref 8.4–10.5)
GFR: 51.57 mL/min — ABNORMAL LOW (ref >60.00)
GLUCOSE: 85 mg/dL (ref 70–99)
POTASSIUM: 4.6 meq/L (ref 3.5–5.1)
SODIUM: 143 meq/L (ref 135–145)
TOTAL PROTEIN: 7.2 g/dL (ref 6.0–8.3)
Total Bilirubin: 0.9 mg/dL (ref 0.2–1.2)

## 2017-10-22 LAB — CBC WITH DIFFERENTIAL/PLATELET
BASOS PCT: 0.6 % (ref 0.0–3.0)
Basophils Absolute: 0.1 10*3/uL (ref 0.0–0.1)
EOS PCT: 1 % (ref 0.0–5.0)
Eosinophils Absolute: 0.1 10*3/uL (ref 0.0–0.7)
HCT: 43.1 % (ref 36.0–46.0)
HEMOGLOBIN: 14.3 g/dL (ref 12.0–15.0)
LYMPHS ABS: 2.1 10*3/uL (ref 0.7–4.0)
Lymphocytes Relative: 18.9 % (ref 12.0–46.0)
MCHC: 33.2 g/dL (ref 30.0–36.0)
MCV: 89.9 fl (ref 78.0–100.0)
MONO ABS: 0.6 10*3/uL (ref 0.1–1.0)
Monocytes Relative: 5.8 % (ref 3.0–12.0)
NEUTROS ABS: 8.1 10*3/uL — AB (ref 1.4–7.7)
NEUTROS PCT: 73.7 % (ref 43.0–77.0)
PLATELETS: 245 10*3/uL (ref 150.0–400.0)
RBC: 4.8 Mil/uL (ref 3.87–5.11)
RDW: 13.8 % (ref 11.5–15.5)
WBC: 11 10*3/uL — ABNORMAL HIGH (ref 4.0–10.5)

## 2017-10-22 NOTE — Patient Instructions (Signed)
To schedule a free consultation to find out if [facility] is right for you, call [phone] or go to [email or web address]

## 2017-10-22 NOTE — Progress Notes (Signed)
OFFICE VISIT  10/22/2017   CC:  Chief Complaint  Patient presents with  . Follow-up    Mood     HPI:    Patient is a 77 y.o. Caucasian female who presents for f/u severe anxiety and depression, PTSD. Most recent addition to her psych regimen was lithium 300mg  bid. On initial (10 day) f/u for this med she was tolerating it fine, feeling a little bit improved, lithium trough was 0.8. Started clonaz 0.5mg  for insomnia as well. About a week later. Says she feels like the lithium made her depression worse, lots more crying.  Cognitive impairment and slurred speech and shuffling of feet, all getting slowly better.  Dizziness of/on, bad dreams, poor appetite, can't sleep. Stopped the med 5 days ago.  Says she feels "worse than I've ever felt" but unfortunately she has said that several times at previous office visits. However, she has never reported the level of intermittent confusion/memory problems she has recently been having, nor any dysarthria or "shuffling" gait in the past.   Past MRI when she was thought to have had a PFO showed mild nonspecific white matter changes c/w microvasc isch changes.  F/u echo done 05/2016 showed NO PFO.  At that time we took her off of her plavix.  She currently takes 81mg  ASA qd.  ROS: no fevers, no HA, no focal weakness.  No dysphagia, no cough, no SOB, no CP, no joint swelling.  Past Medical History:  Diagnosis Date  . Anxiety and depression    with PTSD.  Dr. Johnsie Cancel ordered repeat MRI brain and referred pt to neuro for neuropsych testing as of 05/17/16 office f/u.  Marland Kitchen Arthritis   . Bilateral carpal tunnel syndrome   . Cataracts, bilateral   . Chronic renal insufficiency, stage II (mild) 2015   CrCl about 60 ml/min  . Colitis    hx of  . DDD (degenerative disc disease), lumbar 2016   L1-2 and L5-S1 fairly severe--intramuscular injection of steroid and toradol by Dr. Antonietta Jewel 02/24/14 to calm down the pain  . Enterocele 05/2016   Dx'd by Dr.  Toney Rakes (GYN); he referred pt back to the MD at [facility] that did her prior bladder sling procedure.  No rectocele.  Marland Kitchen GERD (gastroesophageal reflux disease)   . Goiter    Lobectomy (left) of thyroid for benign nodule.  Right lobe nodules followed by Dr. Chalmers Cater (FNA 2012 benign goiter) with ultrasounds and have been stable, most recently 01/10/13.    . H/O adenomatous polyp of colon 2012; 05/2016   5 yr recall  . Hyperlipidemia   . Hypertension   . Lumbar spondylolysis 2016   L5: with grade I spondylolisthesis L5 on S1  . Osteoporosis 2012; 2018   2012-"penia".  2018 "porosis"--alendronate started 12/2016.  Repeat DEXA 12/2018.  Marland Kitchen PFO (patent foramen ovale) echo 03/21/05   "hole in heart" saw Dr. Sharee Holster [phone] [facility]. hospital.  Plavix med mgmt.  Dr. Johnsie Cancel repeated echo w/bubble study 05/2016 and it showed NO PFO or ASD.  Marland Kitchen Subclinical hypothyroidism    Dr. Chalmers Cater has her on 25 mcg synthroid qd as of 01/2014  . TIA (transient ischemic attack)    patient on plavix,  saw Dr. Isabell Jarvis at [facility].hospital in [place].  MRI brain 05/2016: mild chronic small vessel dz, o/w normal.  . Urinary tract infection    hx of  . Vertigo     Past Surgical History:  Procedure Laterality Date  . ABDOMINAL HYSTERECTOMY    .  BLADDER SUSPENSION    . BREAST EXCISIONAL BIOPSY Right 1960`   x2  . BREAST SURGERY     "breast lumps removed"  . COLONOSCOPY W/ POLYPECTOMY  04/2011; 05/29/16   +Diverticulosis and int hem.  Adenomatous polyp 2012.  No polyps on repeat TCS 05/2016--recall 5 yrs (05/2021)  . DEXA  05/23/2011   2012 T-score -2.2. 12/2016 T-score -2.6  . DILATION AND CURETTAGE OF UTERUS    . diverticulosis  05/2016   Noted on colonoscopy  . RECTOCELE REPAIR     purse string  . THYROIDECTOMY     Left lobectomy  . TOTAL KNEE ARTHROPLASTY  07/26/2012   Procedure: TOTAL KNEE ARTHROPLASTY;  Surgeon: Yvette Rack., MD;  Location: [facility];  Service: Orthopedics;  Laterality: Right;  .  TRANSTHORACIC ECHOCARDIOGRAM  05/24/2016   EF 60-65%, normal LV function.  No DD. No PFO or ASD (bubble study WAS done).    Outpatient Medications Prior to Visit  Medication Sig Dispense Refill  . alendronate (FOSAMAX) 70 MG tablet Take 1 tablet (70 mg total) by mouth every 7 (seven) days. Take with a full glass of water on an empty stomach. 4 tablet 11  . amLODipine (NORVASC) 5 MG tablet Take 1 tablet (5 mg total) by mouth daily. 90 tablet 1  . aspirin EC 81 MG tablet Take 1 tablet (81 mg total) by mouth daily. 30 tablet 0  . atorvastatin (LIPITOR) 20 MG tablet TAKE 1 TABLET BY MOUTH ONCE DAILY 90 tablet 1  . busPIRone (BUSPAR) 10 MG tablet Take 1 tablet (10 mg total) by mouth 2 (two) times daily. 180 tablet 0  . clonazePAM (KLONOPIN) 0.5 MG tablet 1-2 tabs po qhs prn insomnia 30 tablet 1  . levothyroxine (SYNTHROID, LEVOTHROID) 50 MCG tablet Take 1 tablet (50 mcg total) by mouth daily before breakfast. 30 tablet 6  . losartan (COZAAR) 100 MG tablet TAKE 1 TABLET BY MOUTH ONCE DAILY 90 tablet 0  . Multiple Vitamin (MV-ONE) CAPS Take 1 capsule by mouth daily.    . Omega-3 Fatty Acids (FISH OIL) 500 MG CAPS Take 1 capsule by mouth daily.    Marland Kitchen venlafaxine XR (EFFEXOR-XR) 75 MG 24 hr capsule TAKE ONE CAPSULE BY MOUTH DAILY WITH BREAKFAST. 90 capsule 0  . lithium 300 MG tablet Take 1 tablet (300 mg total) by mouth 2 (two) times daily. (Patient not taking: Reported on 10/22/2017) 120 tablet 0   No facility-administered medications prior to visit.     Allergies  Allergen Reactions  . Phenylephrine Rash    Patient had allergic reaction to dilation combo drop. Please dilate with Tropicamide 0.5% only with Fluress and/or Proparacaine   . Sulfa Drugs Cross Reactors Rash    ROS As per HPI  PE: Blood pressure 132/80, pulse 77, temperature 98 F (36.7 C), temperature source Oral, resp. rate 16, height 5' 1.5" (1.562 m), weight 156 lb (70.8 kg), SpO2 100 %. Gen: Alert, well appearing.  Patient is  oriented to person, place, time, and situation. AFFECT: pleasant, lucid thought and speech, anxious. RDE:YCXK: no injection, icteris, swelling, or exudate.  EOMI, PERRLA. Mouth: lips without lesion/swelling.  Oral mucosa pink and moist. Oropharynx without erythema, exudate, or swelling.  CV: RRR, no m/r/g.   LUNGS: CTA bilat, nonlabored resps, good aeration in all lung fields. EXT: no clubbing, cyanosis, or edema.  Neuro: CN 2-12 intact bilaterally, strength 5/5 in proximal and distal upper extremities and lower extremities bilaterally.  No tremor.  FNF and  heel-shin-ankle normal bilat.  No ataxia or gait abnormality.  Upper extremity DTRS: biceps 2+ on R, 1+ on L.  Triceps and brachioradialis trace bilat. LE DTRs: 3+ patellar on R, 2+ patellar on L.  Achilles areflexic bilat.  No pronator drift.   LABS:   Lab Results  Component Value Date   TSH 1.01 09/26/2017      Chemistry      Component Value Date/Time   NA 142 09/26/2017 0940   K 4.3 09/26/2017 0940   CL 106 09/26/2017 0940   CO2 29 09/26/2017 0940   BUN 15 09/26/2017 0940   CREATININE 1.19 09/26/2017 0940   CREATININE 0.88 07/02/2015 0001      Component Value Date/Time   CALCIUM 10.0 09/26/2017 0940   ALKPHOS 67 09/26/2017 0940   AST 12 09/26/2017 0940   ALT 14 09/26/2017 0940   BILITOT 1.0 09/26/2017 0940     09/26/17 GFR= 47 ml/min  IMPRESSION AND PLAN:  Recurrent MDD, severe anxiety with PTSD, insomnia: all have been made worse by recent trial of lithium. She has slowly made some improvements since d/c of lithium about 5d/a.   Given symptoms of shuffling gait, dysarthria, and asymmetric DTRs (all sx's potentially suggestive of structural brain lesion), will check MRI brain w and w/out contrast to compare to the one she had in 2017.  Plan:  CBC w/diff, CMET, TSH, T4, T3, lithium level, and MRI brain w and w/out contrast. Information given on [facility] today. I really don't have any additional med  recommendations at this time---I told patient that her treatment is now beyond my level of expertise. Any further psych med mgmt needs to come from a psychiatrist (last psychiatrist she saw was Dr. De Nurse at [facility] in 04/2015 and pt states she does not want to return to this particular MD, but is agreeable to any other psychiatrist at this time). Of note, pt was referred to neurology (Dr. Carles Collet) by her cardiologist (Dr. Johnsie Cancel) back in 05/2016 b/c he felt like she needed neuropsychiatric testing---but she did not go to this.    Will see her back in 10d, refer to psychiatrist OR ask [facility] to do neuropsych testing at that time as long as her lithium side effects continue to clear up and her MRI is unremarkable.  Spent 40 min with pt today, with >50% of this time spent in counseling and care coordination regarding the above problems.  An After Visit Summary was printed and given to the patient.  FOLLOW UP: Return in about 10 days (around 11/01/2017) for 30 min psych f/u.  Signed:  Crissie Sickles, MD           10/22/2017

## 2017-10-23 LAB — T4, FREE: FREE T4: 1.24 ng/dL (ref 0.60–1.60)

## 2017-10-23 LAB — TSH: TSH: 2.08 u[IU]/mL (ref 0.35–4.50)

## 2017-10-24 ENCOUNTER — Encounter: Payer: Self-pay | Admitting: Family Medicine

## 2017-10-24 LAB — LITHIUM LEVEL: Lithium Lvl: 0.3 mmol/L — ABNORMAL LOW (ref 0.6–1.2)

## 2017-10-24 LAB — T3: T3, Total: 94 ng/dL (ref 76–181)

## 2017-10-26 DIAGNOSIS — E049 Nontoxic goiter, unspecified: Secondary | ICD-10-CM | POA: Diagnosis not present

## 2017-10-26 DIAGNOSIS — E039 Hypothyroidism, unspecified: Secondary | ICD-10-CM | POA: Diagnosis not present

## 2017-10-27 ENCOUNTER — Ambulatory Visit (HOSPITAL_BASED_OUTPATIENT_CLINIC_OR_DEPARTMENT_OTHER)
Admission: RE | Admit: 2017-10-27 | Discharge: 2017-10-27 | Disposition: A | Discharge status: Home / Self Care | Payer: Medicare Other | Source: Ambulatory Visit | Attending: Family Medicine | Admitting: Family Medicine

## 2017-10-27 DIAGNOSIS — I6782 Cerebral ischemia: Secondary | ICD-10-CM | POA: Insufficient documentation

## 2017-10-27 DIAGNOSIS — R471 Dysarthria and anarthria: Secondary | ICD-10-CM | POA: Insufficient documentation

## 2017-10-27 DIAGNOSIS — G319 Degenerative disease of nervous system, unspecified: Secondary | ICD-10-CM | POA: Diagnosis not present

## 2017-10-27 DIAGNOSIS — F333 Major depressive disorder, recurrent, severe with psychotic symptoms: Secondary | ICD-10-CM | POA: Insufficient documentation

## 2017-10-27 DIAGNOSIS — R2689 Other abnormalities of gait and mobility: Secondary | ICD-10-CM | POA: Diagnosis not present

## 2017-10-27 DIAGNOSIS — R479 Unspecified speech disturbances: Secondary | ICD-10-CM | POA: Diagnosis not present

## 2017-10-27 MED ORDER — GADOBENATE DIMEGLUMINE 529 MG/ML IV SOLN
10.0000 mL | Freq: Once | INTRAVENOUS | Status: AC | PRN
  Administered 2017-10-27: 10 mL via INTRAVENOUS

## 2017-10-28 ENCOUNTER — Encounter: Payer: Self-pay | Admitting: Family Medicine

## 2017-11-01 ENCOUNTER — Ambulatory Visit: Payer: Medicare Other | Admitting: Family Medicine

## 2017-11-01 ENCOUNTER — Ambulatory Visit (INDEPENDENT_AMBULATORY_CARE_PROVIDER_SITE_OTHER): Payer: Medicare Other | Admitting: Family Medicine

## 2017-11-01 ENCOUNTER — Encounter: Payer: Self-pay | Admitting: Family Medicine

## 2017-11-01 VITALS — BP 134/79 | HR 58 | Resp 16 | Wt 159.0 lb

## 2017-11-01 DIAGNOSIS — F4310 Post-traumatic stress disorder, unspecified: Secondary | ICD-10-CM | POA: Diagnosis not present

## 2017-11-01 DIAGNOSIS — F331 Major depressive disorder, recurrent, moderate: Secondary | ICD-10-CM

## 2017-11-01 DIAGNOSIS — T50905D Adverse effect of unspecified drugs, medicaments and biological substances, subsequent encounter: Secondary | ICD-10-CM

## 2017-11-01 DIAGNOSIS — T56891D Toxic effect of other metals, accidental (unintentional), subsequent encounter: Secondary | ICD-10-CM | POA: Diagnosis not present

## 2017-11-01 DIAGNOSIS — F411 Generalized anxiety disorder: Secondary | ICD-10-CM

## 2017-11-01 NOTE — Progress Notes (Signed)
OFFICE VISIT  11/01/2017   CC:  Chief Complaint  Patient presents with  . Follow-up    Psych    HPI:    Patient is a 77 y.o. Caucasian female who presents accompanied by her husband for 10 day f/u severe MDD with severe GAD/PTSD, with recent illness consistent with lithium toxicity. MRI brain since last visit showed no change in mild chronic small vessels ischemic changes, no other abnormality. As long as her sx's of lithium toxicity were clearing appropriately, our plan going forward was to refer to psychiatrist and ask [facility] to do neuropsych testing. No new meds were started.  I gave her and her husband information about [facility] at last visit. Has also been intolerant of lamictal. Use of citalopram, bupropion, pristiq, and ritalin have not led to any improvement OR have not been tolerated in the past.  Reports she is gradually improving regarding her tremulousness, walking instability/shuffling, cognitive/memory dysfunction, crying spells, severe irritability and anxiety with anger.  Eating and drinking fine. No focal or generalized weakness. Speech is improved in clarity.  NO GI or urinary complaints.  No HAs.  No acute vision or hearing complaints.   Past Medical History:  Diagnosis Date  . Anxiety and depression    with PTSD.  Dr. Johnsie Cancel ordered repeat MRI brain 2017 (no acute, no change from prior MR done in 2012) and referred pt to neuro for neuropsych testing but pt did not go.  Repeat MRI 10/2017 mild chronic microvasc isch change --stable compared to prior MRI.  Marland Kitchen Arthritis   . Bilateral carpal tunnel syndrome   . Cataracts, bilateral   . Chronic renal insufficiency, stage II (mild) 2015   CrCl about 60 ml/min  . Colitis    hx of  . DDD (degenerative disc disease), lumbar 2016   L1-2 and L5-S1 fairly severe--intramuscular injection of steroid and toradol by Dr. Antonietta Jewel 02/24/14 to calm down the pain  . Enterocele 05/2016   Dx'd by Dr. Toney Rakes (GYN); he  referred pt back to the MD at [facility] that did her prior bladder sling procedure.  No rectocele.  Marland Kitchen GERD (gastroesophageal reflux disease)   . Goiter    Lobectomy (left) of thyroid for benign nodule.  Right lobe nodules followed by Dr. Chalmers Cater (FNA 2012 benign goiter) with ultrasounds and have been stable, most recently 01/10/13.    . H/O adenomatous polyp of colon 2012; 05/2016   5 yr recall  . Hypercalcemia 10/2017   suspected to be due to lithium toxicity.  . Hyperlipidemia   . Hypertension   . Lithium toxicity 2019  . Lumbar spondylolysis 2016   L5: with grade I spondylolisthesis L5 on S1  . Osteoporosis 2012; 2018   2012-"penia".  2018 "porosis"--alendronate started 12/2016.  Repeat DEXA 12/2018.  Marland Kitchen PFO (patent foramen ovale) echo 03/21/05   "hole in heart" saw Dr. Sharee Holster [phone] [facility]. hospital.  Plavix med mgmt.  Dr. Johnsie Cancel repeated echo w/bubble study 05/2016 and it showed NO PFO or ASD.  Marland Kitchen Subclinical hypothyroidism    Dr. Chalmers Cater has her on 25 mcg synthroid qd as of 01/2014  . TIA (transient ischemic attack)    patient on plavix,  saw Dr. Isabell Jarvis at [facility].hospital in [place].  MRI brain 05/2016: mild chronic small vessel dz, o/w normal.  . Urinary tract infection    hx of  . Vertigo     Past Surgical History:  Procedure Laterality Date  . ABDOMINAL HYSTERECTOMY    .  BLADDER SUSPENSION    . BREAST EXCISIONAL BIOPSY Right 1960`   x2  . BREAST SURGERY     "breast lumps removed"  . COLONOSCOPY W/ POLYPECTOMY  04/2011; 05/29/16   +Diverticulosis and int hem.  Adenomatous polyp 2012.  No polyps on repeat TCS 05/2016--recall 5 yrs (05/2021)  . DEXA  05/23/2011   2012 T-score -2.2. 12/2016 T-score -2.6  . DILATION AND CURETTAGE OF UTERUS    . diverticulosis  05/2016   Noted on colonoscopy  . RECTOCELE REPAIR     purse string  . THYROIDECTOMY     Left lobectomy  . TOTAL KNEE ARTHROPLASTY  07/26/2012   Procedure: TOTAL KNEE ARTHROPLASTY;  Surgeon: Yvette Rack., MD;  Location: [facility];  Service: Orthopedics;  Laterality: Right;  . TRANSTHORACIC ECHOCARDIOGRAM  05/24/2016   EF 60-65%, normal LV function.  No DD. No PFO or ASD (bubble study WAS done).    Outpatient Medications Prior to Visit  Medication Sig Dispense Refill  . alendronate (FOSAMAX) 70 MG tablet Take 1 tablet (70 mg total) by mouth every 7 (seven) days. Take with a full glass of water on an empty stomach. 4 tablet 11  . amLODipine (NORVASC) 5 MG tablet Take 1 tablet (5 mg total) by mouth daily. 90 tablet 1  . aspirin EC 81 MG tablet Take 1 tablet (81 mg total) by mouth daily. 30 tablet 0  . atorvastatin (LIPITOR) 20 MG tablet TAKE 1 TABLET BY MOUTH ONCE DAILY 90 tablet 1  . busPIRone (BUSPAR) 10 MG tablet Take 1 tablet (10 mg total) by mouth 2 (two) times daily. 180 tablet 0  . clonazePAM (KLONOPIN) 0.5 MG tablet 1-2 tabs po qhs prn insomnia 30 tablet 1  . levothyroxine (SYNTHROID, LEVOTHROID) 50 MCG tablet Take 1 tablet (50 mcg total) by mouth daily before breakfast. 30 tablet 6  . losartan (COZAAR) 100 MG tablet TAKE 1 TABLET BY MOUTH ONCE DAILY 90 tablet 0  . Multiple Vitamin (MV-ONE) CAPS Take 1 capsule by mouth daily.    . Omega-3 Fatty Acids (FISH OIL) 500 MG CAPS Take 1 capsule by mouth daily.    Marland Kitchen venlafaxine XR (EFFEXOR-XR) 75 MG 24 hr capsule TAKE ONE CAPSULE BY MOUTH DAILY WITH BREAKFAST. 90 capsule 0   No facility-administered medications prior to visit.     Allergies  Allergen Reactions  . Phenylephrine Rash    Patient had allergic reaction to dilation combo drop. Please dilate with Tropicamide 0.5% only with Fluress and/or Proparacaine   . Sulfa Drugs Cross Reactors Rash    ROS As per HPI  PE: Blood pressure 134/79, pulse (!) 58, resp. rate 16, weight 159 lb (72.1 kg), SpO2 99 %. Gen: Alert, well appearing.  Patient is oriented to person, place, time, and situation. AFFECT: anxious, some depressed affect and a bit of crying.  Frustration and anger at  times. Lucid thought and speech, no dysarthria. No further exam today.  LABS:    Chemistry      Component Value Date/Time   NA 143 10/22/2017 1352   K 4.6 10/22/2017 1352   CL 105 10/22/2017 1352   CO2 29 10/22/2017 1352   BUN 13 10/22/2017 1352   CREATININE 1.10 10/22/2017 1352   CREATININE 0.88 07/02/2015 0001      Component Value Date/Time   CALCIUM 12.1 (H) 10/22/2017 1352   ALKPHOS 76 10/22/2017 1352   AST 15 10/22/2017 1352   ALT 17 10/22/2017 1352   BILITOT 0.9 10/22/2017  1352      Lab Results  Component Value Date   LITHIUM 0.3 (L) 10/22/2017   NA 143 10/22/2017   BUN 13 10/22/2017   CREATININE 1.10 10/22/2017   TSH 2.08 10/22/2017   WBC 11.0 (H) 10/22/2017    IMPRESSION AND PLAN:  Lithium toxicity: although lithium level was normal prior to onset of her symptoms, she had many symptoms consistent with toxicity/side effect of lithium.  These are gradually clearing. Her depression and anxiety symptoms are improving some since last visit.  She says the [place] is not something she wants to consider at this time. She expresses a lot of hesitancy to be referred to psychiatrist as well as to getting neuropsych testing. She wants to hold off on these for now, even though I have made it clear that any further med changes/additions need to be done by psychiatrist, since I have reached the extent of my expertise in her mgmt. She understands this and just wants to continue with her current treatments and f/u in 1 mo for recheck.  Spent 25 min with pt today, with >50% of this time spent in counseling and care coordination regarding the above problems.  An After Visit Summary was printed and given to the patient.  FOLLOW UP: Return in about 1 month (around 11/29/2017) for routine chronic illness f/u (30 min).  Signed:  Crissie Sickles, MD           11/01/2017

## 2017-11-09 ENCOUNTER — Other Ambulatory Visit: Payer: Self-pay | Admitting: Family Medicine

## 2017-11-09 NOTE — Telephone Encounter (Signed)
Copied from [place] [id number]. Topic: Quick Communication - Rx Refill/Question >> Nov 09, 2017  4:57 PM Oliver Pila B wrote: Medication: clonazePAM (KLONOPIN) 0.5 MG tablet [[id number]]  Has the patient contacted their pharmacy? Yes.   (Agent: If no, request that the patient contact the pharmacy for the refill.) Preferred Pharmacy (with phone number or street name): [facility] [phone] Agent: Please be advised that RX refills may take up to 3 business days. We ask that you follow-up with your pharmacy.

## 2017-11-09 NOTE — Telephone Encounter (Signed)
Last OV: 09/26/17 PCP: [facility]: [place], [place], [place] - [place] [phone] (Phone) [phone] (Fax)

## 2017-11-12 NOTE — Telephone Encounter (Signed)
[facility]  RF request for clonazepam LOV: [id number] Next ov: [id number] Last written: 09/17/17 #30 w/ 1RF  Please advise. Thanks.

## 2017-11-12 NOTE — Telephone Encounter (Signed)
Copied from [place] [phone]. Topic: Quick Communication - Rx Refill/Question >> Nov 12, 2017 11:30 AM Cleaster Corin, NT wrote: Pt. Husband calling to check on med. Refill they are out of town and hasn't  heard anything as of yet. Checking on refill status for clonazePAM (KLONOPIN) 0.5 MG tablet [[id number]] pt. Can be reached at [phone].  Medication: clonazePAM (KLONOPIN) 0.5 MG tablet [[id number]] Has the patient contacted their pharmacy? yes (Agent: If no, request that the patient contact the pharmacy for the refill.) Preferred Pharmacy (with phone number or street name):[facility] [place], [place], [place] - [address] [place] [address] Phone: [phone] Fax: [phone]  Agent: Please be advised that RX refills may take up to 3 business days. We ask that you follow-up with your pharmacy.

## 2017-11-13 ENCOUNTER — Ambulatory Visit: Payer: Medicare Other | Admitting: Family Medicine

## 2017-11-13 MED ORDER — CLONAZEPAM 0.5 MG PO TABS: ORAL_TABLET | ORAL | 1 refills | Status: DC

## 2017-11-13 NOTE — Telephone Encounter (Signed)
Rx faxed.  Pts husband advised and voiced understanding, okay per DPR.

## 2017-11-29 ENCOUNTER — Encounter: Payer: Self-pay | Admitting: Family Medicine

## 2017-11-29 ENCOUNTER — Ambulatory Visit (INDEPENDENT_AMBULATORY_CARE_PROVIDER_SITE_OTHER): Payer: Medicare Other | Admitting: Family Medicine

## 2017-11-29 VITALS — BP 133/85 | HR 74 | Temp 98.0°F | Ht 61.5 in | Wt 155.2 lb

## 2017-11-29 DIAGNOSIS — F411 Generalized anxiety disorder: Secondary | ICD-10-CM

## 2017-11-29 DIAGNOSIS — F4310 Post-traumatic stress disorder, unspecified: Secondary | ICD-10-CM | POA: Diagnosis not present

## 2017-11-29 DIAGNOSIS — F331 Major depressive disorder, recurrent, moderate: Secondary | ICD-10-CM | POA: Diagnosis not present

## 2017-11-29 NOTE — Progress Notes (Signed)
OFFICE VISIT  11/29/2017   CC:  Chief Complaint  Patient presents with  . Follow-up    [facility]    HPI:    Patient is a 77 y.o. Caucasian female who presents for 1 mo f/u severe chronic anxiety, recurrent MDD, PTSD. At last visit she was improving regarding her sx's of lithium toxicity. She declined [place] therapy at that time.  I have repeatedly recommended a psychiatrist referral but she has declined. I have made it clear to her that I have reached the end of my expertise in management of her psychiatric problems. No med changes made last visit: clonaz, buspar, venlafaxine xr.  She finds the clonaz helpful for sleep most nights.  She continues to struggle with her same severe depression and chronic severe anxiety. She states that the crying is driving her crazy and she states she can't control it. Today, as per her usual, she doesn't recall the specifics of prior visit accurately.  In fact, she asked that "that information about me getting a bunch of lithium and taking it wrong" be taken out of her chart.  I told her than nothing like that has ever been written in her chart nor did I ever say such a statement to her.  Her husband agreed that pt's statement today is erroneous. She stammers frequently, asking some questions she has repeatedly asked such as " so where to we stand with all this stuff I'm going through" and "can anything be done to help my uncontrollable crying".   It seems like she acts as if I have not presented her options clearly, which I DEFINITELY HAVE (see past office notes). When I state yet again that I recommend she see a psychiatrist AND start seeing a psychologist, she expressed hesitancy and just turned to look at her husband.  She then said that her husband does not really agree.  Her husband then said that " I can help myself when I get like this, and she used to be able to but I guess she can't now".  She is apparently hesitant to proceed as recommended b/c her husband  looks upon the idea of getting help as a sign of weakness.    Past Medical History:  Diagnosis Date  . Anxiety and depression    with PTSD.  Dr. Johnsie Cancel ordered repeat MRI brain 2017 (no acute, no change from prior MR done in 2012) and referred pt to neuro for neuropsych testing but pt did not go.  Repeat MRI 10/2017 mild chronic microvasc isch change --stable compared to prior MRI.  Marland Kitchen Arthritis   . Bilateral carpal tunnel syndrome   . Cataracts, bilateral   . Chronic renal insufficiency, stage II (mild) 2015   CrCl about 60 ml/min  . Colitis    hx of  . DDD (degenerative disc disease), lumbar 2016   L1-2 and L5-S1 fairly severe--intramuscular injection of steroid and toradol by Dr. Antonietta Jewel 02/24/14 to calm down the pain  . Enterocele 05/2016   Dx'd by Dr. Toney Rakes (GYN); he referred pt back to the MD at [facility] that did her prior bladder sling procedure.  No rectocele.  Marland Kitchen GERD (gastroesophageal reflux disease)   . Goiter    Lobectomy (left) of thyroid for benign nodule.  Right lobe nodules followed by Dr. Chalmers Cater (FNA 2012 benign goiter) with ultrasounds and have been stable, most recently 01/10/13.    . H/O adenomatous polyp of colon 2012; 05/2016   5 yr recall  . Hypercalcemia 10/2017  suspected to be due to lithium toxicity.  . Hyperlipidemia   . Hypertension   . Lithium toxicity 2019  . Lumbar spondylolysis 2016   L5: with grade I spondylolisthesis L5 on S1  . Osteoporosis 2012; 2018   2012-"penia".  2018 "porosis"--alendronate started 12/2016.  Repeat DEXA 12/2018.  Marland Kitchen PFO (patent foramen ovale) echo 03/21/05   "hole in heart" saw Dr. Sharee Holster [phone] [facility] hospital.  Plavix med mgmt.  Dr. Johnsie Cancel repeated echo w/bubble study 05/2016 and it showed NO PFO or ASD.  Marland Kitchen Subclinical hypothyroidism    Dr. Chalmers Cater has her on 25 mcg synthroid qd as of 01/2014  . TIA (transient ischemic attack)    patient on plavix,  saw Dr. Isabell Jarvis at [facility].hospital in [place].  MRI brain  05/2016: mild chronic small vessel dz, o/w normal.  . Urinary tract infection    hx of  . Vertigo     Past Surgical History:  Procedure Laterality Date  . ABDOMINAL HYSTERECTOMY    . BLADDER SUSPENSION    . BREAST EXCISIONAL BIOPSY Right 1960`   x2  . BREAST SURGERY     "breast lumps removed"  . COLONOSCOPY W/ POLYPECTOMY  04/2011; 05/29/16   +Diverticulosis and int hem.  Adenomatous polyp 2012.  No polyps on repeat TCS 05/2016--recall 5 yrs (05/2021)  . DEXA  05/23/2011   2012 T-score -2.2. 12/2016 T-score -2.6  . DILATION AND CURETTAGE OF UTERUS    . diverticulosis  05/2016   Noted on colonoscopy  . RECTOCELE REPAIR     purse string  . THYROIDECTOMY     Left lobectomy  . TOTAL KNEE ARTHROPLASTY  07/26/2012   Procedure: TOTAL KNEE ARTHROPLASTY;  Surgeon: Yvette Rack., MD;  Location: [place];  Service: Orthopedics;  Laterality: Right;  . TRANSTHORACIC ECHOCARDIOGRAM  05/24/2016   EF 60-65%, normal LV function.  No DD. No PFO or ASD (bubble study WAS done).    Outpatient Medications Prior to Visit  Medication Sig Dispense Refill  . alendronate (FOSAMAX) 70 MG tablet Take 1 tablet (70 mg total) by mouth every 7 (seven) days. Take with a full glass of water on an empty stomach. 4 tablet 11  . amLODipine (NORVASC) 5 MG tablet Take 1 tablet (5 mg total) by mouth daily. 90 tablet 1  . aspirin EC 81 MG tablet Take 1 tablet (81 mg total) by mouth daily. 30 tablet 0  . atorvastatin (LIPITOR) 20 MG tablet TAKE 1 TABLET BY MOUTH ONCE DAILY 90 tablet 1  . busPIRone (BUSPAR) 10 MG tablet Take 1 tablet (10 mg total) by mouth 2 (two) times daily. 180 tablet 0  . clonazePAM (KLONOPIN) 0.5 MG tablet 1-2 tabs po qhs prn insomnia 60 tablet 1  . levothyroxine (SYNTHROID, LEVOTHROID) 50 MCG tablet Take 1 tablet (50 mcg total) by mouth daily before breakfast. 30 tablet 6  . losartan (COZAAR) 100 MG tablet TAKE 1 TABLET BY MOUTH ONCE DAILY 90 tablet 0  . Multiple Vitamin (MV-ONE) CAPS Take 1  capsule by mouth daily.    . Omega-3 Fatty Acids (FISH OIL) 500 MG CAPS Take 1 capsule by mouth daily.    Marland Kitchen venlafaxine XR (EFFEXOR-XR) 75 MG 24 hr capsule TAKE ONE CAPSULE BY MOUTH DAILY WITH BREAKFAST. 90 capsule 0   No facility-administered medications prior to visit.     Allergies  Allergen Reactions  . Phenylephrine Rash    Patient had allergic reaction to dilation combo drop. Please dilate with  Tropicamide 0.5% only with Fluress and/or Proparacaine   . Sulfa Drugs Cross Reactors Rash    ROS As per HPI  PE: Blood pressure 133/85, pulse 74, temperature 98 F (36.7 C), temperature source Oral, height 5' 1.5" (1.562 m), weight 155 lb 3.2 oz (70.4 kg), SpO2 95 %. Gen: Alert, well appearing.  Patient is oriented to person, place, time, and situation. AFFECT: anxious, frustrated, sometimes just grimaces when she attempts to speak or remember something. No further exam done today.   LABS:    Chemistry      Component Value Date/Time   NA 143 10/22/2017 1352   K 4.6 10/22/2017 1352   CL 105 10/22/2017 1352   CO2 29 10/22/2017 1352   BUN 13 10/22/2017 1352   CREATININE 1.10 10/22/2017 1352   CREATININE 0.88 07/02/2015 0001      Component Value Date/Time   CALCIUM 12.1 (H) 10/22/2017 1352   ALKPHOS 76 10/22/2017 1352   AST 15 10/22/2017 1352   ALT 17 10/22/2017 1352   BILITOT 0.9 10/22/2017 1352     Lab Results  Component Value Date   WBC 11.0 (H) 10/22/2017   HGB 14.3 10/22/2017   HCT 43.1 10/22/2017   MCV 89.9 10/22/2017   PLT 245.0 10/22/2017   Lab Results  Component Value Date   TSH 2.08 10/22/2017   IMPRESSION AND PLAN:  Severe recurrent MDD. Severe chronic anxiety. PTSD.  This is a very difficult situation b/c I have made it abundantly clear what my recommendations are but pt and husband are hesitant to carry through with them.  These are what I feel are very reasonable recommendations considering how she is suffering so bad with mental illness, and  considering the fact that I have repeatedly stated that I have reached the limit of my expertise in regard to treating her mental disorders.  We finally agreed that starting with just a counseling referral would be a good middle ground, and they'll continue to consider my recommendation of psychiatrist referral as time goes on. Referral to Dr. Gaynell Face ordered today.  Spent 25 min with pt today, with >50% of this time spent in counseling and care coordination regarding the above problems.  An After Visit Summary was printed and given to the patient.  FOLLOW UP: Return in about 2 months (around 01/29/2018) for f/u anx/dep--30 min.  Signed:  Crissie Sickles, MD           11/29/2017

## 2017-12-13 ENCOUNTER — Other Ambulatory Visit: Payer: Self-pay | Admitting: Family Medicine

## 2017-12-23 ENCOUNTER — Other Ambulatory Visit: Payer: Self-pay | Admitting: Family Medicine

## 2017-12-29 ENCOUNTER — Ambulatory Visit (HOSPITAL_COMMUNITY)
Admission: EM | Admit: 2017-12-29 | Discharge: 2017-12-29 | Disposition: A | Discharge status: Home / Self Care | Payer: Medicare Other | Attending: Internal Medicine | Admitting: Internal Medicine

## 2017-12-29 ENCOUNTER — Other Ambulatory Visit: Payer: Self-pay

## 2017-12-29 ENCOUNTER — Encounter (HOSPITAL_COMMUNITY): Payer: Self-pay | Admitting: *Deleted

## 2017-12-29 DIAGNOSIS — J069 Acute upper respiratory infection, unspecified: Secondary | ICD-10-CM | POA: Diagnosis not present

## 2017-12-29 MED ORDER — BENZONATATE 100 MG PO CAPS: 100.0000 mg | ORAL_CAPSULE | Freq: Three times a day (TID) | ORAL | 0 refills | Status: DC

## 2017-12-29 MED ORDER — FLUTICASONE PROPIONATE 50 MCG/ACT NA SUSP: 1.0000 | Freq: Every day | NASAL | 2 refills | Status: DC

## 2017-12-29 MED ORDER — ACETAMINOPHEN 325 MG PO TABS
975.0000 mg | ORAL_TABLET | Freq: Once | ORAL | Status: AC
  Administered 2017-12-29: 975 mg via ORAL

## 2017-12-29 MED ORDER — CETIRIZINE HCL 10 MG PO TABS: 10.0000 mg | ORAL_TABLET | Freq: Every day | ORAL | 0 refills | Status: DC

## 2017-12-29 MED ORDER — ACETAMINOPHEN 325 MG PO TABS
ORAL_TABLET | ORAL | Status: AC
  Filled 2017-12-29: qty 3

## 2017-12-29 NOTE — ED Triage Notes (Signed)
Diarrhea, headache, stomach pain, loss voice, leg pain, running fever, per pt she thinks she got it from her grandson when they came to visit.

## 2017-12-29 NOTE — Discharge Instructions (Signed)
Push fluids to ensure adequate hydration and keep secretions thin.  Tylenol and/or ibuprofen as needed for pain or fevers.  Use of dailiy zyrtec to help with congestion as well as daily nasal spray. May use tessalon capsules every 8 hours as needed or use of over the counter Mucinex. Throat lozenges, garlges, warm teas or honey can be helpful with sore throat.  If symptoms worsen or do not improve in the next week to return to be seen or to follow up with your PCP.

## 2017-12-29 NOTE — ED Provider Notes (Signed)
Carla Spencer    CSN: [id number] Arrival date & time: 12/29/17  1215     History   Chief Complaint No chief complaint on file.   HPI Carla Spencer is a 77 y.o. female.   Carla Spencer presents with her husband with complaints of sore throat, dry cough, body aches, congestion and facial pressure which started two days ago. States three days ago she had diarrhea, incontinent even, which has since resolved. Mild nausea. Has been able to eat toast, has been drinking fluids. Her grandson visited prior to symptoms onset and he had cough and illness. Normal urination. Has not taken any medications for symptoms. Denies shortness of breath  Or chest pain . No known fevers. Hx anxiety/depression, arthritis, chronic renal insufficiency, DDD, gerd, goiter, htn.    ROS per HPI.      Past Medical History:  Diagnosis Date  . Anxiety and depression    with PTSD.  Dr. Johnsie Cancel ordered repeat MRI brain 2017 (no acute, no change from prior MR done in 2012) and referred pt to neuro for neuropsych testing but pt did not go.  Repeat MRI 10/2017 mild chronic microvasc isch change --stable compared to prior MRI.  Marland Kitchen Arthritis   . Bilateral carpal tunnel syndrome   . Cataracts, bilateral   . Chronic renal insufficiency, stage II (mild) 2015   CrCl about 60 ml/min  . Colitis    hx of  . DDD (degenerative disc disease), lumbar 2016   L1-2 and L5-S1 fairly severe--intramuscular injection of steroid and toradol by Dr. Antonietta Jewel 02/24/14 to calm down the pain  . Enterocele 05/2016   Dx'd by Dr. Toney Rakes (GYN); he referred pt back to the MD at [facility] that did her prior bladder sling procedure.  No rectocele.  Marland Kitchen GERD (gastroesophageal reflux disease)   . Goiter    Lobectomy (left) of thyroid for benign nodule.  Right lobe nodules followed by Dr. Chalmers Cater (FNA 2012 benign goiter) with ultrasounds and have been stable, most recently 01/10/13.    . H/O adenomatous polyp of colon 2012; 05/2016   5 yr recall  .  Hypercalcemia 10/2017   suspected to be due to lithium toxicity.  . Hyperlipidemia   . Hypertension   . Lithium toxicity 2019  . Lumbar spondylolysis 2016   L5: with grade I spondylolisthesis L5 on S1  . Osteoporosis 2012; 2018   2012-"penia".  2018 "porosis"--alendronate started 12/2016.  Repeat DEXA 12/2018.  Marland Kitchen PFO (patent foramen ovale) echo 03/21/05   "hole in heart" saw Dr. Sharee Holster [phone] [facility] hospital.  Plavix med mgmt.  Dr. Johnsie Cancel repeated echo w/bubble study 05/2016 and it showed NO PFO or ASD.  Marland Kitchen Subclinical hypothyroidism    Dr. Chalmers Cater has her on 25 mcg synthroid qd as of 01/2014  . TIA (transient ischemic attack)    patient on plavix,  saw Dr. Isabell Jarvis at [facility].hospital in [place].  MRI brain 05/2016: mild chronic small vessel dz, o/w normal.  . Urinary tract infection    hx of  . Vertigo     Patient Active Problem List   Diagnosis Date Noted  . Enterocele 05/22/2016  . Cystocele, midline 05/22/2016  . Medicare annual wellness visit, initial 02/18/2016  . PTSD (post-traumatic stress disorder) 04/07/2015  . Major depressive disorder, recurrent, severe without psychotic features ([place]) 04/07/2015  . Medication reaction 02/22/2015  . Breast pain, right 07/07/2013  . PFO (patent foramen ovale)   . Anxiety and depression   . Skin  lesion 01/28/2013  . Insomnia secondary to depression with anxiety 09/26/2012  . GAD (generalized anxiety disorder) 09/26/2012  . Major depressive disorder, recurrent episode, moderate ([place]) 09/26/2012  . Multiple thyroid nodules 09/26/2012  . Osteoarthritis of right knee 07/26/2012  . Memory loss 05/06/2012  . Benign hypertensive heart disease 05/21/2011  . Cystocele 05/21/2011  . Hypertension 04/04/2011  . Other and unspecified hyperlipidemia 03/19/2011  . Goiter 03/19/2011  . Osteoporosis 03/19/2011    Past Surgical History:  Procedure Laterality Date  . ABDOMINAL HYSTERECTOMY    . BLADDER SUSPENSION    . BREAST  EXCISIONAL BIOPSY Right 1960`   x2  . BREAST SURGERY     "breast lumps removed"  . COLONOSCOPY W/ POLYPECTOMY  04/2011; 05/29/16   +Diverticulosis and int hem.  Adenomatous polyp 2012.  No polyps on repeat TCS 05/2016--recall 5 yrs (05/2021)  . DEXA  05/23/2011   2012 T-score -2.2. 12/2016 T-score -2.6  . DILATION AND CURETTAGE OF UTERUS    . diverticulosis  05/2016   Noted on colonoscopy  . RECTOCELE REPAIR     purse string  . THYROIDECTOMY     Left lobectomy  . TOTAL KNEE ARTHROPLASTY  07/26/2012   Procedure: TOTAL KNEE ARTHROPLASTY;  Surgeon: Yvette Rack., MD;  Location: [place];  Service: Orthopedics;  Laterality: Right;  . TRANSTHORACIC ECHOCARDIOGRAM  05/24/2016   EF 60-65%, normal LV function.  No DD. No PFO or ASD (bubble study WAS done).    OB History    Gravida  4   Para  4   Term      Preterm      AB      Living  4     SAB      TAB      Ectopic      Multiple      Live Births               Home Medications    Prior to Admission medications   Medication Sig Start Date End Date Taking? Authorizing Provider  alendronate (FOSAMAX) 70 MG tablet Take 1 tablet (70 mg total) by mouth every 7 (seven) days. Take with a full glass of water on an empty stomach. 01/10/17   McGowen, Adrian Blackwater, MD  amLODipine (NORVASC) 5 MG tablet Take 1 tablet (5 mg total) by mouth daily. 09/17/17   McGowen, Adrian Blackwater, MD  aspirin EC 81 MG tablet Take 1 tablet (81 mg total) by mouth daily. 06/21/16   McGowen, Adrian Blackwater, MD  atorvastatin (LIPITOR) 20 MG tablet TAKE 1 TABLET BY MOUTH ONCE DAILY 12/24/17   McGowen, Adrian Blackwater, MD  benzonatate (TESSALON) 100 MG capsule Take 1 capsule (100 mg total) by mouth every 8 (eight) hours. 12/29/17   Zigmund Gottron, NP  busPIRone (BUSPAR) 10 MG tablet Take 1 tablet (10 mg total) by mouth 2 (two) times daily. 07/20/16   McGowen, Adrian Blackwater, MD  cetirizine (ZYRTEC) 10 MG tablet Take 1 tablet (10 mg total) by mouth daily. 12/29/17   Zigmund Gottron, NP    clonazePAM (KLONOPIN) 0.5 MG tablet 1-2 tabs po qhs prn insomnia 11/13/17   McGowen, Adrian Blackwater, MD  fluticasone (FLONASE) 50 MCG/ACT nasal spray Place 1 spray into both nostrils daily. 12/29/17   Zigmund Gottron, NP  levothyroxine (SYNTHROID, LEVOTHROID) 50 MCG tablet Take 1 tablet (50 mcg total) by mouth daily before breakfast. 08/25/15   McGowen, Adrian Blackwater, MD  losartan (COZAAR) 100 MG  tablet TAKE 1 TABLET BY MOUTH ONCE DAILY 12/13/17   McGowen, Adrian Blackwater, MD  Multiple Vitamin (MV-ONE) CAPS Take 1 capsule by mouth daily.    [provider]  Omega-3 Fatty Acids (FISH OIL) 500 MG CAPS Take 1 capsule by mouth daily.    [provider]  venlafaxine XR (EFFEXOR-XR) 75 MG 24 hr capsule TAKE 1 CAPSULE BY MOUTH DAILY WITH BREAKFAST 12/24/17   McGowen, Adrian Blackwater, MD    Family History Family History  Problem Relation Age of Onset  . Heart disease Mother   . Stomach cancer Father   . Cancer Father   . Colon polyps Daughter   . Diabetes Cousin   . Colon cancer Neg Hx     Social History Social History   Tobacco Use  . Smoking status: Former Research scientist (life sciences)  . Smokeless tobacco: Never Used  . Tobacco comment: smoked 1/4 of pack from [id number]  Substance Use Topics  . Alcohol use: No  . Drug use: No     Allergies   Phenylephrine and Sulfa drugs cross reactors   Review of Systems Review of Systems   Physical Exam Triage Vital Signs ED Triage Vitals  Enc Vitals Group     BP 12/29/17 1331 136/80     Pulse Rate 12/29/17 1331 75     Resp --      Temp 12/29/17 1331 98.3 F (36.8 C)     Temp Source 12/29/17 1331 Oral     SpO2 12/29/17 1331 100 %     Weight --      Height --      Head Circumference --      Peak Flow --      Pain Score 12/29/17 1335 8     Pain Loc --      Pain Edu? --      Excl. in [place]? --    No data found.  Updated Vital Signs BP 136/80 (BP Location: Left Arm)   Pulse 75   Temp 98.3 F (36.8 C) (Oral)   SpO2 100%    Physical Exam  Constitutional:  She is oriented to person, place, and time. She appears well-developed and well-nourished. No distress.  HENT:  Head: Normocephalic and atraumatic.  Right Ear: Tympanic membrane, external ear and ear canal normal.  Left Ear: Tympanic membrane, external ear and ear canal normal.  Nose: Rhinorrhea present. Right sinus exhibits no maxillary sinus tenderness and no frontal sinus tenderness. Left sinus exhibits no maxillary sinus tenderness and no frontal sinus tenderness.  Mouth/Throat: Uvula is midline, oropharynx is clear and moist and mucous membranes are normal. No tonsillar exudate.  Eyes: Pupils are equal, round, and reactive to light. Conjunctivae and EOM are normal.  Cardiovascular: Normal rate, regular rhythm and normal heart sounds.  Pulmonary/Chest: Effort normal and breath sounds normal.  Occasional strong dry cough noted with mild hoarseness to voice   Abdominal: Soft. There is no tenderness.  Neurological: She is alert and oriented to person, place, and time.  Skin: Skin is warm and dry.     UC Treatments / Results  Labs (all labs ordered are listed, but only abnormal results are displayed) Labs Reviewed - No data to display  EKG None  Radiology No results found.  Procedures Procedures (including critical care time)  Medications Ordered in UC Medications  acetaminophen (TYLENOL) tablet 975 mg (has no administration in time range)    Initial Impression / Assessment and Plan / UC Course  I have  reviewed the triage vital signs and the nursing notes.  Pertinent labs & imaging results that were available during my care of the patient were reviewed by me and considered in my medical decision making (see chart for details).     Afebrile. Symptoms for 2 days. Without tachycardia, tachypnea, hypoxia. Benign findings on physical exam. History and physical consistent with viral illness.  Continue with supportive cares. Return precautions provided. Patient verbalized  understanding and agreeable to plan.    Final Clinical Impressions(s) / UC Diagnoses   Final diagnoses:  Acute upper respiratory infection     Discharge Instructions     Push fluids to ensure adequate hydration and keep secretions thin.  Tylenol and/or ibuprofen as needed for pain or fevers.  Use of dailiy zyrtec to help with congestion as well as daily nasal spray. May use tessalon capsules every 8 hours as needed or use of over the counter Mucinex. Throat lozenges, garlges, warm teas or honey can be helpful with sore throat.  If symptoms worsen or do not improve in the next week to return to be seen or to follow up with your PCP.     ED Prescriptions    Medication Sig Dispense Auth. Provider   fluticasone (FLONASE) 50 MCG/ACT nasal spray Place 1 spray into both nostrils daily. 16 g Augusto Gamble B, NP   benzonatate (TESSALON) 100 MG capsule Take 1 capsule (100 mg total) by mouth every 8 (eight) hours. 21 capsule Augusto Gamble B, NP   cetirizine (ZYRTEC) 10 MG tablet Take 1 tablet (10 mg total) by mouth daily. 30 tablet Zigmund Gottron, NP     Controlled Substance Prescriptions [place] Controlled Substance Registry consulted? Not Applicable   Zigmund Gottron, NP 12/29/17 1407

## 2018-01-01 ENCOUNTER — Telehealth: Payer: Self-pay | Admitting: Family Medicine

## 2018-01-01 NOTE — Telephone Encounter (Signed)
SW Dr. Anitra Lauth, we can work pt in on Friday (01/04/18 at 1:15pm).   Pts husband advised and voiced understanding, apt made.

## 2018-01-01 NOTE — Telephone Encounter (Signed)
Patient was seen at [facility] on Saturday and diagnosed with uri.  Patient's husband requesting to make an appt with pcp as patient does seem to be getting better.   Patient was prescribed the following meds:  benzonatate (TESSALON) 100 MG capsule cetirizine (ZYRTEC) 10 MG tablet fluticasone (FLONASE) 50 MCG/ACT nasal spray  None of the meds have helped. An appt was offered with Dr. Raoul Pitch this week since pcp has no availability.  However, husband declined appt with any other physician, stating patient can only see pcp due to her complex medical history.  He wants to know if patient can be worked in with pcp this week or called if there is a cancellation.

## 2018-01-04 ENCOUNTER — Other Ambulatory Visit: Payer: Self-pay | Admitting: Endocrinology

## 2018-01-04 ENCOUNTER — Encounter: Payer: Self-pay | Admitting: Family Medicine

## 2018-01-04 ENCOUNTER — Ambulatory Visit (INDEPENDENT_AMBULATORY_CARE_PROVIDER_SITE_OTHER): Payer: Medicare Other | Admitting: Family Medicine

## 2018-01-04 VITALS — BP 93/60 | HR 87 | Temp 98.6°F | Resp 16 | Ht 61.5 in | Wt 152.4 lb

## 2018-01-04 DIAGNOSIS — J209 Acute bronchitis, unspecified: Secondary | ICD-10-CM

## 2018-01-04 DIAGNOSIS — J0100 Acute maxillary sinusitis, unspecified: Secondary | ICD-10-CM | POA: Diagnosis not present

## 2018-01-04 DIAGNOSIS — E049 Nontoxic goiter, unspecified: Secondary | ICD-10-CM

## 2018-01-04 DIAGNOSIS — H65112 Acute and subacute allergic otitis media (mucoid) (sanguinous) (serous), left ear: Secondary | ICD-10-CM

## 2018-01-04 MED ORDER — AZITHROMYCIN 250 MG PO TABS: ORAL_TABLET | ORAL | 0 refills | Status: DC

## 2018-01-04 MED ORDER — CLONAZEPAM 0.5 MG PO TABS: ORAL_TABLET | ORAL | 1 refills | Status: DC

## 2018-01-04 MED ORDER — PREDNISONE 20 MG PO TABS: ORAL_TABLET | ORAL | 0 refills | Status: DC

## 2018-01-04 MED ORDER — BENZONATATE 100 MG PO CAPS: 100.0000 mg | ORAL_CAPSULE | Freq: Three times a day (TID) | ORAL | 1 refills | Status: DC

## 2018-01-04 NOTE — Progress Notes (Signed)
OFFICE VISIT  01/04/2018   CC:  Chief Complaint  Patient presents with  . Follow-up    URI   HPI:    Patient is a 77 y.o. Caucasian female who presents accompanied by husband for ongoing respiratory complaints--"not improving". Was seen 6 d/a and dx'd with URI with cough.  Rx'd zyrtec, flonase, and tessalon. Lots of PND, losing voice, lots of coughing, some coughing fits.  NO SOB, no wheeze or chest tightness. No fevers.  Rattly, +some mucous production starting the last couple days. Sx's going on 8 days, not getting better.  Has sinus pressure and nasal congestion as well.    Recently around grandson who had cough---about several days prior to onset of her sx's.     Past Medical History:  Diagnosis Date  . Anxiety and depression    with PTSD.  Dr. Johnsie Cancel ordered repeat MRI brain 2017 (no acute, no change from prior MR done in 2012) and referred pt to neuro for neuropsych testing but pt did not go.  Repeat MRI 10/2017 mild chronic microvasc isch change --stable compared to prior MRI.  Marland Kitchen Arthritis   . Bilateral carpal tunnel syndrome   . Cataracts, bilateral   . Chronic renal insufficiency, stage II (mild) 2015   CrCl about 60 ml/min  . Colitis    hx of  . DDD (degenerative disc disease), lumbar 2016   L1-2 and L5-S1 fairly severe--intramuscular injection of steroid and toradol by Dr. Antonietta Jewel 02/24/14 to calm down the pain  . Enterocele 05/2016   Dx'd by Dr. Toney Rakes (GYN); he referred pt back to the MD at [facility] that did her prior bladder sling procedure.  No rectocele.  Marland Kitchen GERD (gastroesophageal reflux disease)   . Goiter    Lobectomy (left) of thyroid for benign nodule.  Right lobe nodules followed by Dr. Chalmers Cater (FNA 2012 benign goiter) with ultrasounds and have been stable, most recently 01/10/13.    . H/O adenomatous polyp of colon 2012; 05/2016   5 yr recall  . Hypercalcemia 10/2017   suspected to be due to lithium toxicity.  . Hyperlipidemia   . Hypertension   . Lithium  toxicity 2019  . Lumbar spondylolysis 2016   L5: with grade I spondylolisthesis L5 on S1  . Osteoporosis 2012; 2018   2012-"penia".  2018 "porosis"--alendronate started 12/2016.  Repeat DEXA 12/2018.  Marland Kitchen PFO (patent foramen ovale) echo 03/21/05   "hole in heart" saw Dr. Sharee Holster [phone] [facility]. hospital.  Plavix med mgmt.  Dr. Johnsie Cancel repeated echo w/bubble study 05/2016 and it showed NO PFO or ASD.  Marland Kitchen Subclinical hypothyroidism    Dr. Chalmers Cater has her on 25 mcg synthroid qd as of 01/2014  . TIA (transient ischemic attack)    patient on plavix,  saw Dr. Isabell Jarvis at [facility].hospital in [place].  MRI brain 05/2016: mild chronic small vessel dz, o/w normal.  . Urinary tract infection    hx of  . Vertigo     Past Surgical History:  Procedure Laterality Date  . ABDOMINAL HYSTERECTOMY    . BLADDER SUSPENSION    . BREAST EXCISIONAL BIOPSY Right 1960`   x2  . BREAST SURGERY     "breast lumps removed"  . COLONOSCOPY W/ POLYPECTOMY  04/2011; 05/29/16   +Diverticulosis and int hem.  Adenomatous polyp 2012.  No polyps on repeat TCS 05/2016--recall 5 yrs (05/2021)  . DEXA  05/23/2011   2012 T-score -2.2. 12/2016 T-score -2.6  . DILATION AND CURETTAGE OF UTERUS    .  diverticulosis  05/2016   Noted on colonoscopy  . RECTOCELE REPAIR     purse string  . THYROIDECTOMY     Left lobectomy  . TOTAL KNEE ARTHROPLASTY  07/26/2012   Procedure: TOTAL KNEE ARTHROPLASTY;  Surgeon: Yvette Rack., MD;  Location: [place];  Service: Orthopedics;  Laterality: Right;  . TRANSTHORACIC ECHOCARDIOGRAM  05/24/2016   EF 60-65%, normal LV function.  No DD. No PFO or ASD (bubble study WAS done).    Outpatient Medications Prior to Visit  Medication Sig Dispense Refill  . amLODipine (NORVASC) 5 MG tablet Take 1 tablet (5 mg total) by mouth daily. 90 tablet 1  . aspirin EC 81 MG tablet Take 1 tablet (81 mg total) by mouth daily. 30 tablet 0  . atorvastatin (LIPITOR) 20 MG tablet TAKE 1 TABLET BY MOUTH ONCE  DAILY 90 tablet 1  . busPIRone (BUSPAR) 10 MG tablet Take 1 tablet (10 mg total) by mouth 2 (two) times daily. 180 tablet 0  . cetirizine (ZYRTEC) 10 MG tablet Take 1 tablet (10 mg total) by mouth daily. 30 tablet 0  . fluticasone (FLONASE) 50 MCG/ACT nasal spray Place 1 spray into both nostrils daily. 16 g 2  . levothyroxine (SYNTHROID, LEVOTHROID) 50 MCG tablet Take 1 tablet (50 mcg total) by mouth daily before breakfast. 30 tablet 6  . losartan (COZAAR) 100 MG tablet TAKE 1 TABLET BY MOUTH ONCE DAILY 90 tablet 1  . Multiple Vitamin (MV-ONE) CAPS Take 1 capsule by mouth daily.    . Omega-3 Fatty Acids (FISH OIL) 500 MG CAPS Take 1 capsule by mouth daily.    Marland Kitchen venlafaxine XR (EFFEXOR-XR) 75 MG 24 hr capsule TAKE 1 CAPSULE BY MOUTH DAILY WITH BREAKFAST 90 capsule 1  . benzonatate (TESSALON) 100 MG capsule Take 1 capsule (100 mg total) by mouth every 8 (eight) hours. 21 capsule 0  . clonazePAM (KLONOPIN) 0.5 MG tablet 1-2 tabs po qhs prn insomnia 60 tablet 1  . alendronate (FOSAMAX) 70 MG tablet Take 1 tablet (70 mg total) by mouth every 7 (seven) days. Take with a full glass of water on an empty stomach. (Patient not taking: Reported on 01/04/2018) 4 tablet 11   No facility-administered medications prior to visit.     Allergies  Allergen Reactions  . Phenylephrine Rash    Patient had allergic reaction to dilation combo drop. Please dilate with Tropicamide 0.5% only with Fluress and/or Proparacaine   . Sulfa Drugs Cross Reactors Rash    ROS As per HPI  PE: Blood pressure 93/60, pulse 87, temperature 98.6 F (37 C), temperature source Oral, resp. rate 16, height 5' 1.5" (1.562 m), weight 152 lb 6 oz (69.1 kg), SpO2 91 %. Body mass index is 28.32 kg/m.  VS: noted--normal. Gen: alert, NAD, NONTOXIC APPEARING. HEENT: eyes without injection, drainage, or swelling.  Ears: EACs clear, TM on left with erythema and loss of landmarks.  R TM normal.  Nose: Clear rhinorrhea, with some dried,  crusty exudate adherent to mildly injected mucosa.  No purulent d/c.  Mild diffuse paranasal and frontal sinus TTP.  No facial swelling.  Throat and mouth without focal lesion.  No pharyngial swelling, erythema, or exudate.   Neck: supple, no LAD.   LUNGS: CTA bilat, nonlabored resps.   CV: RRR, no m/r/g. EXT: no c/c/e SKIN: no rash    LABS:    Chemistry      Component Value Date/Time   NA 143 10/22/2017 1352   K  4.6 10/22/2017 1352   CL 105 10/22/2017 1352   CO2 29 10/22/2017 1352   BUN 13 10/22/2017 1352   CREATININE 1.10 10/22/2017 1352   CREATININE 0.88 07/02/2015 0001      Component Value Date/Time   CALCIUM 12.1 (H) 10/22/2017 1352   ALKPHOS 76 10/22/2017 1352   AST 15 10/22/2017 1352   ALT 17 10/22/2017 1352   BILITOT 0.9 10/22/2017 1352       IMPRESSION AND PLAN:  Prolonged resp illness, not improving. She has signs of L AOM, acute sinusitis, and acute bronchitis w/out RAD. Plan: Z pack, prednisone 16d taper (40, 20, 10), and continue zyrtec, flonase, and tessalon pearls. She is having trouble sleeping so has been taking two of her clonaz hs lately, says she'll run out early and asks for new rx for this med today.  She is an EXTREMELY anxious person and I do NOT want her to run out of this med. Rx for clonaz 0.5mg , 1-2 tabs po qhs prn insomnia, #60, RF x1.  An After Visit Summary was printed and given to the patient.  FOLLOW UP: Return if symptoms worsen or fail to improve.  Signed:  Crissie Sickles, MD           01/04/2018

## 2018-01-29 ENCOUNTER — Other Ambulatory Visit: Payer: Self-pay

## 2018-01-29 DIAGNOSIS — H2513 Age-related nuclear cataract, bilateral: Secondary | ICD-10-CM | POA: Diagnosis not present

## 2018-01-29 DIAGNOSIS — H5203 Hypermetropia, bilateral: Secondary | ICD-10-CM | POA: Diagnosis not present

## 2018-01-30 ENCOUNTER — Ambulatory Visit (INDEPENDENT_AMBULATORY_CARE_PROVIDER_SITE_OTHER): Payer: Medicare Other | Admitting: Family Medicine

## 2018-01-30 ENCOUNTER — Ambulatory Visit (INDEPENDENT_AMBULATORY_CARE_PROVIDER_SITE_OTHER): Payer: Medicare Other | Admitting: Clinical

## 2018-01-30 ENCOUNTER — Encounter: Payer: Self-pay | Admitting: Family Medicine

## 2018-01-30 VITALS — BP 106/68 | HR 92 | Temp 98.3°F | Resp 16 | Ht 61.5 in | Wt 152.2 lb

## 2018-01-30 DIAGNOSIS — F411 Generalized anxiety disorder: Secondary | ICD-10-CM

## 2018-01-30 DIAGNOSIS — F4310 Post-traumatic stress disorder, unspecified: Secondary | ICD-10-CM | POA: Diagnosis not present

## 2018-01-30 DIAGNOSIS — E663 Overweight: Secondary | ICD-10-CM | POA: Diagnosis not present

## 2018-01-30 DIAGNOSIS — F331 Major depressive disorder, recurrent, moderate: Secondary | ICD-10-CM | POA: Diagnosis not present

## 2018-01-30 DIAGNOSIS — F332 Major depressive disorder, recurrent severe without psychotic features: Secondary | ICD-10-CM | POA: Diagnosis not present

## 2018-01-30 DIAGNOSIS — S46819A Strain of other muscles, fascia and tendons at shoulder and upper arm level, unspecified arm, initial encounter: Secondary | ICD-10-CM | POA: Diagnosis not present

## 2018-01-30 MED ORDER — VENLAFAXINE HCL ER 150 MG PO CP24: 150.0000 mg | ORAL_CAPSULE | Freq: Every day | ORAL | 2 refills | Status: DC

## 2018-01-30 NOTE — Patient Instructions (Signed)
Apply a heating pad to your right upper arm for 20 min twice a day until your pain has abated.  Take THREE over the counter strength (generic) ibuprofen TWICE per day with food for 7 days.

## 2018-01-30 NOTE — Progress Notes (Signed)
OFFICE VISIT  01/30/2018   CC:  Chief Complaint  Patient presents with  . Follow-up    Anxiety and Depression    HPI:    Patient is a 77 y.o. Caucasian female who presents for 2 mo f/u severe chronic anxiety, PTSD, and recurrent MDD. These things have been extremely treatment resistant.  R upper arm: pain x 1 week after lifting furniture.  Straitening it completely helps.  No paresthesias, no neck pain. No acute strain was felt. It is improving some over time.  Says she is no different from a standpoint of severe anxiety/depression. Still with sleep probs: goes to bed too early in evening, wakes up and can't go to sleep on time in order to sleep through the night.  She is working on keeping herself up in evenings to try to correct this some.  Has 1st appointment with Dr. Gaynell Face today.  Past Medical History:  Diagnosis Date  . Anxiety and depression    with PTSD.  Dr. Johnsie Cancel ordered repeat MRI brain 2017 (no acute, no change from prior MR done in 2012) and referred pt to neuro for neuropsych testing but pt did not go.  Repeat MRI 10/2017 mild chronic microvasc isch change --stable compared to prior MRI.  Marland Kitchen Arthritis   . Bilateral carpal tunnel syndrome   . Cataracts, bilateral   . Chronic renal insufficiency, stage II (mild) 2015   CrCl about 60 ml/min  . Colitis    hx of  . DDD (degenerative disc disease), lumbar 2016   L1-2 and L5-S1 fairly severe--intramuscular injection of steroid and toradol by Dr. Antonietta Jewel 02/24/14 to calm down the pain  . Enterocele 05/2016   Dx'd by Dr. Toney Rakes (GYN); he referred pt back to the MD at [facility] that did her prior bladder sling procedure.  No rectocele.  Marland Kitchen GERD (gastroesophageal reflux disease)   . Goiter    Lobectomy (left) of thyroid for benign nodule.  Right lobe nodules followed by Dr. Chalmers Cater (FNA 2012 benign goiter) with ultrasounds and have been stable, most recently 01/10/13.    . H/O adenomatous polyp of colon 2012; 05/2016   5 yr  recall  . Hypercalcemia 10/2017   suspected to be due to lithium toxicity.  . Hyperlipidemia   . Hypertension   . Lithium toxicity 2019  . Lumbar spondylolysis 2016   L5: with grade I spondylolisthesis L5 on S1  . Osteoporosis 2012; 2018   2012-"penia".  2018 "porosis"--alendronate started 12/2016.  Repeat DEXA 12/2018.  Marland Kitchen PFO (patent foramen ovale) echo 03/21/05   "hole in heart" saw Dr. Sharee Holster [phone] [facility] hospital.  Plavix med mgmt.  Dr. Johnsie Cancel repeated echo w/bubble study 05/2016 and it showed NO PFO or ASD.  Marland Kitchen Subclinical hypothyroidism    Dr. Chalmers Cater has her on 25 mcg synthroid qd as of 01/2014  . TIA (transient ischemic attack)    patient on plavix,  saw Dr. Isabell Jarvis at [facility].hospital in [place].  MRI brain 05/2016: mild chronic small vessel dz, o/w normal.  . Urinary tract infection    hx of  . Vertigo     Past Surgical History:  Procedure Laterality Date  . ABDOMINAL HYSTERECTOMY    . BLADDER SUSPENSION    . BREAST EXCISIONAL BIOPSY Right 1960`   x2  . BREAST SURGERY     "breast lumps removed"  . COLONOSCOPY W/ POLYPECTOMY  04/2011; 05/29/16   +Diverticulosis and int hem.  Adenomatous polyp 2012.  No polyps on repeat  TCS 05/2016--recall 5 yrs (05/2021)  . DEXA  05/23/2011   2012 T-score -2.2. 12/2016 T-score -2.6  . DILATION AND CURETTAGE OF UTERUS    . diverticulosis  05/2016   Noted on colonoscopy  . RECTOCELE REPAIR     purse string  . THYROIDECTOMY     Left lobectomy  . TOTAL KNEE ARTHROPLASTY  07/26/2012   Procedure: TOTAL KNEE ARTHROPLASTY;  Surgeon: Yvette Rack., MD;  Location: [place];  Service: Orthopedics;  Laterality: Right;  . TRANSTHORACIC ECHOCARDIOGRAM  05/24/2016   EF 60-65%, normal LV function.  No DD. No PFO or ASD (bubble study WAS done).    Outpatient Medications Prior to Visit  Medication Sig Dispense Refill  . amLODipine (NORVASC) 5 MG tablet Take 1 tablet (5 mg total) by mouth daily. 90 tablet 1  . aspirin EC 81 MG  tablet Take 1 tablet (81 mg total) by mouth daily. 30 tablet 0  . atorvastatin (LIPITOR) 20 MG tablet TAKE 1 TABLET BY MOUTH ONCE DAILY 90 tablet 1  . busPIRone (BUSPAR) 10 MG tablet Take 1 tablet (10 mg total) by mouth 2 (two) times daily. 180 tablet 0  . clonazePAM (KLONOPIN) 0.5 MG tablet 1-2 tabs po qhs prn insomnia 60 tablet 1  . levothyroxine (SYNTHROID, LEVOTHROID) 50 MCG tablet Take 1 tablet (50 mcg total) by mouth daily before breakfast. 30 tablet 6  . losartan (COZAAR) 100 MG tablet TAKE 1 TABLET BY MOUTH ONCE DAILY 90 tablet 1  . Multiple Vitamin (MV-ONE) CAPS Take 1 capsule by mouth daily.    . Omega-3 Fatty Acids (FISH OIL) 500 MG CAPS Take 1 capsule by mouth daily.    Marland Kitchen venlafaxine XR (EFFEXOR-XR) 75 MG 24 hr capsule TAKE 1 CAPSULE BY MOUTH DAILY WITH BREAKFAST 90 capsule 1  . azithromycin (ZITHROMAX) 250 MG tablet 2 tabs po qd x 1d, then 1 tab po qd x 4d (Patient not taking: Reported on 01/30/2018) 6 tablet 0  . benzonatate (TESSALON) 100 MG capsule Take 1 capsule (100 mg total) by mouth every 8 (eight) hours. (Patient not taking: Reported on 01/30/2018) 21 capsule 1  . cetirizine (ZYRTEC) 10 MG tablet Take 1 tablet (10 mg total) by mouth daily. (Patient not taking: Reported on 01/30/2018) 30 tablet 0  . fluticasone (FLONASE) 50 MCG/ACT nasal spray Place 1 spray into both nostrils daily. (Patient not taking: Reported on 01/30/2018) 16 g 2  . predniSONE (DELTASONE) 20 MG tablet 2 tabs po qd x 5d, then 1 tab po qd x 5d, then 1/2 tab po qd x 6d (Patient not taking: Reported on 01/30/2018) 18 tablet 0   No facility-administered medications prior to visit.     Allergies  Allergen Reactions  . Lamictal [Lamotrigine] Other (See Comments)  . Lithium Other (See Comments)    toxicity  . Phenylephrine Rash    Patient had allergic reaction to dilation combo drop. Please dilate with Tropicamide 0.5% only with Fluress and/or Proparacaine   . Sulfa Drugs Cross Reactors Rash    ROS As per  HPI  PE: Blood pressure 106/68, pulse 92, temperature 98.3 F (36.8 C), temperature source Oral, resp. rate 16, height 5' 1.5" (1.562 m), weight 152 lb 4 oz (69.1 kg), SpO2 94 %. Gen: Alert, well appearing.  Patient is oriented to person, place, time, and situation. AFFECT: pleasant, lucid thought and speech. Somewhat nervous, defers to husband quite a bit to keep her on track. R arm w/out any TTP.  Some pain with  general ROM, particularly forward flexion at shoulder and with reaching back behind her neck and also reaching behind low back.  ER/IR with resistance no pain. Speeds/yergason's neg.  Neg drop sign.  Some popping sound in R shoulder can be heard with extremes of ROM. No weakness.  LABS:    Chemistry      Component Value Date/Time   NA 143 10/22/2017 1352   K 4.6 10/22/2017 1352   CL 105 10/22/2017 1352   CO2 29 10/22/2017 1352   BUN 13 10/22/2017 1352   CREATININE 1.10 10/22/2017 1352   CREATININE 0.88 07/02/2015 0001      Component Value Date/Time   CALCIUM 12.1 (H) 10/22/2017 1352   ALKPHOS 76 10/22/2017 1352   AST 15 10/22/2017 1352   ALT 17 10/22/2017 1352   BILITOT 0.9 10/22/2017 1352     Lab Results  Component Value Date   CHOL 171 01/25/2017   HDL 50.30 01/25/2017   LDLCALC 88 01/25/2017   TRIG 164.0 (H) 01/25/2017   CHOLHDL 3 01/25/2017   Lab Results  Component Value Date   TSH 2.08 10/22/2017    IMPRESSION AND PLAN:  1) Recurrent MDD, severe chronic anxiety w/hx of panic, PTSD. Treatment resistant.   Has declined Bag[facility] therapy. Hx of lithium toxicity when this was tried as an add on med. Lamictal -->intolerance. She repeatedly declines referral to psychiatrist. She will be seeing Dr. Gaynell Face today for first visit! She was agreeable to increase in effexor xr to 150mg  qd to I did this today.  2) Right deltoid strain: doubt RC strain/tendonitis. Discussed gentle ROM, no reaching up or back until resolved, no heavy lifting. Heat application 20  min bid.  Ibuprofen 600 mg bid x 7d with food.  An After Visit Summary was printed and given to the patient.  FOLLOW UP: Return in about 2 months (around 04/01/2018) for f/u anx/dep.  Signed:  Crissie Sickles, MD           01/30/2018

## 2018-02-01 ENCOUNTER — Ambulatory Visit
Admission: RE | Admit: 2018-02-01 | Discharge: 2018-02-01 | Disposition: A | Discharge status: Home / Self Care | Payer: Medicare Other | Source: Ambulatory Visit | Attending: Endocrinology | Admitting: Endocrinology

## 2018-02-01 DIAGNOSIS — E049 Nontoxic goiter, unspecified: Secondary | ICD-10-CM

## 2018-02-01 DIAGNOSIS — E042 Nontoxic multinodular goiter: Secondary | ICD-10-CM | POA: Diagnosis not present

## 2018-02-04 ENCOUNTER — Ambulatory Visit (INDEPENDENT_AMBULATORY_CARE_PROVIDER_SITE_OTHER): Payer: Medicare Other | Admitting: Clinical

## 2018-02-04 DIAGNOSIS — F332 Major depressive disorder, recurrent severe without psychotic features: Secondary | ICD-10-CM | POA: Diagnosis not present

## 2018-02-04 DIAGNOSIS — F4310 Post-traumatic stress disorder, unspecified: Secondary | ICD-10-CM | POA: Diagnosis not present

## 2018-02-20 DIAGNOSIS — E049 Nontoxic goiter, unspecified: Secondary | ICD-10-CM | POA: Diagnosis not present

## 2018-02-20 DIAGNOSIS — E039 Hypothyroidism, unspecified: Secondary | ICD-10-CM | POA: Diagnosis not present

## 2018-03-13 ENCOUNTER — Ambulatory Visit (INDEPENDENT_AMBULATORY_CARE_PROVIDER_SITE_OTHER): Payer: Medicare Other | Admitting: Clinical

## 2018-03-13 DIAGNOSIS — F332 Major depressive disorder, recurrent severe without psychotic features: Secondary | ICD-10-CM

## 2018-03-13 DIAGNOSIS — F4310 Post-traumatic stress disorder, unspecified: Secondary | ICD-10-CM | POA: Diagnosis not present

## 2018-03-21 ENCOUNTER — Ambulatory Visit (INDEPENDENT_AMBULATORY_CARE_PROVIDER_SITE_OTHER): Payer: Medicare Other | Admitting: Family Medicine

## 2018-03-21 ENCOUNTER — Encounter: Payer: Self-pay | Admitting: Family Medicine

## 2018-03-21 VITALS — BP 144/85 | HR 86 | Temp 98.5°F | Resp 16 | Ht 61.5 in | Wt 155.4 lb

## 2018-03-21 DIAGNOSIS — M7581 Other shoulder lesions, right shoulder: Secondary | ICD-10-CM

## 2018-03-21 DIAGNOSIS — M7541 Impingement syndrome of right shoulder: Secondary | ICD-10-CM

## 2018-03-21 DIAGNOSIS — F411 Generalized anxiety disorder: Secondary | ICD-10-CM

## 2018-03-21 DIAGNOSIS — F4310 Post-traumatic stress disorder, unspecified: Secondary | ICD-10-CM | POA: Diagnosis not present

## 2018-03-21 DIAGNOSIS — F331 Major depressive disorder, recurrent, moderate: Secondary | ICD-10-CM | POA: Diagnosis not present

## 2018-03-21 DIAGNOSIS — F5104 Psychophysiologic insomnia: Secondary | ICD-10-CM

## 2018-03-21 MED ORDER — METHYLPREDNISOLONE ACETATE 80 MG/ML IJ SUSP
80.0000 mg | Freq: Once | INTRAMUSCULAR | Status: AC
  Administered 2018-03-21: 80 mg via INTRA_ARTICULAR

## 2018-03-21 NOTE — Progress Notes (Signed)
OFFICE VISIT  03/25/2018   CC:  Chief Complaint  Patient presents with  . Follow-up    Anxiety and Depression    HPI:    Patient is a 77 y.o. Caucasian female who presents for 4 mo f/u MDD, GAD, PTSD-->hx of being quite resistant to medical treatments, also declined [place]. Has repeatedly declined referral to psychiatrist but agreed 11/2017 to psychologist referral for counseling. I learned at last f/u that she has apparently been hesitant to proceed as recommended b/c her husband looks upon the idea of getting help as a sign of weakness. I did increase her venlafaxine to 150mg  qd last visit.  Interim HX:  Doing slightly better regarding anxiety and mood. Went to 4 visits with Dr. Gaynell Face and feels like this was somewhat helpful. Tolerated increase in venlafaxine.  Irritability and anger a bit less but not much. Crying is just a bit better.  No SI or HI. Takes clonaz to help sometimes for anxiety related insomnia but "not much". Still probs with dreams interrupting sleep.  Hurting about 2 mo now in R shoulder since lifting furniture. Feels like this is not as bad but still bothers her a significant amount: reaching up to do her hair esp. The pain does not involve her neck, nor does it radiate down her arm.  No paresthesias or weakness.     Past Medical History:  Diagnosis Date  . Anxiety and depression    with PTSD.  Dr. Johnsie Cancel ordered repeat MRI brain 2017 (no acute, no change from prior MR done in 2012) and referred pt to neuro for neuropsych testing but pt did not go.  Repeat MRI 10/2017 mild chronic microvasc isch change --stable compared to prior MRI.  Marland Kitchen Arthritis   . Bilateral carpal tunnel syndrome   . Cataracts, bilateral   . Chronic renal insufficiency, stage II (mild) 2015   CrCl about 60 ml/min  . Colitis    hx of  . DDD (degenerative disc disease), lumbar 2016   L1-2 and L5-S1 fairly severe--intramuscular injection of steroid and toradol by Dr. Antonietta Jewel 02/24/14 to  calm down the pain  . Enterocele 05/2016   Dx'd by Dr. Toney Rakes (GYN); he referred pt back to the MD at [facility] that did her prior bladder sling procedure.  No rectocele.  Marland Kitchen GERD (gastroesophageal reflux disease)   . Goiter    Lobectomy (left) of thyroid for benign nodule.  Right lobe nodules followed by Dr. Chalmers Cater (FNA 2012 benign goiter) with ultrasounds and have been stable, most recently 01/10/13.    . H/O adenomatous polyp of colon 2012; 05/2016   5 yr recall  . Hypercalcemia 10/2017   suspected to be due to lithium toxicity.  . Hyperlipidemia   . Hypertension   . Lithium toxicity 2019  . Lumbar spondylolysis 2016   L5: with grade I spondylolisthesis L5 on S1  . Osteoporosis 2012; 2018   2012-"penia".  2018 "porosis"--alendronate started 12/2016.  Repeat DEXA 12/2018.  Marland Kitchen PFO (patent foramen ovale) echo 03/21/05   "hole in heart" saw Dr. Sharee Holster [phone] [facility]. hospital.  Plavix med mgmt.  Dr. Johnsie Cancel repeated echo w/bubble study 05/2016 and it showed NO PFO or ASD.  Marland Kitchen Subclinical hypothyroidism    Dr. Chalmers Cater has her on 25 mcg synthroid qd as of 01/2014  . TIA (transient ischemic attack)    patient on plavix,  saw Dr. Isabell Jarvis at [facility].hospital in [place].  MRI brain 05/2016: mild chronic small vessel dz, o/w normal.  .  Urinary tract infection    hx of  . Vertigo     Past Surgical History:  Procedure Laterality Date  . ABDOMINAL HYSTERECTOMY    . BLADDER SUSPENSION    . BREAST EXCISIONAL BIOPSY Right 1960`   x2  . BREAST SURGERY     "breast lumps removed"  . COLONOSCOPY W/ POLYPECTOMY  04/2011; 05/29/16   +Diverticulosis and int hem.  Adenomatous polyp 2012.  No polyps on repeat TCS 05/2016--recall 5 yrs (05/2021)  . DEXA  05/23/2011   2012 T-score -2.2. 12/2016 T-score -2.6  . DILATION AND CURETTAGE OF UTERUS    . diverticulosis  05/2016   Noted on colonoscopy  . RECTOCELE REPAIR     purse string  . THYROIDECTOMY     Left lobectomy  . TOTAL KNEE  ARTHROPLASTY  07/26/2012   Procedure: TOTAL KNEE ARTHROPLASTY;  Surgeon: Yvette Rack., MD;  Location: [facility];  Service: Orthopedics;  Laterality: Right;  . TRANSTHORACIC ECHOCARDIOGRAM  05/24/2016   EF 60-65%, normal LV function.  No DD. No PFO or ASD (bubble study WAS done).    Outpatient Medications Prior to Visit  Medication Sig Dispense Refill  . amLODipine (NORVASC) 5 MG tablet Take 1 tablet (5 mg total) by mouth daily. 90 tablet 1  . aspirin EC 81 MG tablet Take 1 tablet (81 mg total) by mouth daily. 30 tablet 0  . atorvastatin (LIPITOR) 20 MG tablet TAKE 1 TABLET BY MOUTH ONCE DAILY 90 tablet 1  . busPIRone (BUSPAR) 10 MG tablet Take 1 tablet (10 mg total) by mouth 2 (two) times daily. 180 tablet 0  . clonazePAM (KLONOPIN) 0.5 MG tablet 1-2 tabs po qhs prn insomnia 60 tablet 1  . levothyroxine (SYNTHROID, LEVOTHROID) 50 MCG tablet Take 1 tablet (50 mcg total) by mouth daily before breakfast. 30 tablet 6  . losartan (COZAAR) 100 MG tablet TAKE 1 TABLET BY MOUTH ONCE DAILY 90 tablet 1  . Multiple Vitamin (MV-ONE) CAPS Take 1 capsule by mouth daily.    . Omega-3 Fatty Acids (FISH OIL) 500 MG CAPS Take 1 capsule by mouth daily.    Marland Kitchen venlafaxine XR (EFFEXOR-XR) 150 MG 24 hr capsule Take 1 capsule (150 mg total) by mouth daily with breakfast. 30 capsule 2   No facility-administered medications prior to visit.     Allergies  Allergen Reactions  . Lamictal [Lamotrigine] Other (See Comments)  . Lithium Other (See Comments)    toxicity  . Phenylephrine Rash    Patient had allergic reaction to dilation combo drop. Please dilate with Tropicamide 0.5% only with Fluress and/or Proparacaine   . Sulfa Drugs Cross Reactors Rash    ROS As per HPI  PE: Blood pressure (!) 144/85, pulse 86, temperature 98.5 F (36.9 C), temperature source Oral, resp. rate 16, height 5' 1.5" (1.562 m), weight 155 lb 6 oz (70.5 kg), SpO2 96 %. Gen: Alert, well appearing.  Patient is oriented to person, place,  time, and situation. AFFECT: pleasant, lucid thought and speech. Neck: nontender, full ROM. No tenderness of back/traps/shoulders/deltoids. Right shoulder with mild/mod pain with aBduction and ER, impaired ROM in aBduction. +Impingment signs. UE strength 5/5 prox/dist bilat. O'brien's mildly positive.  Neg drop sign.  LABS:    Chemistry      Component Value Date/Time   NA 143 10/22/2017 1352   K 4.6 10/22/2017 1352   CL 105 10/22/2017 1352   CO2 29 10/22/2017 1352   BUN 13 10/22/2017 1352   CREATININE  1.10 10/22/2017 1352   CREATININE 0.88 07/02/2015 0001      Component Value Date/Time   CALCIUM 12.1 (H) 10/22/2017 1352   ALKPHOS 76 10/22/2017 1352   AST 15 10/22/2017 1352   ALT 17 10/22/2017 1352   BILITOT 0.9 10/22/2017 1352     Lab Results  Component Value Date   TSH 2.08 10/22/2017   Lab Results  Component Value Date   CHOL 171 01/25/2017   HDL 50.30 01/25/2017   LDLCALC 88 01/25/2017   TRIG 164.0 (H) 01/25/2017   CHOLHDL 3 01/25/2017   IMPRESSION AND PLAN:  1) Chronic MDD, PTSD, GAD, insomnia: she is struggling about like her baseline. She did not indicate that she will stay in ongoing counseling. She was not for a change of any meds today.  2) Right RC tendonitis with impingement syndrome: she has done conservative treatment and elected to get steroid injection in R shoulder today.  Procedure: Therapeutic shoulder injection.  The patient's clinical condition is marked by substantial pain and/or significant functional disability.  Other conservative therapy has not provided relief, is contraindicated, or not appropriate.  There is a reasonable likelihood that injection will significantly improve the patient's pain and/or functional disability. Cleaned skin with alcohol swab, used posterolateral approach, Injected 1  ml of 80 mg/ml depo medrol + 2 ml of 1% plain lidocain into subacromial space without resistance.  No immediate complications.  Patient tolerated  procedure well.  Post-injection care discussed, including 20 min of icing 1-2 times in the next 4-8 hours, frequent non weight-bearing ROM exercises over the next few days, and general pain medication management.  An After Visit Summary was printed and given to the patient.  FOLLOW UP: Return in about 3 months (around 06/21/2018) for routine chronic illness f/u (30 min).  Signed:  Crissie Sickles, MD           03/25/2018

## 2018-03-21 NOTE — Patient Instructions (Signed)
Apply A&D ointment to rash under breasts and on lower abdomen---1-2 times per day. Otherwise, try to keep these skin areas dry and let air get to them as much as you can.

## 2018-03-28 ENCOUNTER — Ambulatory Visit: Payer: Medicare Other | Admitting: Family Medicine

## 2018-03-28 DIAGNOSIS — D1801 Hemangioma of skin and subcutaneous tissue: Secondary | ICD-10-CM | POA: Diagnosis not present

## 2018-03-28 DIAGNOSIS — L918 Other hypertrophic disorders of the skin: Secondary | ICD-10-CM | POA: Diagnosis not present

## 2018-03-28 DIAGNOSIS — L821 Other seborrheic keratosis: Secondary | ICD-10-CM | POA: Diagnosis not present

## 2018-03-28 DIAGNOSIS — D225 Melanocytic nevi of trunk: Secondary | ICD-10-CM | POA: Diagnosis not present

## 2018-03-28 DIAGNOSIS — L570 Actinic keratosis: Secondary | ICD-10-CM | POA: Diagnosis not present

## 2018-03-28 DIAGNOSIS — L814 Other melanin hyperpigmentation: Secondary | ICD-10-CM | POA: Diagnosis not present

## 2018-04-01 ENCOUNTER — Other Ambulatory Visit: Payer: Self-pay | Admitting: Family Medicine

## 2018-04-01 DIAGNOSIS — Z1231 Encounter for screening mammogram for malignant neoplasm of breast: Secondary | ICD-10-CM

## 2018-04-11 ENCOUNTER — Other Ambulatory Visit: Payer: Self-pay

## 2018-04-11 MED ORDER — VENLAFAXINE HCL ER 150 MG PO CP24: 150.0000 mg | ORAL_CAPSULE | Freq: Every day | ORAL | 0 refills | Status: DC

## 2018-04-22 ENCOUNTER — Other Ambulatory Visit: Payer: Self-pay | Admitting: Family Medicine

## 2018-04-24 ENCOUNTER — Ambulatory Visit
Admission: RE | Admit: 2018-04-24 | Discharge: 2018-04-24 | Disposition: A | Discharge status: Home / Self Care | Payer: Medicare Other | Source: Ambulatory Visit | Attending: Family Medicine | Admitting: Family Medicine

## 2018-04-24 DIAGNOSIS — Z1231 Encounter for screening mammogram for malignant neoplasm of breast: Secondary | ICD-10-CM

## 2018-05-31 ENCOUNTER — Ambulatory Visit (INDEPENDENT_AMBULATORY_CARE_PROVIDER_SITE_OTHER): Payer: Medicare Other

## 2018-05-31 DIAGNOSIS — Z23 Encounter for immunization: Secondary | ICD-10-CM

## 2018-06-20 ENCOUNTER — Encounter: Payer: Self-pay | Admitting: Family Medicine

## 2018-06-20 ENCOUNTER — Ambulatory Visit (INDEPENDENT_AMBULATORY_CARE_PROVIDER_SITE_OTHER): Payer: Medicare Other | Admitting: Family Medicine

## 2018-06-20 VITALS — BP 127/75 | HR 78 | Temp 98.6°F | Resp 16 | Ht 61.5 in | Wt 160.4 lb

## 2018-06-20 DIAGNOSIS — F329 Major depressive disorder, single episode, unspecified: Secondary | ICD-10-CM | POA: Diagnosis not present

## 2018-06-20 DIAGNOSIS — F32A Depression, unspecified: Secondary | ICD-10-CM

## 2018-06-20 DIAGNOSIS — F4310 Post-traumatic stress disorder, unspecified: Secondary | ICD-10-CM | POA: Diagnosis not present

## 2018-06-20 DIAGNOSIS — I10 Essential (primary) hypertension: Secondary | ICD-10-CM | POA: Diagnosis not present

## 2018-06-20 DIAGNOSIS — F411 Generalized anxiety disorder: Secondary | ICD-10-CM | POA: Diagnosis not present

## 2018-06-20 LAB — BASIC METABOLIC PANEL
BUN: 21 mg/dL (ref 6–23)
CALCIUM: 10.4 mg/dL (ref 8.4–10.5)
CHLORIDE: 106 meq/L (ref 96–112)
CO2: 30 mEq/L (ref 19–32)
Creatinine, Ser: 0.92 mg/dL (ref 0.40–1.20)
GFR: 63.27 mL/min (ref >60.00)
Glucose, Bld: 83 mg/dL (ref 70–99)
POTASSIUM: 4.7 meq/L (ref 3.5–5.1)
SODIUM: 143 meq/L (ref 135–145)

## 2018-06-20 MED ORDER — BUSPIRONE HCL 10 MG PO TABS: ORAL_TABLET | ORAL | 1 refills | Status: DC

## 2018-06-20 MED ORDER — CLONAZEPAM 0.5 MG PO TABS: ORAL_TABLET | ORAL | 1 refills | Status: DC

## 2018-06-20 MED ORDER — LOSARTAN POTASSIUM 100 MG PO TABS
100.0000 mg | ORAL_TABLET | Freq: Every day | ORAL | 1 refills | Status: DC
Start: 2018-06-20 — End: 2019-02-10

## 2018-06-20 NOTE — Progress Notes (Signed)
OFFICE VISIT  06/20/2018   CC:  Chief Complaint  Patient presents with  . Follow-up    RCI, pt is fasting.      HPI:    Patient is a 77 y.o. Caucasian female who presents accompanied by her husband for 3 mo f/u chronic MDD, GAD with PTSD--largely resistant to any medical treatments.  I also follow her for HTN, has CRI with GFR in 60 range.  Interim hx: She is about the same. Counseling with Dr. Gaynell Face? Helpful? Pt not sure, as usual.  Insurer limits # of visits some. Pt states she knows what has been going on and what she needs to do, and talking about it with a counselor kind of seems pointless to her. She is trying to go out and be around people more.  Crying spells not quite as bad. She has been taking the buspirone only prn anxiety, needs to get back on this scheduled. Taking clonaz most nights, needs new rx.  HTN: home bp's consistently <130/80.  Compliant with med.  Past Medical History:  Diagnosis Date  . Anxiety and depression    with PTSD.  Dr. Johnsie Cancel ordered repeat MRI brain 2017 (no acute, no change from prior MR done in 2012) and referred pt to neuro for neuropsych testing but pt did not go.  Repeat MRI 10/2017 mild chronic microvasc isch change --stable compared to prior MRI.  Marland Kitchen Arthritis   . Bilateral carpal tunnel syndrome   . Cataracts, bilateral   . Chronic renal insufficiency, stage II (mild) 2015   CrCl about 60 ml/min  . Colitis    hx of  . DDD (degenerative disc disease), lumbar 2016   L1-2 and L5-S1 fairly severe--intramuscular injection of steroid and toradol by Dr. Antonietta Jewel 02/24/14 to calm down the pain  . Enterocele 05/2016   Dx'd by Dr. Toney Rakes (GYN); he referred pt back to the MD at [facility] that did her prior bladder sling procedure.  No rectocele.  Marland Kitchen GERD (gastroesophageal reflux disease)   . Goiter    Lobectomy (left) of thyroid for benign nodule.  Right lobe nodules followed by Dr. Chalmers Cater (FNA 2012 benign goiter) with ultrasounds and have  been stable, most recently 01/10/13.    . H/O adenomatous polyp of colon 2012; 05/2016   5 yr recall  . Hypercalcemia 10/2017   suspected to be due to lithium toxicity.  . Hyperlipidemia   . Hypertension   . Lithium toxicity 2019  . Lumbar spondylolysis 2016   L5: with grade I spondylolisthesis L5 on S1  . Osteoporosis 2012; 2018   2012-"penia".  2018 "porosis"--alendronate started 12/2016.  Repeat DEXA 12/2018.  Marland Kitchen PFO (patent foramen ovale) echo 03/21/05   "hole in heart" saw Dr. Sharee Holster [phone] [facility]. hospital.  Plavix med mgmt.  Dr. Johnsie Cancel repeated echo w/bubble study 05/2016 and it showed NO PFO or ASD.  Marland Kitchen Subclinical hypothyroidism    Dr. Chalmers Cater has her on 25 mcg synthroid qd as of 01/2014  . TIA (transient ischemic attack)    patient on plavix,  saw Dr. Isabell Jarvis at [facility].hospital in [place].  MRI brain 05/2016: mild chronic small vessel dz, o/w normal.  . Urinary tract infection    hx of  . Vertigo     Past Surgical History:  Procedure Laterality Date  . ABDOMINAL HYSTERECTOMY    . BLADDER SUSPENSION    . BREAST EXCISIONAL BIOPSY Right 1960`  . BREAST EXCISIONAL BIOPSY Left   . BREAST SURGERY     "  breast lumps removed"  . COLONOSCOPY W/ POLYPECTOMY  04/2011; 05/29/16   +Diverticulosis and int hem.  Adenomatous polyp 2012.  No polyps on repeat TCS 05/2016--recall 5 yrs (05/2021)  . DEXA  05/23/2011   2012 T-score -2.2. 12/2016 T-score -2.6  . DILATION AND CURETTAGE OF UTERUS    . diverticulosis  05/2016   Noted on colonoscopy  . RECTOCELE REPAIR     purse string  . THYROIDECTOMY     Left lobectomy  . TOTAL KNEE ARTHROPLASTY  07/26/2012   Procedure: TOTAL KNEE ARTHROPLASTY;  Surgeon: Yvette Rack., MD;  Location: [facility];  Service: Orthopedics;  Laterality: Right;  . TRANSTHORACIC ECHOCARDIOGRAM  05/24/2016   EF 60-65%, normal LV function.  No DD. No PFO or ASD (bubble study WAS done).    Outpatient Medications Prior to Visit  Medication Sig Dispense  Refill  . amLODipine (NORVASC) 5 MG tablet TAKE 1 TABLET BY MOUTH ONCE DAILY 90 tablet 1  . aspirin EC 81 MG tablet Take 1 tablet (81 mg total) by mouth daily. 30 tablet 0  . atorvastatin (LIPITOR) 20 MG tablet TAKE 1 TABLET BY MOUTH ONCE DAILY 90 tablet 1  . levothyroxine (SYNTHROID, LEVOTHROID) 50 MCG tablet Take 1 tablet (50 mcg total) by mouth daily before breakfast. 30 tablet 6  . Multiple Vitamin (MV-ONE) CAPS Take 1 capsule by mouth daily.    . Omega-3 Fatty Acids (FISH OIL) 500 MG CAPS Take 1 capsule by mouth daily.    Marland Kitchen venlafaxine XR (EFFEXOR-XR) 150 MG 24 hr capsule Take 1 capsule (150 mg total) by mouth daily with breakfast. 90 capsule 0  . busPIRone (BUSPAR) 10 MG tablet Take 1 tablet (10 mg total) by mouth 2 (two) times daily. 180 tablet 0  . clonazePAM (KLONOPIN) 0.5 MG tablet 1-2 tabs po qhs prn insomnia 60 tablet 1  . losartan (COZAAR) 100 MG tablet TAKE 1 TABLET BY MOUTH ONCE DAILY 90 tablet 1   No facility-administered medications prior to visit.     Allergies  Allergen Reactions  . Lamictal [Lamotrigine] Other (See Comments)  . Lithium Other (See Comments)    toxicity  . Phenylephrine Rash    Patient had allergic reaction to dilation combo drop. Please dilate with Tropicamide 0.5% only with Fluress and/or Proparacaine   . Sulfa Drugs Cross Reactors Rash    ROS As per HPI  PE: Blood pressure 127/75, pulse 78, temperature 98.6 F (37 C), temperature source Oral, resp. rate 16, height 5' 1.5" (1.562 m), weight 160 lb 6 oz (72.7 kg), SpO2 95 %. Gen: Alert, well appearing.  Patient is oriented to person, place, time, and situation. AFFECT: pleasant but anxious, lucid thought and speech although she is easily flustered. No further exam today.  LABS:    Chemistry      Component Value Date/Time   NA 143 06/20/2018 1022   K 4.7 06/20/2018 1022   CL 106 06/20/2018 1022   CO2 30 06/20/2018 1022   BUN 21 06/20/2018 1022   CREATININE 0.92 06/20/2018 1022    CREATININE 0.88 07/02/2015 0001      Component Value Date/Time   CALCIUM 10.4 06/20/2018 1022   ALKPHOS 76 10/22/2017 1352   AST 15 10/22/2017 1352   ALT 17 10/22/2017 1352   BILITOT 0.9 10/22/2017 1352     Lab Results  Component Value Date   TSH 2.08 10/22/2017   Lab Results  Component Value Date   WBC 11.0 (H) 10/22/2017   HGB  14.3 10/22/2017   HCT 43.1 10/22/2017   MCV 89.9 10/22/2017   PLT 245.0 10/22/2017   Lab Results  Component Value Date   CHOL 171 01/25/2017   HDL 50.30 01/25/2017   LDLCALC 88 01/25/2017   TRIG 164.0 (H) 01/25/2017   CHOLHDL 3 01/25/2017    IMPRESSION AND PLAN:  1) Chronic depression, in partial remission. GAD. PTSD.  Pt has put up a psychological block when it comes to receiving counseling. The only change in med today is to get her to take her buspar on a tid scheduled basis (1/2-1 10mg  tab). Rx's for clonazepam, buspar, and losartan for 90d supply with 1 RF done today.   2) HTN; The current medical regimen is effective;  continue present plan and medications. BMET today.  Spent 25 min with pt today, with >50% of this time spent in counseling and care coordination regarding the above problems.  An After Visit Summary was printed and given to the patient.  FOLLOW UP: Return in about 3 months (around 09/20/2018).  Signed:  Crissie Sickles, MD           06/20/2018

## 2018-07-18 ENCOUNTER — Other Ambulatory Visit: Payer: Self-pay | Admitting: *Deleted

## 2018-07-18 MED ORDER — ATORVASTATIN CALCIUM 20 MG PO TABS: 20.0000 mg | ORAL_TABLET | Freq: Every day | ORAL | 1 refills | Status: DC

## 2018-07-19 ENCOUNTER — Other Ambulatory Visit: Payer: Self-pay | Admitting: *Deleted

## 2018-07-19 MED ORDER — VENLAFAXINE HCL ER 150 MG PO CP24: 150.0000 mg | ORAL_CAPSULE | Freq: Every day | ORAL | 1 refills | Status: DC

## 2018-08-01 DIAGNOSIS — H2513 Age-related nuclear cataract, bilateral: Secondary | ICD-10-CM | POA: Diagnosis not present

## 2018-08-01 DIAGNOSIS — H43393 Other vitreous opacities, bilateral: Secondary | ICD-10-CM | POA: Diagnosis not present

## 2018-08-01 DIAGNOSIS — D23122 Other benign neoplasm of skin of left lower eyelid, including canthus: Secondary | ICD-10-CM | POA: Diagnosis not present

## 2018-08-01 DIAGNOSIS — H5203 Hypermetropia, bilateral: Secondary | ICD-10-CM | POA: Diagnosis not present

## 2018-08-01 DIAGNOSIS — D23112 Other benign neoplasm of skin of right lower eyelid, including canthus: Secondary | ICD-10-CM | POA: Diagnosis not present

## 2018-08-01 DIAGNOSIS — H52203 Unspecified astigmatism, bilateral: Secondary | ICD-10-CM | POA: Diagnosis not present

## 2018-08-01 DIAGNOSIS — H16223 Keratoconjunctivitis sicca, not specified as Sjogren's, bilateral: Secondary | ICD-10-CM | POA: Diagnosis not present

## 2018-08-01 DIAGNOSIS — H04123 Dry eye syndrome of bilateral lacrimal glands: Secondary | ICD-10-CM | POA: Diagnosis not present

## 2018-08-01 DIAGNOSIS — H524 Presbyopia: Secondary | ICD-10-CM | POA: Diagnosis not present

## 2018-10-14 ENCOUNTER — Telehealth: Payer: Self-pay | Admitting: Family Medicine

## 2018-10-15 NOTE — Telephone Encounter (Signed)
My chart message read.

## 2019-02-06 ENCOUNTER — Other Ambulatory Visit: Payer: Self-pay | Admitting: Family Medicine

## 2019-02-10 ENCOUNTER — Telehealth: Payer: Self-pay | Admitting: Family Medicine

## 2019-02-10 NOTE — Telephone Encounter (Signed)
Okay per Dr Anitra Lauth for telephone visit.  Appt scheduled 02/25/2019 @ 3:30pm.

## 2019-02-10 NOTE — Telephone Encounter (Signed)
Sent in atorvastatin, effexor, and losartan # 30 for patient. She is over due for OV.   Okay to schedule telephone visit?   She doesn't know how to do VV and is afraid to come into office at this time.  Please advise.

## 2019-02-10 NOTE — Telephone Encounter (Signed)
Noted. Agree.

## 2019-02-18 DIAGNOSIS — E039 Hypothyroidism, unspecified: Secondary | ICD-10-CM | POA: Diagnosis not present

## 2019-02-24 ENCOUNTER — Other Ambulatory Visit: Payer: Self-pay

## 2019-02-24 ENCOUNTER — Ambulatory Visit (INDEPENDENT_AMBULATORY_CARE_PROVIDER_SITE_OTHER): Payer: Medicare Other | Admitting: Family Medicine

## 2019-02-24 ENCOUNTER — Encounter: Payer: Self-pay | Admitting: Family Medicine

## 2019-02-24 VITALS — BP 141/75 | HR 74 | Temp 98.7°F | Wt 154.0 lb

## 2019-02-24 DIAGNOSIS — F411 Generalized anxiety disorder: Secondary | ICD-10-CM

## 2019-02-24 DIAGNOSIS — F3341 Major depressive disorder, recurrent, in partial remission: Secondary | ICD-10-CM | POA: Diagnosis not present

## 2019-02-24 DIAGNOSIS — I10 Essential (primary) hypertension: Secondary | ICD-10-CM

## 2019-02-24 DIAGNOSIS — F4310 Post-traumatic stress disorder, unspecified: Secondary | ICD-10-CM

## 2019-02-24 DIAGNOSIS — E7800 Pure hypercholesterolemia, unspecified: Secondary | ICD-10-CM | POA: Diagnosis not present

## 2019-02-24 MED ORDER — VENLAFAXINE HCL ER 150 MG PO CP24: ORAL_CAPSULE | ORAL | 3 refills | Status: DC

## 2019-02-24 MED ORDER — LOSARTAN POTASSIUM 100 MG PO TABS: 100.0000 mg | ORAL_TABLET | Freq: Every day | ORAL | 3 refills | Status: DC

## 2019-02-24 MED ORDER — ATORVASTATIN CALCIUM 20 MG PO TABS: 20.0000 mg | ORAL_TABLET | Freq: Every day | ORAL | 3 refills | Status: DC

## 2019-02-24 MED ORDER — AMLODIPINE BESYLATE 10 MG PO TABS: 10.0000 mg | ORAL_TABLET | Freq: Every day | ORAL | 3 refills | Status: DC

## 2019-02-24 NOTE — Progress Notes (Signed)
Virtual Visit via Video Note  I connected with pt on 02/24/19 at  3:30 PM EDT by a telephone (b/c video enabled telemedicine application was not an option) and verified that I am speaking with the correct person using two identifiers.  Location patient: home Location provider:work or home office Persons participating in the virtual visit: patient, provider  I discussed the limitations of evaluation and management by telephone and the availability of in person appointments. The patient expressed understanding and agreed to proceed.  Telephone visit is a necessity given the COVID-19 restrictions in place at the current time.  HPI: 78 y/o WF with whom I am doing a telephone visit today (due to COVID-19 pandemic restrictions) for f/u chronic MDD, GAD with PTSD--largely resistant to any medical treatments.  I also follow her for HTN, has CRI with GFR in 60 range. I last saw her 06/20/18, at which time I encouraged her to get back on buspar on scheduled rather than prn basis. BMET that day normal (GFR 63 ml/min). She is also on atorvastatin 20mg  qd for hypercholesterolemia.  She remains the same meds regularly. Sleep the last 4 wks "better", w/out needing clonaz much. Doesn't take clonaz in the daytime, says she didn't know it was for anxiety in daytime!!!  Overall she has felt better overall lately.  Still quite anxious, though. Gets depressed mood often, says it mostly just makes her very angry and irritable. She says she sometimes relives the past situation that caused her PTSD sx's but not very often. No SI or HI.   BP: only occ bp check at home: 140-150 syst per her recollection.  Diast 80s.   Hyperchol: she takes her cholesterol medication daily, no side effects.   ROS: no CP, no SOB, no wheezing, no cough, no dizziness, no HAs, no rashes, no melena/hematochezia.  No polyuria or polydipsia.  No myalgias or arthralgias.   Past Medical History:  Diagnosis Date  . Anxiety and  depression    with PTSD.  Dr. Johnsie Cancel ordered repeat MRI brain 2017 (no acute, no change from prior MR done in 2012) and referred pt to neuro for neuropsych testing but pt did not go.  Repeat MRI 10/2017 mild chronic microvasc isch change --stable compared to prior MRI.  Marland Kitchen Arthritis   . Bilateral carpal tunnel syndrome   . Cataracts, bilateral   . Chronic renal insufficiency, stage II (mild) 2015   CrCl about 60 ml/min  . Colitis    hx of  . DDD (degenerative disc disease), lumbar 2016   L1-2 and L5-S1 fairly severe--intramuscular injection of steroid and toradol by Dr. Antonietta Jewel 02/24/14 to calm down the pain  . Enterocele 05/2016   Dx'd by Dr. Toney Rakes (GYN); he referred pt back to the MD at [facility] that did her prior bladder sling procedure.  No rectocele.  Marland Kitchen GERD (gastroesophageal reflux disease)   . Goiter    Lobectomy (left) of thyroid for benign nodule.  Right lobe nodules followed by Dr. Chalmers Cater (FNA 2012 benign goiter) with ultrasounds and have been stable, most recently 01/10/13.    . H/O adenomatous polyp of colon 2012; 05/2016   5 yr recall  . Hypercalcemia 10/2017   suspected to be due to lithium toxicity.  . Hyperlipidemia   . Hypertension   . Lithium toxicity 2019  . Lumbar spondylolysis 2016   L5: with grade I spondylolisthesis L5 on S1  . Osteoporosis 2012; 2018   2012-"penia".  2018 "porosis"--alendronate started 12/2016.  Repeat DEXA 12/2018.  Marland Kitchen  PFO (patent foramen ovale) echo 03/21/05   "hole in heart" saw Dr. Sharee Holster [phone] [facility]. hospital.  Plavix med mgmt.  Dr. Johnsie Cancel repeated echo w/bubble study 05/2016 and it showed NO PFO or ASD.  Marland Kitchen Subclinical hypothyroidism    Dr. Chalmers Cater has her on 25 mcg synthroid qd as of 01/2014  . TIA (transient ischemic attack)    patient on plavix,  saw Dr. Isabell Jarvis at [facility].hospital in [place].  MRI brain 05/2016: mild chronic small vessel dz, o/w normal.  . Urinary tract infection    hx of  . Vertigo     Past Surgical  History:  Procedure Laterality Date  . ABDOMINAL HYSTERECTOMY    . BLADDER SUSPENSION    . BREAST EXCISIONAL BIOPSY Right 1960`  . BREAST EXCISIONAL BIOPSY Left   . BREAST SURGERY     "breast lumps removed"  . COLONOSCOPY W/ POLYPECTOMY  04/2011; 05/29/16   +Diverticulosis and int hem.  Adenomatous polyp 2012.  No polyps on repeat TCS 05/2016--recall 5 yrs (05/2021)  . DEXA  05/23/2011   2012 T-score -2.2. 12/2016 T-score -2.6  . DILATION AND CURETTAGE OF UTERUS    . diverticulosis  05/2016   Noted on colonoscopy  . RECTOCELE REPAIR     purse string  . THYROIDECTOMY     Left lobectomy  . TOTAL KNEE ARTHROPLASTY  07/26/2012   Procedure: TOTAL KNEE ARTHROPLASTY;  Surgeon: Yvette Rack., MD;  Location: [facility];  Service: Orthopedics;  Laterality: Right;  . TRANSTHORACIC ECHOCARDIOGRAM  05/24/2016   EF 60-65%, normal LV function.  No DD. No PFO or ASD (bubble study WAS done).    Family History  Problem Relation Age of Onset  . Heart disease Mother   . Stomach cancer Father   . Cancer Father   . Colon polyps Daughter   . Diabetes Cousin   . Colon cancer Neg Hx      Current Outpatient Medications:  .  amLODipine (NORVASC) 5 MG tablet, Take 1 tablet by mouth once daily, Disp: 90 tablet, Rfl: 1 .  aspirin EC 81 MG tablet, Take 1 tablet (81 mg total) by mouth daily., Disp: 30 tablet, Rfl: 0 .  atorvastatin (LIPITOR) 20 MG tablet, Take 1 tablet by mouth once daily, Disp: 30 tablet, Rfl: 0 .  busPIRone (BUSPAR) 10 MG tablet, 1/2-1 tab po tid, Disp: 270 tablet, Rfl: 1 .  clonazePAM (KLONOPIN) 0.5 MG tablet, 1-2 tabs po qhs prn insomnia, Disp: 180 tablet, Rfl: 1 .  levothyroxine (SYNTHROID, LEVOTHROID) 50 MCG tablet, Take 1 tablet (50 mcg total) by mouth daily before breakfast., Disp: 30 tablet, Rfl: 6 .  losartan (COZAAR) 100 MG tablet, Take 1 tablet by mouth once daily, Disp: 30 tablet, Rfl: 0 .  Multiple Vitamin (MV-ONE) CAPS, Take 1 capsule by mouth daily., Disp: , Rfl:  .  Omega-3  Fatty Acids (FISH OIL) 500 MG CAPS, Take 1 capsule by mouth daily., Disp: , Rfl:  .  venlafaxine XR (EFFEXOR-XR) 150 MG 24 hr capsule, TAKE 1 CAPSULE BY MOUTH ONCE DAILY WITH BREAKFAST, Disp: 30 capsule, Rfl: 0  EXAM:  VITALS per patient if applicable: BP (!) 132/44 (BP Location: Left Arm, Patient Position: Sitting, Cuff Size: Normal)   Pulse 74   Temp 98.7 F (37.1 C) (Oral)   Wt 154 lb (69.9 kg)   SpO2 97%   BMI 28.63 kg/m    GENERAL: alert, oriented, in no acute distress  No further exam b/c this  was telephone-only visit.  Labs: none today    Chemistry      Component Value Date/Time   NA 143 06/20/2018 1022   K 4.7 06/20/2018 1022   CL 106 06/20/2018 1022   CO2 30 06/20/2018 1022   BUN 21 06/20/2018 1022   CREATININE 0.92 06/20/2018 1022   CREATININE 0.88 07/02/2015 0001      Component Value Date/Time   CALCIUM 10.4 06/20/2018 1022   ALKPHOS 76 10/22/2017 1352   AST 15 10/22/2017 1352   ALT 17 10/22/2017 1352   BILITOT 0.9 10/22/2017 1352     Lab Results  Component Value Date   WBC 11.0 (H) 10/22/2017   HGB 14.3 10/22/2017   HCT 43.1 10/22/2017   MCV 89.9 10/22/2017   PLT 245.0 10/22/2017   Lab Results  Component Value Date   CHOL 171 01/25/2017   HDL 50.30 01/25/2017   LDLCALC 88 01/25/2017   TRIG 164.0 (H) 01/25/2017   CHOLHDL 3 01/25/2017   Lab Results  Component Value Date   TSH 2.08 10/22/2017    ASSESSMENT AND PLAN:  Discussed the following assessment and plan:  1)  GAD, with insomnia. PTSD. Recurrent MDD, in partial remission. She is stable.  Unfortunately, stable for her is barely functional.  She continues to have poor insight into her problems and is habitually noncompliant with meds.  I encouraged her to use her clonaz in the daytime for anxiety, not just hs for insomnia.  I have repeatedly told her this. I also have repeatedly reminded her that buspar is not a prn medication---take it scheduled 1 tab tid. She cannot be talked into  any further counseling.  2) HTN: not ideal control. Increase amlodipine to 10mg  qd, otherwise no changes. BMET--future.  3) HLD: tolerating statin. FLP and hepatic panel --future.   4) CRI with GFR around 60 ml/min-->avoids NSAIDs.  Tries to hydrate well. BMET--future.  I discussed the assessment and treatment plan with the patient. The patient was provided an opportunity to ask questions and all were answered. The patient agreed with the plan and demonstrated an understanding of the instructions.   The patient was advised to call back or seek an in-person evaluation if the symptoms worsen or if the condition fails to improve as anticipated.  F/u: 6 mo in office [facility]  Spent 15 min with pt today, with >50% of this time spent in counseling and care coordination regarding the above problems.  Signed:  Crissie Sickles, MD           02/24/2019

## 2019-02-25 DIAGNOSIS — E039 Hypothyroidism, unspecified: Secondary | ICD-10-CM | POA: Diagnosis not present

## 2019-02-25 DIAGNOSIS — I10 Essential (primary) hypertension: Secondary | ICD-10-CM | POA: Diagnosis not present

## 2019-02-25 DIAGNOSIS — E049 Nontoxic goiter, unspecified: Secondary | ICD-10-CM | POA: Diagnosis not present

## 2019-03-07 ENCOUNTER — Other Ambulatory Visit: Payer: Self-pay

## 2019-03-07 ENCOUNTER — Ambulatory Visit (INDEPENDENT_AMBULATORY_CARE_PROVIDER_SITE_OTHER): Payer: Medicare Other | Admitting: Family Medicine

## 2019-03-07 DIAGNOSIS — E7800 Pure hypercholesterolemia, unspecified: Secondary | ICD-10-CM

## 2019-03-07 DIAGNOSIS — I10 Essential (primary) hypertension: Secondary | ICD-10-CM

## 2019-03-07 LAB — LIPID PANEL
Cholesterol: 164 mg/dL (ref 0–200)
HDL: 57.9 mg/dL (ref >39.00)
LDL Cholesterol: 84 mg/dL (ref 0–99)
NonHDL: 105.62
Total CHOL/HDL Ratio: 3
Triglycerides: 110 mg/dL (ref 0.0–149.0)
VLDL: 22 mg/dL (ref 0.0–40.0)

## 2019-03-07 LAB — COMPREHENSIVE METABOLIC PANEL
ALT: 17 U/L (ref 0–35)
AST: 18 U/L (ref 0–37)
Albumin: 4.6 g/dL (ref 3.5–5.2)
Alkaline Phosphatase: 104 U/L (ref 39–117)
BUN: 17 mg/dL (ref 6–23)
CO2: 27 mEq/L (ref 19–32)
Calcium: 10.3 mg/dL (ref 8.4–10.5)
Chloride: 107 mEq/L (ref 96–112)
Creatinine, Ser: 0.97 mg/dL (ref 0.40–1.20)
GFR: 55.89 mL/min — ABNORMAL LOW (ref >60.00)
Glucose, Bld: 76 mg/dL (ref 70–99)
Potassium: 4.6 mEq/L (ref 3.5–5.1)
Sodium: 143 mEq/L (ref 135–145)
Total Bilirubin: 0.8 mg/dL (ref 0.2–1.2)
Total Protein: 6.5 g/dL (ref 6.0–8.3)

## 2019-03-10 ENCOUNTER — Telehealth: Payer: Self-pay

## 2019-03-10 NOTE — Telephone Encounter (Signed)
MyChart message sent with results.  

## 2019-05-15 DIAGNOSIS — L603 Nail dystrophy: Secondary | ICD-10-CM | POA: Diagnosis not present

## 2019-05-15 DIAGNOSIS — L821 Other seborrheic keratosis: Secondary | ICD-10-CM | POA: Diagnosis not present

## 2019-05-15 DIAGNOSIS — D692 Other nonthrombocytopenic purpura: Secondary | ICD-10-CM | POA: Diagnosis not present

## 2019-05-15 DIAGNOSIS — L918 Other hypertrophic disorders of the skin: Secondary | ICD-10-CM | POA: Diagnosis not present

## 2019-05-15 DIAGNOSIS — D1801 Hemangioma of skin and subcutaneous tissue: Secondary | ICD-10-CM | POA: Diagnosis not present

## 2019-05-15 DIAGNOSIS — L304 Erythema intertrigo: Secondary | ICD-10-CM | POA: Diagnosis not present

## 2019-05-28 ENCOUNTER — Other Ambulatory Visit: Payer: Self-pay | Admitting: Family Medicine

## 2019-05-28 DIAGNOSIS — Z1231 Encounter for screening mammogram for malignant neoplasm of breast: Secondary | ICD-10-CM

## 2019-05-29 ENCOUNTER — Ambulatory Visit (INDEPENDENT_AMBULATORY_CARE_PROVIDER_SITE_OTHER): Payer: Medicare Other

## 2019-05-29 ENCOUNTER — Other Ambulatory Visit: Payer: Self-pay

## 2019-05-29 DIAGNOSIS — Z23 Encounter for immunization: Secondary | ICD-10-CM

## 2019-07-09 ENCOUNTER — Other Ambulatory Visit: Payer: Self-pay

## 2019-07-17 ENCOUNTER — Other Ambulatory Visit: Payer: Self-pay

## 2019-07-17 ENCOUNTER — Ambulatory Visit
Admission: RE | Admit: 2019-07-17 | Discharge: 2019-07-17 | Disposition: A | Discharge status: Home / Self Care | Payer: Medicare Other | Source: Ambulatory Visit | Attending: Family Medicine | Admitting: Family Medicine

## 2019-07-17 DIAGNOSIS — Z1231 Encounter for screening mammogram for malignant neoplasm of breast: Secondary | ICD-10-CM

## 2019-08-18 DIAGNOSIS — D23122 Other benign neoplasm of skin of left lower eyelid, including canthus: Secondary | ICD-10-CM | POA: Diagnosis not present

## 2019-08-18 DIAGNOSIS — H16223 Keratoconjunctivitis sicca, not specified as Sjogren's, bilateral: Secondary | ICD-10-CM | POA: Diagnosis not present

## 2019-08-18 DIAGNOSIS — H52203 Unspecified astigmatism, bilateral: Secondary | ICD-10-CM | POA: Diagnosis not present

## 2019-08-18 DIAGNOSIS — H524 Presbyopia: Secondary | ICD-10-CM | POA: Diagnosis not present

## 2019-08-18 DIAGNOSIS — D23112 Other benign neoplasm of skin of right lower eyelid, including canthus: Secondary | ICD-10-CM | POA: Diagnosis not present

## 2019-08-18 DIAGNOSIS — H43393 Other vitreous opacities, bilateral: Secondary | ICD-10-CM | POA: Diagnosis not present

## 2019-08-18 DIAGNOSIS — H2513 Age-related nuclear cataract, bilateral: Secondary | ICD-10-CM | POA: Diagnosis not present

## 2019-08-18 DIAGNOSIS — H5203 Hypermetropia, bilateral: Secondary | ICD-10-CM | POA: Diagnosis not present

## 2019-08-20 ENCOUNTER — Encounter: Payer: Self-pay | Admitting: Family Medicine

## 2019-08-20 ENCOUNTER — Other Ambulatory Visit: Payer: Self-pay

## 2019-08-20 ENCOUNTER — Ambulatory Visit (INDEPENDENT_AMBULATORY_CARE_PROVIDER_SITE_OTHER): Payer: Medicare Other | Admitting: Family Medicine

## 2019-08-20 VITALS — BP 129/81 | HR 68 | Temp 99.1°F | Resp 16 | Ht 61.5 in | Wt 159.8 lb

## 2019-08-20 DIAGNOSIS — N182 Chronic kidney disease, stage 2 (mild): Secondary | ICD-10-CM

## 2019-08-20 DIAGNOSIS — F3341 Major depressive disorder, recurrent, in partial remission: Secondary | ICD-10-CM | POA: Diagnosis not present

## 2019-08-20 DIAGNOSIS — I10 Essential (primary) hypertension: Secondary | ICD-10-CM

## 2019-08-20 DIAGNOSIS — F4310 Post-traumatic stress disorder, unspecified: Secondary | ICD-10-CM | POA: Diagnosis not present

## 2019-08-20 DIAGNOSIS — F411 Generalized anxiety disorder: Secondary | ICD-10-CM | POA: Diagnosis not present

## 2019-08-20 DIAGNOSIS — E7800 Pure hypercholesterolemia, unspecified: Secondary | ICD-10-CM

## 2019-08-20 LAB — BASIC METABOLIC PANEL
BUN: 15 mg/dL (ref 6–23)
CO2: 29 mEq/L (ref 19–32)
Calcium: 10.1 mg/dL (ref 8.4–10.5)
Chloride: 106 mEq/L (ref 96–112)
Creatinine, Ser: 0.85 mg/dL (ref 0.40–1.20)
GFR: 65.01 mL/min (ref >60.00)
Glucose, Bld: 85 mg/dL (ref 70–99)
Potassium: 5 mEq/L (ref 3.5–5.1)
Sodium: 141 mEq/L (ref 135–145)

## 2019-08-20 NOTE — Progress Notes (Signed)
OFFICE VISIT  08/20/2019   CC:  Chief Complaint  Patient presents with  . Follow-up    [facility], pt is fasting   HPI:    Patient is a 79 y.o. Caucasian female who presents unaccompanied for 6 mo f/u chronic MDD, GAD with PTSD--largely resistant to any medical treatments. I also follow her for HTN, has CRI with GFR in 60 range, follow her for hypercholesterolemia as well. She sees Dr. Chalmers Cater for hypothyroidism.  A/P as of last visit with me: " 1)GAD, with insomnia. PTSD. Recurrent MDD, in partial remission. She is stable.  Unfortunately, stable for her is barely functional.  She continues to have poor insight into her problems and is habitually noncompliant with meds.  I encouraged her to use her clonaz in the daytime for anxiety, not just hs for insomnia.  I have repeatedly told her this. I also have repeatedly reminded her that buspar is not a prn medication---take it scheduled 1 tab tid. She cannot be talked into any further counseling.  2) HTN: not ideal control. Increase amlodipine to 10mg  qd, otherwise no changes. BMET--future.  3) HLD: tolerating statin. FLP and hepatic panel --future.  4) CRI with GFR around 60 ml/min-->avoids NSAIDs.  Tries to hydrate well. BMET--future."  Interim hx: All labs after her last visit were great.  Home bp checks typically 140s-150s/80s-90.  Sometimes near 130/80. Says doing "fine".  Spent time going over med list. As usual, taking buspar only hs---not as I've instructed her. Still crying easily, has to force herself to do things and this makes her angry.  Worries constantly about EVERYTHING. Has bad dreams chronically. Says she struggles with being a chronic enabler. She continues to decline to see a psychiatrist or psychologist. No SI or HI.  Past Medical History:  Diagnosis Date  . Anxiety and depression    with PTSD.  Dr. Johnsie Cancel ordered repeat MRI brain 2017 (no acute, no change from prior MR done in 2012) and referred pt to neuro  for neuropsych testing but pt did not go.  Repeat MRI 10/2017 mild chronic microvasc isch change --stable compared to prior MRI.  Marland Kitchen Arthritis   . Bilateral carpal tunnel syndrome   . Cataracts, bilateral   . Chronic renal insufficiency, stage II (mild) 2015   CrCl about 60 ml/min  . Colitis    hx of  . DDD (degenerative disc disease), lumbar 2016   L1-2 and L5-S1 fairly severe--intramuscular injection of steroid and toradol by Dr. Antonietta Jewel 02/24/14 to calm down the pain  . Enterocele 05/2016   Dx'd by Dr. Toney Rakes (GYN); he referred pt back to the MD at [facility] that did her prior bladder sling procedure.  No rectocele.  Marland Kitchen GERD (gastroesophageal reflux disease)   . Goiter    Lobectomy (left) of thyroid for benign nodule.  Right lobe nodules followed by Dr. Chalmers Cater (FNA 2012 benign goiter) with ultrasounds and have been stable, most recently 01/10/13.    . H/O adenomatous polyp of colon 2012; 05/2016   5 yr recall  . Hypercalcemia 10/2017   suspected to be due to lithium toxicity.  . Hyperlipidemia   . Hypertension   . Lithium toxicity 2019  . Lumbar spondylolysis 2016   L5: with grade I spondylolisthesis L5 on S1  . Osteoporosis 2012; 2018   2012-"penia".  2018 "porosis"--alendronate started 12/2016.  Repeat DEXA 12/2018.  Marland Kitchen PFO (patent foramen ovale) echo 03/21/05   "hole in heart" saw Dr. Sharee Holster [phone] [facility]. hospital.  Plavix med mgmt.  Dr. Johnsie Cancel repeated echo w/bubble study 05/2016 and it showed NO PFO or ASD.  Marland Kitchen Subclinical hypothyroidism    Dr. Chalmers Cater has her on 25 mcg synthroid qd as of 01/2014  . TIA (transient ischemic attack)    patient on plavix,  saw Dr. Isabell Jarvis at [facility].hospital in [place].  MRI brain 05/2016: mild chronic small vessel dz, o/w normal.  . Urinary tract infection    hx of  . Vertigo     Past Surgical History:  Procedure Laterality Date  . ABDOMINAL HYSTERECTOMY    . BLADDER SUSPENSION    . BREAST EXCISIONAL BIOPSY Right 1960`  . BREAST  EXCISIONAL BIOPSY Left   . BREAST SURGERY     "breast lumps removed"  . COLONOSCOPY W/ POLYPECTOMY  04/2011; 05/29/16   +Diverticulosis and int hem.  Adenomatous polyp 2012.  No polyps on repeat TCS 05/2016--recall 5 yrs (05/2021)  . DEXA  05/23/2011   2012 T-score -2.2. 12/2016 T-score -2.6  . DILATION AND CURETTAGE OF UTERUS    . diverticulosis  05/2016   Noted on colonoscopy  . RECTOCELE REPAIR     purse string  . THYROIDECTOMY     Left lobectomy  . TOTAL KNEE ARTHROPLASTY  07/26/2012   Procedure: TOTAL KNEE ARTHROPLASTY;  Surgeon: Yvette Rack., MD;  Location: [facility];  Service: Orthopedics;  Laterality: Right;  . TRANSTHORACIC ECHOCARDIOGRAM  05/24/2016   EF 60-65%, normal LV function.  No DD. No PFO or ASD (bubble study WAS done).    Outpatient Medications Prior to Visit  Medication Sig Dispense Refill  . amLODipine (NORVASC) 10 MG tablet Take 1 tablet (10 mg total) by mouth daily. 90 tablet 3  . aspirin EC 81 MG tablet Take 1 tablet (81 mg total) by mouth daily. 30 tablet 0  . atorvastatin (LIPITOR) 20 MG tablet Take 1 tablet (20 mg total) by mouth daily. 90 tablet 3  . busPIRone (BUSPAR) 10 MG tablet 1/2-1 tab po tid 270 tablet 1  . levothyroxine (SYNTHROID, LEVOTHROID) 50 MCG tablet Take 1 tablet (50 mcg total) by mouth daily before breakfast. 30 tablet 6  . losartan (COZAAR) 100 MG tablet Take 1 tablet (100 mg total) by mouth daily. 90 tablet 3  . Multiple Vitamin (MV-ONE) CAPS Take 1 capsule by mouth daily.    . Omega-3 Fatty Acids (FISH OIL) 500 MG CAPS Take 1 capsule by mouth daily.    Marland Kitchen venlafaxine XR (EFFEXOR-XR) 150 MG 24 hr capsule TAKE 1 CAPSULE BY MOUTH ONCE DAILY WITH BREAKFAST 90 capsule 3  . clonazePAM (KLONOPIN) 0.5 MG tablet 1-2 tabs po qhs prn insomnia (Patient not taking: Reported on 02/24/2019) 180 tablet 1   No facility-administered medications prior to visit.    Allergies  Allergen Reactions  . Lamictal [Lamotrigine] Other (See Comments)  . Lithium  Other (See Comments)    toxicity  . Phenylephrine Rash    Patient had allergic reaction to dilation combo drop. Please dilate with Tropicamide 0.5% only with Fluress and/or Proparacaine   . Sulfa Drugs Cross Reactors Rash    ROS As per HPI  PE: Blood pressure 129/81, pulse 68, temperature 99.1 F (37.3 C), temperature source Temporal, resp. rate 16, height 5' 1.5" (1.562 m), weight 159 lb 12.8 oz (72.5 kg), SpO2 98 %. Body mass index is 29.7 kg/m.  Gen: Alert, well appearing.  Patient is oriented to person, place, time, and situation. AFFECT: pleasant, lucid thought and speech.  No further exam today.  LABS:  Lab Results  Component Value Date   TSH 2.08 10/22/2017   Lab Results  Component Value Date   WBC 11.0 (H) 10/22/2017   HGB 14.3 10/22/2017   HCT 43.1 10/22/2017   MCV 89.9 10/22/2017   PLT 245.0 10/22/2017   Lab Results  Component Value Date   CREATININE 0.97 03/07/2019   BUN 17 03/07/2019   NA 143 03/07/2019   K 4.6 03/07/2019   CL 107 03/07/2019   CO2 27 03/07/2019   Lab Results  Component Value Date   ALT 17 03/07/2019   AST 18 03/07/2019   ALKPHOS 104 03/07/2019   BILITOT 0.8 03/07/2019   Lab Results  Component Value Date   CHOL 164 03/07/2019   Lab Results  Component Value Date   HDL 57.90 03/07/2019   Lab Results  Component Value Date   LDLCALC 84 03/07/2019   Lab Results  Component Value Date   TRIG 110.0 03/07/2019   Lab Results  Component Value Date   CHOLHDL 3 03/07/2019    IMPRESSION AND PLAN:  1) PTSD/MDD/GAD: she has poor insight, is often more angry than anything else. She does not like to be on medications but was agreeable to continuing everything as she is currently taking it. Declined my suggestion of psychiatry or psychology referral AGAIN. Gave emotional encouragement and some cognitive/behav suggestions to help her with things hopefully.  2) HTN: not ideal control, but she is really averse to adding any further bp  meds unless it starts getting further "out of control". BMET today.  3) CRI, GFR 60s-> avoids NSAIDs.  Hydrates adequately. BMET today.  4) HLD: tolerating statin.  Lipids and hepatic panel excellent 6 mo ago. I'll keep her on ASA for remote, although unclear, hx of TIAs.  An After Visit Summary was printed and given to the patient.  FOLLOW UP: Return in about 6 months (around 02/17/2020) for annual CPE (fasting).  Signed:  Crissie Sickles, MD           08/20/2019

## 2019-09-09 ENCOUNTER — Ambulatory Visit: Payer: Medicare Other

## 2019-09-18 ENCOUNTER — Ambulatory Visit: Payer: Medicare Other | Attending: Internal Medicine

## 2019-09-18 DIAGNOSIS — Z23 Encounter for immunization: Secondary | ICD-10-CM

## 2019-09-18 NOTE — Progress Notes (Signed)
   Covid-19 Vaccination Clinic  Name:  COREAN KINION    MRN: [id number] DOB: 08-24-1940  09/18/2019  Ms. Kulikowski was observed post Covid-19 immunization for 15 minutes without incidence. She was provided with Vaccine Information Sheet and instruction to access the V-Safe system.   Ms. Dunckel was instructed to call 911 with any severe reactions post vaccine: Marland Kitchen Difficulty breathing  . Swelling of your face and throat  . A fast heartbeat  . A bad rash all over your body  . Dizziness and weakness    Immunizations Administered    Name Date Dose VIS Date Route   Pfizer COVID-19 Vaccine 09/18/2019 12:17 PM 0.3 mL 07/25/2019 Intramuscular   Manufacturer: [place]   Lot: [id number]   [place]: [id number]

## 2019-10-13 ENCOUNTER — Ambulatory Visit: Payer: Medicare Other | Attending: Internal Medicine

## 2019-10-13 DIAGNOSIS — Z23 Encounter for immunization: Secondary | ICD-10-CM | POA: Insufficient documentation

## 2019-10-13 NOTE — Progress Notes (Signed)
   Covid-19 Vaccination Clinic  Name:  Carla Spencer    MRN: [id number] DOB: [birth year]  10/13/2019  Carla Spencer was observed post Covid-19 immunization for 15 minutes without incidence. She was provided with Vaccine Information Sheet and instruction to access the V-Safe system.   Carla Spencer was instructed to call 911 with any severe reactions post vaccine: Marland Kitchen Difficulty breathing  . Swelling of your face and throat  . A fast heartbeat  . A bad rash all over your body  . Dizziness and weakness    Immunizations Administered    Name Date Dose VIS Date Route   Pfizer COVID-19 Vaccine 10/13/2019  3:35 PM 0.3 mL 07/25/2019 Intramuscular   Manufacturer: [place]   Lot: [id number]   [place]: [id number]

## 2019-12-18 ENCOUNTER — Other Ambulatory Visit: Payer: Self-pay | Admitting: Endocrinology

## 2019-12-18 DIAGNOSIS — E049 Nontoxic goiter, unspecified: Secondary | ICD-10-CM

## 2020-02-04 ENCOUNTER — Other Ambulatory Visit: Payer: Medicare Other

## 2020-02-11 ENCOUNTER — Ambulatory Visit
Admission: RE | Admit: 2020-02-11 | Discharge: 2020-02-11 | Disposition: A | Discharge status: Home / Self Care | Payer: Medicare Other | Source: Ambulatory Visit | Attending: Endocrinology | Admitting: Endocrinology

## 2020-02-11 DIAGNOSIS — E049 Nontoxic goiter, unspecified: Secondary | ICD-10-CM

## 2020-02-25 DIAGNOSIS — I10 Essential (primary) hypertension: Secondary | ICD-10-CM | POA: Diagnosis not present

## 2020-02-25 DIAGNOSIS — E039 Hypothyroidism, unspecified: Secondary | ICD-10-CM | POA: Diagnosis not present

## 2020-02-25 DIAGNOSIS — E049 Nontoxic goiter, unspecified: Secondary | ICD-10-CM | POA: Diagnosis not present

## 2020-05-08 ENCOUNTER — Other Ambulatory Visit: Payer: Self-pay | Admitting: Family Medicine

## 2020-05-19 DIAGNOSIS — L821 Other seborrheic keratosis: Secondary | ICD-10-CM | POA: Diagnosis not present

## 2020-05-19 DIAGNOSIS — L218 Other seborrheic dermatitis: Secondary | ICD-10-CM | POA: Diagnosis not present

## 2020-05-19 DIAGNOSIS — L918 Other hypertrophic disorders of the skin: Secondary | ICD-10-CM | POA: Diagnosis not present

## 2020-05-19 DIAGNOSIS — L720 Epidermal cyst: Secondary | ICD-10-CM | POA: Diagnosis not present

## 2020-05-19 DIAGNOSIS — L680 Hirsutism: Secondary | ICD-10-CM | POA: Diagnosis not present

## 2020-05-19 DIAGNOSIS — L814 Other melanin hyperpigmentation: Secondary | ICD-10-CM | POA: Diagnosis not present

## 2020-06-08 ENCOUNTER — Telehealth: Payer: Self-pay | Admitting: Family Medicine

## 2020-06-08 NOTE — Telephone Encounter (Signed)
Patient would like call back from office. Dr. Anitra Lauth told her to be seen before current anxiety med runs out. She has appt scheduled for 06/10/20 but would still like a call today regarding med questions.

## 2020-06-08 NOTE — Telephone Encounter (Signed)
Tried calling patient, unable to leave message.

## 2020-06-08 NOTE — Telephone Encounter (Signed)
Patient missed call from our office and returned call.

## 2020-06-09 NOTE — Telephone Encounter (Signed)
Spoke with patient and she had concerns regarding not being able to sleep and believes she may have alzheimer's. She still has old prescription for clonazepam from 2019 but was not sure if medication was too old so has not taken any. She has appt for tomorrow but would like to know what to do regarding sleep.   Please advise, thanks.

## 2020-06-10 ENCOUNTER — Encounter: Payer: Self-pay | Admitting: Family Medicine

## 2020-06-10 ENCOUNTER — Ambulatory Visit (INDEPENDENT_AMBULATORY_CARE_PROVIDER_SITE_OTHER): Payer: Medicare Other | Admitting: Family Medicine

## 2020-06-10 ENCOUNTER — Other Ambulatory Visit: Payer: Self-pay

## 2020-06-10 VITALS — BP 126/70 | HR 88 | Temp 98.1°F | Resp 16 | Ht 61.5 in | Wt 158.6 lb

## 2020-06-10 DIAGNOSIS — F3161 Bipolar disorder, current episode mixed, mild: Secondary | ICD-10-CM | POA: Diagnosis not present

## 2020-06-10 DIAGNOSIS — F339 Major depressive disorder, recurrent, unspecified: Secondary | ICD-10-CM | POA: Diagnosis not present

## 2020-06-10 DIAGNOSIS — F4310 Post-traumatic stress disorder, unspecified: Secondary | ICD-10-CM | POA: Diagnosis not present

## 2020-06-10 DIAGNOSIS — F411 Generalized anxiety disorder: Secondary | ICD-10-CM | POA: Diagnosis not present

## 2020-06-10 DIAGNOSIS — F410 Panic disorder [episodic paroxysmal anxiety] without agoraphobia: Secondary | ICD-10-CM

## 2020-06-10 MED ORDER — RISPERIDONE 0.25 MG PO TBDP: ORAL_TABLET | ORAL | 0 refills | Status: DC

## 2020-06-10 MED ORDER — CLONAZEPAM 0.5 MG PO TABS: ORAL_TABLET | ORAL | 1 refills | Status: DC

## 2020-06-10 NOTE — Progress Notes (Signed)
OFFICE VISIT  06/10/2020  CC:  Chief Complaint  Patient presents with  . Follow-up    depression, anxiety    HPI:    Patient is a 79 y.o. Caucasian female with PTSD who presents for ongoing severe anxiety, depression, and now she wonders if she is developing dementia. Pt has long hx of severe anx/dep, never really responding to or able to tolerate treatment sufficiently--see my past (many) notes for details. Most recently visit 08/20/19. A/P as of that visit: "1) PTSD/MDD/GAD: she has poor insight, is often more angry than anything else. She does not like to be on medications but was agreeable to continuing everything as she is currently taking it. Declined my suggestion of psychiatry or psychology referral AGAIN. Gave emotional encouragement and some cognitive/behav suggestions to help her with things hopefully.  2) HTN: not ideal control, but she is really averse to adding any further bp meds unless it starts getting further "out of control". BMET today.  3) CRI, GFR 60s-> avoids NSAIDs.  Hydrates adequately. BMET today.  4) HLD: tolerating statin.  Lipids and hepatic panel excellent 6 mo ago. I'll keep her on ASA for remote, although unclear, hx of TIAs."  INTERIM HX: Worse anxiety lately, hard to tell how long worse.  Very poor sleep.  Panicy at times: heart beats fast, starts crying, gets frustrated and angry. No known trigger or new life circumstance, no new med recently.  Depressed worse but says b/c so frustrated about unknown reason for her recent worse in anxiety, poor sleep.  No SI or HI. Frustration and anger are big things for her now--"things are not making any sense" as far as why she is worse lately.  No alcohol. No preceding illness. Is reliving her past emotional trauma more lately.  Denies paranoia, denies hallucinations. Less talkative than her usual.  Has inner urge to keep moving but at the same time feels less interested in doing the things she usually  likes doing.  Buspar--taking but sounds like not regularly prior to her worsening recently. effexor xr--taking. Clonazepam--not taking on a long time.  Past Medical History:  Diagnosis Date  . Anxiety and depression    with PTSD.  Dr. Johnsie Cancel ordered repeat MRI brain 2017 (no acute, no change from prior MR done in 2012) and referred pt to neuro for neuropsych testing but pt did not go.  Repeat MRI 10/2017 mild chronic microvasc isch change --stable compared to prior MRI.  Marland Kitchen Arthritis   . Bilateral carpal tunnel syndrome   . Cataracts, bilateral   . Chronic renal insufficiency, stage II (mild) 2015   CrCl about 60 ml/min  . Colitis    hx of  . DDD (degenerative disc disease), lumbar 2016   L1-2 and L5-S1 fairly severe--intramuscular injection of steroid and toradol by Dr. Antonietta Jewel 02/24/14 to calm down the pain  . Enterocele 05/2016   Dx'd by Dr. Toney Rakes (GYN); he referred pt back to the MD at [facility] that did her prior bladder sling procedure.  No rectocele.  Marland Kitchen GERD (gastroesophageal reflux disease)   . Goiter    Lobectomy (left) of thyroid for benign nodule.  Right lobe nodules followed by Dr. Chalmers Cater (FNA 2012 benign goiter) with ultrasounds and have been stable, most recently 01/10/13.    . H/O adenomatous polyp of colon 2012; 05/2016   5 yr recall  . Hypercalcemia 10/2017   suspected to be due to lithium toxicity.  . Hyperlipidemia   . Hypertension   . Lithium  toxicity 2019  . Lumbar spondylolysis 2016   L5: with grade I spondylolisthesis L5 on S1  . Osteoporosis 2012; 2018   2012-"penia".  2018 "porosis"--alendronate started 12/2016.  Repeat DEXA 12/2018.  Marland Kitchen PFO (patent foramen ovale) echo 03/21/05   "hole in heart" saw Dr. Sharee Holster [phone] [facility]. hospital.  Plavix med mgmt.  Dr. Johnsie Cancel repeated echo w/bubble study 05/2016 and it showed NO PFO or ASD.  Marland Kitchen Subclinical hypothyroidism    Dr. Chalmers Cater has her on 25 mcg synthroid qd as of 01/2014  . TIA (transient ischemic  attack)    patient on plavix,  saw Dr. Isabell Jarvis at [facility].hospital in [place].  MRI brain 05/2016: mild chronic small vessel dz, o/w normal.  . Urinary tract infection    hx of  . Vertigo     Past Surgical History:  Procedure Laterality Date  . ABDOMINAL HYSTERECTOMY    . BLADDER SUSPENSION    . BREAST EXCISIONAL BIOPSY Right 1960`  . BREAST EXCISIONAL BIOPSY Left   . BREAST SURGERY     "breast lumps removed"  . COLONOSCOPY W/ POLYPECTOMY  04/2011; 05/29/16   +Diverticulosis and int hem.  Adenomatous polyp 2012.  No polyps on repeat TCS 05/2016--recall 5 yrs (05/2021)  . DEXA  05/23/2011   2012 T-score -2.2. 12/2016 T-score -2.6  . DILATION AND CURETTAGE OF UTERUS    . diverticulosis  05/2016   Noted on colonoscopy  . RECTOCELE REPAIR     purse string  . THYROIDECTOMY     Left lobectomy  . TOTAL KNEE ARTHROPLASTY  07/26/2012   Procedure: TOTAL KNEE ARTHROPLASTY;  Surgeon: Yvette Rack., MD;  Location: [place];  Service: Orthopedics;  Laterality: Right;  . TRANSTHORACIC ECHOCARDIOGRAM  05/24/2016   EF 60-65%, normal LV function.  No DD. No PFO or ASD (bubble study WAS done).    Outpatient Medications Prior to Visit  Medication Sig Dispense Refill  . amLODipine (NORVASC) 10 MG tablet Take 1 tablet (10 mg total) by mouth daily. 90 tablet 3  . aspirin EC 81 MG tablet Take 1 tablet (81 mg total) by mouth daily. 30 tablet 0  . atorvastatin (LIPITOR) 20 MG tablet Take 1 tablet (20 mg total) by mouth daily. 90 tablet 3  . busPIRone (BUSPAR) 10 MG tablet TAKE 1/2 TO 1 (ONE-HALF TO ONE) TABLET BY MOUTH THREE TIMES DAILY. OFFICE VISIT NEEDED 90 tablet 0  . levothyroxine (SYNTHROID, LEVOTHROID) 50 MCG tablet Take 1 tablet (50 mcg total) by mouth daily before breakfast. 30 tablet 6  . losartan (COZAAR) 100 MG tablet Take 1 tablet (100 mg total) by mouth daily. 90 tablet 3  . Multiple Vitamin (MV-ONE) CAPS Take 1 capsule by mouth daily.    . Omega-3 Fatty Acids (FISH OIL) 500 MG CAPS  Take 1 capsule by mouth daily.    Marland Kitchen venlafaxine XR (EFFEXOR-XR) 150 MG 24 hr capsule TAKE 1 CAPSULE BY MOUTH ONCE DAILY WITH BREAKFAST 90 capsule 3  . clonazePAM (KLONOPIN) 0.5 MG tablet 1-2 tabs po qhs prn insomnia (Patient not taking: Reported on 06/10/2020) 180 tablet 1   No facility-administered medications prior to visit.    Allergies  Allergen Reactions  . Lamictal [Lamotrigine] Other (See Comments)  . Lithium Other (See Comments)    toxicity  . Phenylephrine Rash    Patient had allergic reaction to dilation combo drop. Please dilate with Tropicamide 0.5% only with Fluress and/or Proparacaine   . Sulfa Drugs Cross Reactors Rash  ROS As per HPI  PE: Vitals with BMI 06/10/2020 08/20/2019 02/24/2019  Height 5' 1.5" 5' 1.5" -  Weight 158 lbs 10 oz 159 lbs 13 oz 154 lbs  BMI 09.29 57.47 -  Systolic 340 370 964  Diastolic 70 81 75  Pulse 88 68 74  Some encounter information is confidential and restricted. Go to Review Flowsheets activity to see all data.     Gen: Alert, well appearing.  Patient is oriented to person, place, time, and situation. AFFECT: lucid thought and speech. Frustrated, on verge of crying a couple of times but held it back. Well kempt.  LABS:    Chemistry      Component Value Date/Time   NA 141 08/20/2019 1125   K 5.0 08/20/2019 1125   CL 106 08/20/2019 1125   CO2 29 08/20/2019 1125   BUN 15 08/20/2019 1125   CREATININE 0.85 08/20/2019 1125   CREATININE 0.88 07/02/2015 0001      Component Value Date/Time   CALCIUM 10.1 08/20/2019 1125   ALKPHOS 104 03/07/2019 0927   AST 18 03/07/2019 0927   ALT 17 03/07/2019 0927   BILITOT 0.8 03/07/2019 0927     Lab Results  Component Value Date   CHOL 164 03/07/2019   HDL 57.90 03/07/2019   LDLCALC 84 03/07/2019   TRIG 110.0 03/07/2019   CHOLHDL 3 03/07/2019   Lab Results  Component Value Date   TSH 2.08 10/22/2017    IMPRESSION AND PLAN:  Acute on chronic severe anxiety, panic, PTSD,  depression. Question of component of bipolar--mixed type. Will do trial of low dose risperidone (0.25mg ) every evening and gradually up-titrate. Therapeutic expectations and side effect profile of medication discussed today.  Patient's questions answered. Continue all current meds and I gave new rx for her clonazepam b/c her pills at home have expired. She will take this 0.5mg , 1-2 bid prn. She continues to decline any referral to counseling/psychologist.  Spent 32 min with pt today reviewing HPI, reviewing relevant past history, doing exam, reviewing and discussing lab and imaging data, and formulating plans.   An After Visit Summary was printed and given to the patient.  FOLLOW UP: No follow-ups on file.  Signed:  Crissie Sickles, MD           06/10/2020

## 2020-06-10 NOTE — Telephone Encounter (Signed)
Discussed with pt at o/v today. Signed:  Crissie Sickles, MD           06/10/2020

## 2020-06-11 ENCOUNTER — Other Ambulatory Visit: Payer: Self-pay | Admitting: Family Medicine

## 2020-06-18 ENCOUNTER — Telehealth: Payer: Self-pay

## 2020-06-18 NOTE — Telephone Encounter (Signed)
Based on my recent evaluation of her I think it is best to proceed with taking the risperidone as planned.

## 2020-06-18 NOTE — Telephone Encounter (Signed)
Patient advised and voiced understanding.  

## 2020-06-18 NOTE — Telephone Encounter (Signed)
Pt was last seen 10/28, it was mentioned in last o/v:  "Will do trial of low dose risperidone (0.25mg ) every evening and gradually up-titrate. Therapeutic expectations and side effect profile of medication discussed today.  Patient's questions answered. Continue all current meds and I gave new rx for her clonazepam b/c her pills at home have expired. She will take this 0.5mg , 1-2 bid prn. She continues to decline any referral to counseling/psychologist."   Please advise, thanks.

## 2020-06-18 NOTE — Telephone Encounter (Signed)
Patient was seen last week, 1 prescription was out of stock ---  Risperidone 0.25 MG TBDP [[id number]]   Patient was able to pick up 3 days ago, however, she feels that 2 other meds are working.  Does she need to start Risperidone? Does she need to keep appt next week 11/10 if meds are working?  [phone] call patient  thanks

## 2020-06-21 NOTE — Telephone Encounter (Signed)
Patient has done some research about risperidone prescription and has come to her on conclusion she does not want to take this drug.  She states she is feeling great and with no problems.  Does she need to keep appt scheduled this week 11/10?

## 2020-06-21 NOTE — Telephone Encounter (Signed)
Please advise if you would like patient to keep appt on 11/10?

## 2020-06-21 NOTE — Telephone Encounter (Signed)
No, okay to cancel appt

## 2020-06-23 ENCOUNTER — Other Ambulatory Visit: Payer: Self-pay

## 2020-06-23 ENCOUNTER — Ambulatory Visit (INDEPENDENT_AMBULATORY_CARE_PROVIDER_SITE_OTHER): Payer: Medicare Other | Admitting: Family Medicine

## 2020-06-23 ENCOUNTER — Encounter: Payer: Self-pay | Admitting: Family Medicine

## 2020-06-23 VITALS — BP 111/70 | HR 77 | Temp 97.8°F | Resp 16 | Ht 61.5 in | Wt 158.4 lb

## 2020-06-23 DIAGNOSIS — F410 Panic disorder [episodic paroxysmal anxiety] without agoraphobia: Secondary | ICD-10-CM

## 2020-06-23 NOTE — Progress Notes (Signed)
OFFICE VISIT  06/23/2020  CC:  Chief Complaint  Patient presents with  . Follow-up    anxiety/depression   HPI:    Patient is a 79 y.o. Caucasian female who presents for 2 wk f/u severe anxiety, MDD, PTSD, panic. A/P as of last visit: "Acute on chronic severe anxiety, panic, PTSD, depression. Question of component of bipolar--mixed type. Will do trial of low dose risperidone (0.25mg ) every evening and gradually up-titrate. Therapeutic expectations and side effect profile of medication discussed today.  Patient's questions answered. Continue all current meds and I gave new rx for her clonazepam b/c her pills at home have expired. She will take this 0.5mg , 1-2 bid prn. She continues to decline any referral to counseling/psychologist."  INTERIM HX: She says she is feeling better, has slept all night since I last saw her. Did well at a family function recently.  Anxiety levels/panic/irritability/anger have all essentially returned to her baseline. Pharmacy did not have risperdal avail and while she waited a few days for the med she read about risperdal and got afraid of it.  No new complaints.  Past Medical History:  Diagnosis Date  . Anxiety and depression    with PTSD.  Dr. Johnsie Cancel ordered repeat MRI brain 2017 (no acute, no change from prior MR done in 2012) and referred pt to neuro for neuropsych testing but pt did not go.  Repeat MRI 10/2017 mild chronic microvasc isch change --stable compared to prior MRI.  Marland Kitchen Arthritis   . Bilateral carpal tunnel syndrome   . Cataracts, bilateral   . Chronic renal insufficiency, stage II (mild) 2015   CrCl about 60 ml/min  . Colitis    hx of  . DDD (degenerative disc disease), lumbar 2016   L1-2 and L5-S1 fairly severe--intramuscular injection of steroid and toradol by Dr. Antonietta Jewel 02/24/14 to calm down the pain  . Enterocele 05/2016   Dx'd by Dr. Toney Rakes (GYN); he referred pt back to the MD at [facility] that did her prior bladder sling  procedure.  No rectocele.  Marland Kitchen GERD (gastroesophageal reflux disease)   . Goiter    Lobectomy (left) of thyroid for benign nodule.  Right lobe nodules followed by Dr. Chalmers Cater (FNA 2012 benign goiter) with ultrasounds and have been stable, most recently 01/10/13.    . H/O adenomatous polyp of colon 2012; 05/2016   5 yr recall  . Hypercalcemia 10/2017   suspected to be due to lithium toxicity.  . Hyperlipidemia   . Hypertension   . Lithium toxicity 2019  . Lumbar spondylolysis 2016   L5: with grade I spondylolisthesis L5 on S1  . Osteoporosis 2012; 2018   2012-"penia".  2018 "porosis"--alendronate started 12/2016.  Repeat DEXA 12/2018.  Marland Kitchen PFO (patent foramen ovale) echo 03/21/05   "hole in heart" saw Dr. Sharee Holster [phone] [facility]. hospital.  Plavix med mgmt.  Dr. Johnsie Cancel repeated echo w/bubble study 05/2016 and it showed NO PFO or ASD.  Marland Kitchen Subclinical hypothyroidism    Dr. Chalmers Cater has her on 25 mcg synthroid qd as of 01/2014  . TIA (transient ischemic attack)    patient on plavix,  saw Dr. Isabell Jarvis at [facility].hospital in [place].  MRI brain 05/2016: mild chronic small vessel dz, o/w normal.  . Urinary tract infection    hx of  . Vertigo     Past Surgical History:  Procedure Laterality Date  . ABDOMINAL HYSTERECTOMY    . BLADDER SUSPENSION    . BREAST EXCISIONAL BIOPSY Right 1960`  .  BREAST EXCISIONAL BIOPSY Left   . BREAST SURGERY     "breast lumps removed"  . COLONOSCOPY W/ POLYPECTOMY  04/2011; 05/29/16   +Diverticulosis and int hem.  Adenomatous polyp 2012.  No polyps on repeat TCS 05/2016--recall 5 yrs (05/2021)  . DEXA  05/23/2011   2012 T-score -2.2. 12/2016 T-score -2.6  . DILATION AND CURETTAGE OF UTERUS    . diverticulosis  05/2016   Noted on colonoscopy  . RECTOCELE REPAIR     purse string  . THYROIDECTOMY     Left lobectomy  . TOTAL KNEE ARTHROPLASTY  07/26/2012   Procedure: TOTAL KNEE ARTHROPLASTY;  Surgeon: Yvette Rack., MD;  Location: [place];  Service:  Orthopedics;  Laterality: Right;  . TRANSTHORACIC ECHOCARDIOGRAM  05/24/2016   EF 60-65%, normal LV function.  No DD. No PFO or ASD (bubble study WAS done).    Outpatient Medications Prior to Visit  Medication Sig Dispense Refill  . amLODipine (NORVASC) 10 MG tablet Take 1 tablet (10 mg total) by mouth daily. 90 tablet 3  . aspirin EC 81 MG tablet Take 1 tablet (81 mg total) by mouth daily. 30 tablet 0  . atorvastatin (LIPITOR) 20 MG tablet Take 1 tablet (20 mg total) by mouth daily. 90 tablet 3  . busPIRone (BUSPAR) 10 MG tablet TAKE 1/2 TO 1 (ONE-HALF TO ONE) TABLET BY MOUTH THREE TIMES DAILY. OFFICE VISIT NEEDED 90 tablet 0  . clonazePAM (KLONOPIN) 0.5 MG tablet 1-2 tabs po bid as needed for anxiety or insomnia 30 tablet 1  . levothyroxine (SYNTHROID, LEVOTHROID) 50 MCG tablet Take 1 tablet (50 mcg total) by mouth daily before breakfast. 30 tablet 6  . losartan (COZAAR) 100 MG tablet Take 1 tablet by mouth once daily 90 tablet 0  . Multiple Vitamin (MV-ONE) CAPS Take 1 capsule by mouth daily.    . Omega-3 Fatty Acids (FISH OIL) 500 MG CAPS Take 1 capsule by mouth daily.    Marland Kitchen venlafaxine XR (EFFEXOR-XR) 150 MG 24 hr capsule TAKE 1 CAPSULE BY MOUTH ONCE DAILY WITH BREAKFAST 90 capsule 0  . Risperidone 0.25 MG TBDP 1 tab po every evening (Patient not taking: Reported on 06/23/2020) 15 tablet 0   No facility-administered medications prior to visit.    Allergies  Allergen Reactions  . Lamictal [Lamotrigine] Other (See Comments)  . Lithium Other (See Comments)    toxicity  . Phenylephrine Rash    Patient had allergic reaction to dilation combo drop. Please dilate with Tropicamide 0.5% only with Fluress and/or Proparacaine   . Sulfa Drugs Cross Reactors Rash    ROS As per HPI  PE: Vitals with BMI 06/23/2020 06/10/2020 08/20/2019  Height 5' 1.5" 5' 1.5" 5' 1.5"  Weight 158 lbs 6 oz 158 lbs 10 oz 159 lbs 13 oz  BMI 29.45 16.10 96.04  Systolic 540 981 191  Diastolic 70 70 81  Pulse  77 88 68  Some encounter information is confidential and restricted. Go to Review Flowsheets activity to see all data.     Gen: Alert, well appearing.  Patient is oriented to person, place, time, and situation. AFFECT: pleasant, lucid thought and speech. No further exam today.  LABS:    Chemistry      Component Value Date/Time   NA 141 08/20/2019 1125   K 5.0 08/20/2019 1125   CL 106 08/20/2019 1125   CO2 29 08/20/2019 1125   BUN 15 08/20/2019 1125   CREATININE 0.85 08/20/2019 1125   CREATININE  0.88 07/02/2015 0001      Component Value Date/Time   CALCIUM 10.1 08/20/2019 1125   ALKPHOS 104 03/07/2019 0927   AST 18 03/07/2019 0927   ALT 17 03/07/2019 0927   BILITOT 0.8 03/07/2019 0927     Lab Results  Component Value Date   CHOL 164 03/07/2019   HDL 57.90 03/07/2019   LDLCALC 84 03/07/2019   TRIG 110.0 03/07/2019   CHOLHDL 3 03/07/2019    IMPRESSION AND PLAN:  1) Severe anxiety/PTSD/panic, with recurrent MDD and possible bipolar II. She is better, she never started risperdal. Encouraged her to continue to take all her current psych meds (clonaz, suspar, venlafaxine EVERY DAY as rx'd.  No new meds/treatments/testing today.  An After Visit Summary was printed and given to the patient.  FOLLOW UP: Return for f/u 2-3 mo for CPE and RCI--FASTING.  Signed:  Crissie Sickles, MD           06/23/2020

## 2020-06-29 ENCOUNTER — Ambulatory Visit: Payer: Medicare Other | Attending: Internal Medicine

## 2020-06-29 ENCOUNTER — Other Ambulatory Visit (HOSPITAL_BASED_OUTPATIENT_CLINIC_OR_DEPARTMENT_OTHER): Payer: Self-pay | Admitting: Internal Medicine

## 2020-06-29 DIAGNOSIS — Z23 Encounter for immunization: Secondary | ICD-10-CM

## 2020-06-29 MED FILL — PFIZER-BIONTECH COVID-19 VA: 30 | 1 days supply | Qty: 0 | Fill #0

## 2020-06-29 NOTE — Progress Notes (Signed)
° °  Covid-19 Vaccination Clinic  Name:  Carla Spencer    MRN: [id number] DOB: 1941-03-29  06/29/2020  [name] was observed post Covid-19 immunization for 15 minutes without incident. She was provided with Vaccine Information Sheet and instruction to access the V-Safe system.   [name] was instructed to call 911 with any severe reactions post vaccine:  Difficulty breathing   Swelling of face and throat   A fast heartbeat   A bad rash all over body   Dizziness and weakness   Immunizations Administered    Name Date Dose VIS Date Route   Pfizer COVID-19 Vaccine 06/29/2020 10:26 AM 0.3 mL 06/02/2020 Intramuscular   Manufacturer: [place]   Lot: [id number]   NDC: [address]-2

## 2020-07-06 ENCOUNTER — Other Ambulatory Visit: Payer: Self-pay | Admitting: Family Medicine

## 2020-07-06 DIAGNOSIS — Z1231 Encounter for screening mammogram for malignant neoplasm of breast: Secondary | ICD-10-CM

## 2020-07-15 ENCOUNTER — Other Ambulatory Visit: Payer: Self-pay | Admitting: Family Medicine

## 2020-07-15 ENCOUNTER — Telehealth: Payer: Self-pay | Admitting: Family Medicine

## 2020-07-15 NOTE — Telephone Encounter (Signed)
Attempted to schedule AWV. Unable to LVM.  Will try at later time.   Called patient to schedule Annual Wellness Visit.  Please schedule with Nurse Health Advisor Martha Stanley, RN at [place]  

## 2020-07-21 ENCOUNTER — Other Ambulatory Visit: Payer: Self-pay | Admitting: Family Medicine

## 2020-08-20 ENCOUNTER — Ambulatory Visit
Admission: RE | Admit: 2020-08-20 | Discharge: 2020-08-20 | Disposition: A | Discharge status: Home / Self Care | Payer: Medicare Other | Source: Ambulatory Visit | Attending: Family Medicine | Admitting: Family Medicine

## 2020-08-20 ENCOUNTER — Other Ambulatory Visit: Payer: Self-pay

## 2020-08-20 DIAGNOSIS — Z1231 Encounter for screening mammogram for malignant neoplasm of breast: Secondary | ICD-10-CM

## 2020-08-24 ENCOUNTER — Encounter: Payer: Medicare Other | Admitting: Family Medicine

## 2020-08-31 ENCOUNTER — Other Ambulatory Visit: Payer: Self-pay | Admitting: Family Medicine

## 2020-09-07 ENCOUNTER — Other Ambulatory Visit: Payer: Self-pay

## 2020-09-08 ENCOUNTER — Encounter: Payer: Self-pay | Admitting: Family Medicine

## 2020-09-08 ENCOUNTER — Ambulatory Visit (INDEPENDENT_AMBULATORY_CARE_PROVIDER_SITE_OTHER): Payer: Medicare Other | Admitting: Family Medicine

## 2020-09-08 VITALS — BP 132/75 | HR 79 | Temp 98.0°F | Resp 16 | Ht 61.0 in | Wt 150.2 lb

## 2020-09-08 DIAGNOSIS — I10 Essential (primary) hypertension: Secondary | ICD-10-CM | POA: Diagnosis not present

## 2020-09-08 DIAGNOSIS — N182 Chronic kidney disease, stage 2 (mild): Secondary | ICD-10-CM

## 2020-09-08 DIAGNOSIS — E7800 Pure hypercholesterolemia, unspecified: Secondary | ICD-10-CM

## 2020-09-08 DIAGNOSIS — F332 Major depressive disorder, recurrent severe without psychotic features: Secondary | ICD-10-CM | POA: Diagnosis not present

## 2020-09-08 DIAGNOSIS — F4310 Post-traumatic stress disorder, unspecified: Secondary | ICD-10-CM

## 2020-09-08 DIAGNOSIS — Z0000 Encounter for general adult medical examination without abnormal findings: Secondary | ICD-10-CM | POA: Diagnosis not present

## 2020-09-08 DIAGNOSIS — F411 Generalized anxiety disorder: Secondary | ICD-10-CM

## 2020-09-08 LAB — CBC WITH DIFFERENTIAL/PLATELET
Basophils Absolute: 0.1 10*3/uL (ref 0.0–0.1)
Basophils Relative: 0.8 % (ref 0.0–3.0)
Eosinophils Absolute: 0.1 10*3/uL (ref 0.0–0.7)
Eosinophils Relative: 1.6 % (ref 0.0–5.0)
HCT: 43.2 % (ref 36.0–46.0)
Hemoglobin: 14.3 g/dL (ref 12.0–15.0)
Lymphocytes Relative: 20.2 % (ref 12.0–46.0)
Lymphs Abs: 1.5 10*3/uL (ref 0.7–4.0)
MCHC: 33.1 g/dL (ref 30.0–36.0)
MCV: 89.7 fl (ref 78.0–100.0)
Monocytes Absolute: 0.5 10*3/uL (ref 0.1–1.0)
Monocytes Relative: 7.3 % (ref 3.0–12.0)
Neutro Abs: 5.3 10*3/uL (ref 1.4–7.7)
Neutrophils Relative %: 70.1 % (ref 43.0–77.0)
Platelets: 235 10*3/uL (ref 150.0–400.0)
RBC: 4.81 Mil/uL (ref 3.87–5.11)
RDW: 13.8 % (ref 11.5–15.5)
WBC: 7.5 10*3/uL (ref 4.0–10.5)

## 2020-09-08 LAB — LIPID PANEL
Cholesterol: 153 mg/dL (ref 0–200)
HDL: 60.4 mg/dL (ref >39.00)
LDL Cholesterol: 69 mg/dL (ref 0–99)
NonHDL: 92.98
Total CHOL/HDL Ratio: 3
Triglycerides: 121 mg/dL (ref 0.0–149.0)
VLDL: 24.2 mg/dL (ref 0.0–40.0)

## 2020-09-08 LAB — COMPREHENSIVE METABOLIC PANEL
ALT: 15 U/L (ref 0–35)
AST: 14 U/L (ref 0–37)
Albumin: 4.5 g/dL (ref 3.5–5.2)
Alkaline Phosphatase: 94 U/L (ref 39–117)
BUN: 21 mg/dL (ref 6–23)
CO2: 27 mEq/L (ref 19–32)
Calcium: 10.4 mg/dL (ref 8.4–10.5)
Chloride: 104 mEq/L (ref 96–112)
Creatinine, Ser: 1.06 mg/dL (ref 0.40–1.20)
GFR: 50.8 mL/min — ABNORMAL LOW (ref >60.00)
Glucose, Bld: 80 mg/dL (ref 70–99)
Potassium: 4.5 mEq/L (ref 3.5–5.1)
Sodium: 140 mEq/L (ref 135–145)
Total Bilirubin: 0.7 mg/dL (ref 0.2–1.2)
Total Protein: 6.7 g/dL (ref 6.0–8.3)

## 2020-09-08 MED ORDER — RISPERIDONE 0.25 MG PO TBDP
ORAL_TABLET | ORAL | 0 refills | Status: DC
Start: 2020-09-08 — End: 2020-09-15
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
## 2020-09-14 ENCOUNTER — Other Ambulatory Visit: Payer: Self-pay

## 2020-09-15 ENCOUNTER — Ambulatory Visit (INDEPENDENT_AMBULATORY_CARE_PROVIDER_SITE_OTHER): Payer: Medicare Other | Admitting: Family Medicine

## 2020-09-15 ENCOUNTER — Encounter: Payer: Self-pay | Admitting: Family Medicine

## 2020-09-15 VITALS — BP 104/65 | HR 85 | Temp 98.0°F | Resp 16 | Ht 61.0 in | Wt 150.0 lb

## 2020-09-15 DIAGNOSIS — F411 Generalized anxiety disorder: Secondary | ICD-10-CM

## 2020-09-15 DIAGNOSIS — F332 Major depressive disorder, recurrent severe without psychotic features: Secondary | ICD-10-CM

## 2020-09-15 DIAGNOSIS — F4310 Post-traumatic stress disorder, unspecified: Secondary | ICD-10-CM | POA: Diagnosis not present

## 2020-09-15 MED ORDER — RISPERIDONE 0.5 MG PO TBDP: 0.5000 mg | ORAL_TABLET | Freq: Every day | ORAL | 0 refills | Status: DC

## 2020-09-15 NOTE — Progress Notes (Signed)
OFFICE VISIT  09/15/2020  CC:  Chief Complaint  Patient presents with  . Follow-up    Depression, medication is working well.    HPI:    Patient is a 80 y.o. Caucasian female who presents accompanied by her husband for 1 week f/u recurrent MDD. A/P as of last visit: "1) Severe recurrent MDD/treatment resistant, +PTSD, chronic anxiety. Historically she's been tough to get to take meds or stay on meds, etc. Will try again to start risperdal 0.25mg  qhs. Therapeutic expectations and side effect profile of medication discussed today.  Patient's questions answered. Continue effexor, buspar, and clonaz as currently taking.  2) HTN, HLD: tolerating meds, well controlled. Labs today.  3) Preventative health care: Vaccines: ALL UTD. Labs: cbc,cmet, flp. Cervical ca screening: she is s/p hysterectomy, has never had cervical dysplasia issues-->no cerv ca screening indicated. Breast ca screening: normal mammogram this month, plan rpt 08/2021. Colon ca screening: recall 05/2021 per GI rec's. Osteoporosis f/u: May 2018 T score -2.6, alendronate started.  Unclear if she actually took any of this med.  No rpt dexa at this time."  INTERIM HX: Doing a little better, not spiraling into frustrating/angry/crying spells as much, sleeping better.  Still mod depressed mood overall and mod anx level overall.   No side effects from risperdal (no lightheadedness, no oversedation, no tremor).  No home bp checks since I saw her last.    Past Medical History:  Diagnosis Date  . Anxiety and depression    with PTSD.  Dr. Johnsie Cancel ordered repeat MRI brain 2017 (no acute, no change from prior MR done in 2012) and referred pt to neuro for neuropsych testing but pt did not go.  Repeat MRI 10/2017 mild chronic microvasc isch change --stable compared to prior MRI.  Marland Kitchen Arthritis   . Bilateral carpal tunnel syndrome   . Cataracts, bilateral   . Chronic renal insufficiency, stage II (mild) 2015   CrCl about 60  ml/min  . Colitis    hx of  . DDD (degenerative disc disease), lumbar 2016   L1-2 and L5-S1 fairly severe--intramuscular injection of steroid and toradol by Dr. Antonietta Jewel 02/24/14 to calm down the pain  . Enterocele 05/2016   Dx'd by Dr. Toney Rakes (GYN); he referred pt back to the MD at [facility] that did her prior bladder sling procedure.  No rectocele.  Marland Kitchen GERD (gastroesophageal reflux disease)   . Goiter    Lobectomy (left) of thyroid for benign nodule.  Right lobe nodules followed by Dr. Chalmers Cater (FNA 2012 benign goiter) with ultrasounds and have been stable, most recently 01/10/13.    . H/O adenomatous polyp of colon 2012; 05/2016   5 yr recall  . Hypercalcemia 10/2017   suspected to be due to lithium toxicity.  . Hyperlipidemia   . Hypertension   . Lithium toxicity 2019  . Lumbar spondylolysis 2016   L5: with grade I spondylolisthesis L5 on S1  . Osteoporosis 2012; 2018   2012-"penia".  2018 "porosis"--alendronate started 12/2016.  Repeat DEXA 12/2018.  Marland Kitchen PFO (patent foramen ovale) echo 03/21/05   "hole in heart" saw Dr. Sharee Holster [phone] [facility]. hospital.  Plavix med mgmt.  Dr. Johnsie Cancel repeated echo w/bubble study 05/2016 and it showed NO PFO or ASD.  Marland Kitchen Subclinical hypothyroidism    Dr. Chalmers Cater has her on 25 mcg synthroid qd as of 01/2014  . TIA (transient ischemic attack)    patient on plavix,  saw Dr. Isabell Jarvis at [facility].hospital in [place].  MRI brain  05/2016: mild chronic small vessel dz, o/w normal.  . Urinary tract infection    hx of  . Vertigo     Past Surgical History:  Procedure Laterality Date  . ABDOMINAL HYSTERECTOMY    . BLADDER SUSPENSION    . BREAST EXCISIONAL BIOPSY Right 1960  . BREAST EXCISIONAL BIOPSY Left   . BREAST SURGERY     "breast lumps removed"  . COLONOSCOPY W/ POLYPECTOMY  04/2011; 05/29/16   +Diverticulosis and int hem.  Adenomatous polyp 2012.  No polyps on repeat TCS 05/2016--recall 5 yrs (05/2021)  . DEXA  05/23/2011   2012 T-score -2.2.  12/2016 T-score -2.6  . DILATION AND CURETTAGE OF UTERUS    . diverticulosis  05/2016   Noted on colonoscopy  . RECTOCELE REPAIR     purse string  . THYROIDECTOMY     Left lobectomy  . TOTAL KNEE ARTHROPLASTY  07/26/2012   Procedure: TOTAL KNEE ARTHROPLASTY;  Surgeon: Yvette Rack., MD;  Location: [place];  Service: Orthopedics;  Laterality: Right;  . TRANSTHORACIC ECHOCARDIOGRAM  05/24/2016   EF 60-65%, normal LV function.  No DD. No PFO or ASD (bubble study WAS done).    Outpatient Medications Prior to Visit  Medication Sig Dispense Refill  . amLODipine (NORVASC) 10 MG tablet Take 1 tablet (10 mg total) by mouth daily. 90 tablet 3  . aspirin EC 81 MG tablet Take 1 tablet (81 mg total) by mouth daily. 30 tablet 0  . atorvastatin (LIPITOR) 20 MG tablet Take 1 tablet by mouth once daily 90 tablet 0  . busPIRone (BUSPAR) 10 MG tablet TAKE 1/2 TO 1 (ONE-HALF TO ONE) TABLET BY MOUTH THREE TIMES DAILY . APPOINTMENT REQUIRED FOR FUTURE REFILLS 90 tablet 0  . clonazePAM (KLONOPIN) 0.5 MG tablet 1-2 tabs po bid as needed for anxiety or insomnia 30 tablet 1  . levothyroxine (SYNTHROID, LEVOTHROID) 50 MCG tablet Take 1 tablet (50 mcg total) by mouth daily before breakfast. 30 tablet 6  . losartan (COZAAR) 100 MG tablet Take 1 tablet by mouth once daily 90 tablet 0  . Multiple Vitamin (MV-ONE) CAPS Take 1 capsule by mouth daily.    . Omega-3 Fatty Acids (FISH OIL) 500 MG CAPS Take 1 capsule by mouth daily.    Marland Kitchen venlafaxine XR (EFFEXOR-XR) 150 MG 24 hr capsule TAKE 1 CAPSULE BY MOUTH ONCE DAILY WITH BREAKFAST 90 capsule 0  . Risperidone 0.25 MG TBDP 1 tab po every evening 15 tablet 0   No facility-administered medications prior to visit.    Allergies  Allergen Reactions  . Lamictal [Lamotrigine] Other (See Comments)  . Lithium Other (See Comments)    toxicity  . Phenylephrine Rash    Patient had allergic reaction to dilation combo drop. Please dilate with Tropicamide 0.5% only with Fluress  and/or Proparacaine   . Sulfa Drugs Cross Reactors Rash    ROS As per HPI  PE: Vitals with BMI 09/15/2020 09/08/2020 06/23/2020  Height 5\' 1"  5\' 1"  5' 1.5"  Weight 150 lbs 150 lbs 3 oz 158 lbs 6 oz  BMI 28.36 46.96 29.52  Systolic 841 324 401  Diastolic 65 75 70  Pulse 85 79 77  Some encounter information is confidential and restricted. Go to Review Flowsheets activity to see all data.    Wt Readings from Last 2 Encounters:  09/15/20 150 lb (68 kg)  09/08/20 150 lb 3.2 oz (68.1 kg)    Gen: alert, oriented x 4, affect pleasant.  Lucid  thinking and conversation noted. HEENT: PERRLA, EOMI.   Neck: no LAD, mass, or thyromegaly. CV: RRR, no m/r/g LUNGS: CTA bilat, nonlabored. NEURO: no tremor or tics noted on observation.  Coordination intact. CN 2-12 grossly intact bilaterally, strength 5/5 in all extremeties.  No ataxia.  LABS:  Lab Results  Component Value Date   TSH 2.08 10/22/2017   Lab Results  Component Value Date   WBC 7.5 09/08/2020   HGB 14.3 09/08/2020   HCT 43.2 09/08/2020   MCV 89.7 09/08/2020   PLT 235.0 09/08/2020   Lab Results  Component Value Date   CREATININE 1.06 09/08/2020   BUN 21 09/08/2020   NA 140 09/08/2020   K 4.5 09/08/2020   CL 104 09/08/2020   CO2 27 09/08/2020   Lab Results  Component Value Date   ALT 15 09/08/2020   AST 14 09/08/2020   ALKPHOS 94 09/08/2020   BILITOT 0.7 09/08/2020   Lab Results  Component Value Date   CHOL 153 09/08/2020   Lab Results  Component Value Date   HDL 60.40 09/08/2020   Lab Results  Component Value Date   LDLCALC 69 09/08/2020   Lab Results  Component Value Date   TRIG 121.0 09/08/2020   Lab Results  Component Value Date   CHOLHDL 3 09/08/2020    IMPRESSION AND PLAN:  1) PTSD, with recurrent MDD and GAD. Recent "flare" is calmed down since getting on risperdal 0.25mg  qhs and she is tolerating this well. Increase to 0.5mg  qhs risperdal and I'll reassess in 2 wks.  Would ideally like  to gradually titrate this up to 1 mg qhs. Continue effexor xr 150 qd, buspar tid, and clonaz (prn). She continues to decline any counseling.  An After Visit Summary was printed and given to the patient.  FOLLOW UP: Return in about 2 weeks (around 09/29/2020) for f/u dep/anx.  Signed:  Crissie Sickles, MD           09/15/2020

## 2020-09-27 ENCOUNTER — Other Ambulatory Visit: Payer: Self-pay

## 2020-09-30 ENCOUNTER — Ambulatory Visit (INDEPENDENT_AMBULATORY_CARE_PROVIDER_SITE_OTHER): Payer: Medicare Other | Admitting: Family Medicine

## 2020-09-30 ENCOUNTER — Encounter: Payer: Self-pay | Admitting: Family Medicine

## 2020-09-30 ENCOUNTER — Other Ambulatory Visit: Payer: Self-pay

## 2020-09-30 VITALS — BP 108/69 | HR 78 | Temp 97.9°F | Resp 16 | Ht 61.0 in | Wt 159.2 lb

## 2020-09-30 DIAGNOSIS — F5105 Insomnia due to other mental disorder: Secondary | ICD-10-CM

## 2020-09-30 DIAGNOSIS — T887XXA Unspecified adverse effect of drug or medicament, initial encounter: Secondary | ICD-10-CM

## 2020-09-30 DIAGNOSIS — F339 Major depressive disorder, recurrent, unspecified: Secondary | ICD-10-CM | POA: Diagnosis not present

## 2020-09-30 DIAGNOSIS — F4310 Post-traumatic stress disorder, unspecified: Secondary | ICD-10-CM | POA: Diagnosis not present

## 2020-09-30 DIAGNOSIS — F418 Other specified anxiety disorders: Secondary | ICD-10-CM

## 2020-09-30 DIAGNOSIS — F411 Generalized anxiety disorder: Secondary | ICD-10-CM | POA: Diagnosis not present

## 2020-09-30 MED ORDER — LOSARTAN POTASSIUM 100 MG PO TABS: 100.0000 mg | ORAL_TABLET | Freq: Every day | ORAL | 3 refills | Status: DC

## 2020-09-30 MED ORDER — ATORVASTATIN CALCIUM 20 MG PO TABS: 20.0000 mg | ORAL_TABLET | Freq: Every day | ORAL | 3 refills | Status: DC

## 2020-09-30 MED ORDER — RISPERIDONE 0.5 MG PO TBDP: 0.5000 mg | ORAL_TABLET | Freq: Every day | ORAL | 0 refills | Status: DC

## 2020-09-30 MED ORDER — BUSPIRONE HCL 10 MG PO TABS: ORAL_TABLET | ORAL | 3 refills | Status: DC

## 2020-09-30 NOTE — Progress Notes (Signed)
OFFICE VISIT  09/30/2020  CC:  Chief Complaint  Patient presents with  . Follow-up    Depression/anxiety   HPI:    Patient is a 80 y.o. Caucasian female with HTN, Hypothyroidism, HLD, and long hx of anxiety and depression who presents accompanied by her husband for 2 week f/u severe anxiety and depression/PTSD. A/P as of last visit: "1) PTSD, with recurrent MDD and GAD. Recent "flare" is calmed down since getting on risperdal 0.25mg  qhs and she is tolerating this well. Increase to 0.5mg  qhs risperdal and I'll reassess in 2 wks.  Would ideally like to gradually titrate this up to 1 mg qhs. Continue effexor xr 150 qd, buspar tid, and clonaz (prn). She continues to decline any counseling."  INTERIM HX: Continues to improve, esp getting better sleep. No probs tolerating risperdal.  Appetite is definitely increased on this med, wt is up 9 lbs in the last 2 wks. Not crying as easily.  Not reliving past traumas as often or for as long.  More motivated and energetic, cooking more, etc.        Past Medical History:  Diagnosis Date  . Anxiety and depression    with PTSD.  Dr. Johnsie Cancel ordered repeat MRI brain 2017 (no acute, no change from prior MR done in 2012) and referred pt to neuro for neuropsych testing but pt did not go.  Repeat MRI 10/2017 mild chronic microvasc isch change --stable compared to prior MRI.  Marland Kitchen Arthritis   . Bilateral carpal tunnel syndrome   . Cataracts, bilateral   . Chronic renal insufficiency, stage II (mild) 2015   CrCl about 60 ml/min  . Colitis    hx of  . DDD (degenerative disc disease), lumbar 2016   L1-2 and L5-S1 fairly severe--intramuscular injection of steroid and toradol by Dr. Antonietta Jewel 02/24/14 to calm down the pain  . Enterocele 05/2016   Dx'd by Dr. Toney Rakes (GYN); he referred pt back to the MD at [facility] that did her prior bladder sling procedure.  No rectocele.  Marland Kitchen GERD (gastroesophageal reflux disease)   . Goiter    Lobectomy (left) of thyroid  for benign nodule.  Right lobe nodules followed by Dr. Chalmers Cater (FNA 2012 benign goiter) with ultrasounds and have been stable, most recently 01/10/13.    . H/O adenomatous polyp of colon 2012; 05/2016   5 yr recall  . Hypercalcemia 10/2017   suspected to be due to lithium toxicity.  . Hyperlipidemia   . Hypertension   . Lithium toxicity 2019  . Lumbar spondylolysis 2016   L5: with grade I spondylolisthesis L5 on S1  . Osteoporosis 2012; 2018   2012-"penia".  2018 "porosis"--alendronate started 12/2016.  Repeat DEXA 12/2018.  Marland Kitchen PFO (patent foramen ovale) echo 03/21/05   "hole in heart" saw Dr. Sharee Holster [phone] [facility]. hospital.  Plavix med mgmt.  Dr. Johnsie Cancel repeated echo w/bubble study 05/2016 and it showed NO PFO or ASD.  Marland Kitchen Subclinical hypothyroidism    Dr. Chalmers Cater has her on 25 mcg synthroid qd as of 01/2014  . TIA (transient ischemic attack)    patient on plavix,  saw Dr. Isabell Jarvis at [facility].hospital in [place].  MRI brain 05/2016: mild chronic small vessel dz, o/w normal.  . Urinary tract infection    hx of  . Vertigo     Past Surgical History:  Procedure Laterality Date  . ABDOMINAL HYSTERECTOMY    . BLADDER SUSPENSION    . BREAST EXCISIONAL BIOPSY Right 1960  .  BREAST EXCISIONAL BIOPSY Left   . BREAST SURGERY     "breast lumps removed"  . COLONOSCOPY W/ POLYPECTOMY  04/2011; 05/29/16   +Diverticulosis and int hem.  Adenomatous polyp 2012.  No polyps on repeat TCS 05/2016--recall 5 yrs (05/2021)  . DEXA  05/23/2011   2012 T-score -2.2. 12/2016 T-score -2.6  . DILATION AND CURETTAGE OF UTERUS    . diverticulosis  05/2016   Noted on colonoscopy  . RECTOCELE REPAIR     purse string  . THYROIDECTOMY     Left lobectomy  . TOTAL KNEE ARTHROPLASTY  07/26/2012   Procedure: TOTAL KNEE ARTHROPLASTY;  Surgeon: Yvette Rack., MD;  Location: [place];  Service: Orthopedics;  Laterality: Right;  . TRANSTHORACIC ECHOCARDIOGRAM  05/24/2016   EF 60-65%, normal LV function.  No  DD. No PFO or ASD (bubble study WAS done).    Outpatient Medications Prior to Visit  Medication Sig Dispense Refill  . amLODipine (NORVASC) 10 MG tablet Take 1 tablet (10 mg total) by mouth daily. 90 tablet 3  . aspirin EC 81 MG tablet Take 1 tablet (81 mg total) by mouth daily. 30 tablet 0  . clonazePAM (KLONOPIN) 0.5 MG tablet 1-2 tabs po bid as needed for anxiety or insomnia 30 tablet 1  . levothyroxine (SYNTHROID, LEVOTHROID) 50 MCG tablet Take 1 tablet (50 mcg total) by mouth daily before breakfast. 30 tablet 6  . Multiple Vitamin (MV-ONE) CAPS Take 1 capsule by mouth daily.    . Omega-3 Fatty Acids (FISH OIL) 500 MG CAPS Take 1 capsule by mouth daily.    Marland Kitchen venlafaxine XR (EFFEXOR-XR) 150 MG 24 hr capsule TAKE 1 CAPSULE BY MOUTH ONCE DAILY WITH BREAKFAST 90 capsule 0  . atorvastatin (LIPITOR) 20 MG tablet Take 1 tablet by mouth once daily 90 tablet 0  . busPIRone (BUSPAR) 10 MG tablet TAKE 1/2 TO 1 (ONE-HALF TO ONE) TABLET BY MOUTH THREE TIMES DAILY . APPOINTMENT REQUIRED FOR FUTURE REFILLS 90 tablet 0  . losartan (COZAAR) 100 MG tablet Take 1 tablet by mouth once daily 90 tablet 0  . risperiDONE (RISPERDAL M-TABS) 0.5 MG disintegrating tablet Take 1 tablet (0.5 mg total) by mouth at bedtime. 30 tablet 0   No facility-administered medications prior to visit.    Allergies  Allergen Reactions  . Lamictal [Lamotrigine] Other (See Comments)  . Lithium Other (See Comments)    toxicity  . Phenylephrine Rash    Patient had allergic reaction to dilation combo drop. Please dilate with Tropicamide 0.5% only with Fluress and/or Proparacaine   . Sulfa Drugs Cross Reactors Rash    ROS As per HPI  PE: Vitals with BMI 09/30/2020 09/15/2020 09/08/2020  Height 5\' 1"  5\' 1"  5\' 1"   Weight 159 lbs 3 oz 150 lbs 150 lbs 3 oz  BMI 30.1 46.56 81.27  Systolic 517 001 749  Diastolic 69 65 75  Pulse 78 85 79  Some encounter information is confidential and restricted. Go to Review Flowsheets activity to  see all data.    Gen: Alert, well appearing.  Patient is oriented to person, place, time, and situation. AFFECT: pleasant, lucid thought and speech. CV: RRR, no m/r/g.   LUNGS: CTA bilat, nonlabored resps, good aeration in all lung fields. EXT: no clubbing or cyanosis.  Trace bilat LL pitting edema.    LABS:    Chemistry      Component Value Date/Time   NA 140 09/08/2020 1107   K 4.5 09/08/2020 1107  CL 104 09/08/2020 1107   CO2 27 09/08/2020 1107   BUN 21 09/08/2020 1107   CREATININE 1.06 09/08/2020 1107   CREATININE 0.88 07/02/2015 0001      Component Value Date/Time   CALCIUM 10.4 09/08/2020 1107   ALKPHOS 94 09/08/2020 1107   AST 14 09/08/2020 1107   ALT 15 09/08/2020 1107   BILITOT 0.7 09/08/2020 1107     Lab Results  Component Value Date   CHOL 153 09/08/2020   HDL 60.40 09/08/2020   LDLCALC 69 09/08/2020   TRIG 121.0 09/08/2020   CHOLHDL 3 09/08/2020   Lab Results  Component Value Date   TSH 2.08 10/22/2017  (hypoth managed by endo/Dr. Chalmers Cater)  IMPRESSION AND PLAN:  1) Recurrent MDD/GAD/PTSD/insomnia: continues to gradually improve since getting on risperdal about 3 wks ago. Tolerating risperdal 0.5mg  qhs and wants to hold off on further inc in dose b/c of her fear of wt gain on this med, which she is already experiencing (9 lb). Discussed this in depth today, emphasized need to stay on med for now and start to make dietary adjustments in prep for being on the med long term b/c it is really seeming to help her. She is agreeable. Cont risperdal 0.5mg  qhs->#90, no RF eRx'd today. She'll continue buspar 10mg  tid, venlafaxine xr 150mg  qd, and clonaz bid prn.  She'll be going to [place] with husband soon and be back early April so we'll see her when she gets back (about 2 mo).  An After Visit Summary was printed and given to the patient.  FOLLOW UP: Return in about 2 months (around 11/28/2020) for routine chronic illness f/u.  Signed:  Crissie Sickles, MD            09/30/2020

## 2020-10-20 ENCOUNTER — Other Ambulatory Visit: Payer: Self-pay | Admitting: Family Medicine

## 2020-11-04 ENCOUNTER — Telehealth: Payer: Self-pay | Admitting: Family Medicine

## 2020-11-04 NOTE — Telephone Encounter (Signed)
Spoke with patient's spouse he stated he will give her the message and she will call me back

## 2020-11-30 ENCOUNTER — Ambulatory Visit: Payer: BLUE CROSS/BLUE SHIELD | Admitting: Family Medicine

## 2020-12-08 ENCOUNTER — Encounter: Payer: Self-pay | Admitting: Family Medicine

## 2020-12-08 ENCOUNTER — Ambulatory Visit (INDEPENDENT_AMBULATORY_CARE_PROVIDER_SITE_OTHER): Payer: Medicare Other | Admitting: Family Medicine

## 2020-12-08 ENCOUNTER — Other Ambulatory Visit: Payer: Self-pay

## 2020-12-08 VITALS — BP 144/75 | HR 76 | Temp 97.7°F | Resp 16 | Ht 61.0 in | Wt 164.6 lb

## 2020-12-08 DIAGNOSIS — F5105 Insomnia due to other mental disorder: Secondary | ICD-10-CM | POA: Diagnosis not present

## 2020-12-08 DIAGNOSIS — F411 Generalized anxiety disorder: Secondary | ICD-10-CM

## 2020-12-08 DIAGNOSIS — F99 Mental disorder, not otherwise specified: Secondary | ICD-10-CM

## 2020-12-08 DIAGNOSIS — F339 Major depressive disorder, recurrent, unspecified: Secondary | ICD-10-CM

## 2020-12-08 DIAGNOSIS — F4310 Post-traumatic stress disorder, unspecified: Secondary | ICD-10-CM | POA: Diagnosis not present

## 2020-12-08 MED ORDER — VENLAFAXINE HCL ER 150 MG PO CP24: 150.0000 mg | ORAL_CAPSULE | Freq: Every day | ORAL | 3 refills | Status: DC
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
## 2020-12-15 ENCOUNTER — Ambulatory Visit: Payer: BLUE CROSS/BLUE SHIELD

## 2021-01-12 ENCOUNTER — Other Ambulatory Visit: Payer: Self-pay | Admitting: Family Medicine

## 2021-02-07 ENCOUNTER — Telehealth: Payer: Self-pay | Admitting: Family Medicine

## 2021-02-07 ENCOUNTER — Other Ambulatory Visit: Payer: Self-pay | Admitting: Family Medicine

## 2021-02-07 ENCOUNTER — Other Ambulatory Visit: Payer: Self-pay

## 2021-02-07 MED ORDER — RISPERIDONE 0.5 MG PO TBDP: 0.5000 mg | ORAL_TABLET | Freq: Every day | ORAL | 0 refills | Status: DC

## 2021-02-07 NOTE — Telephone Encounter (Signed)
Pt called and requested callback for question about a medication, callback [phone]

## 2021-02-07 NOTE — Telephone Encounter (Signed)
Pt called needing Rx refill

## 2021-02-16 DIAGNOSIS — E890 Postprocedural hypothyroidism: Secondary | ICD-10-CM | POA: Diagnosis not present

## 2021-03-01 ENCOUNTER — Other Ambulatory Visit: Payer: Self-pay | Admitting: Family Medicine

## 2021-03-02 ENCOUNTER — Ambulatory Visit (INDEPENDENT_AMBULATORY_CARE_PROVIDER_SITE_OTHER): Payer: Medicare Other | Admitting: *Deleted

## 2021-03-02 DIAGNOSIS — Z13820 Encounter for screening for osteoporosis: Secondary | ICD-10-CM

## 2021-03-02 DIAGNOSIS — M810 Age-related osteoporosis without current pathological fracture: Secondary | ICD-10-CM

## 2021-03-02 DIAGNOSIS — Z0000 Encounter for general adult medical examination without abnormal findings: Secondary | ICD-10-CM | POA: Diagnosis not present

## 2021-03-02 NOTE — Progress Notes (Signed)
Subjective:   Carla Spencer is a 80 y.o. female who presents for Medicare Annual (Subsequent) preventive examination.  I connected with  Carla Spencer on 03/02/21 by a telphone enabled telemedicine application and verified that I am speaking with the correct person using two identifiers.   I discussed the limitations of evaluation and management by telemedicine. The patient expressed understanding and agreed to proceed.   Review of Systems    NA Cardiac Risk Factors include: advanced age (>48men, >48 women);hypertension;dyslipidemia     Objective:    Today's Vitals   There is no height or weight on file to calculate BMI.  Advanced Directives 03/02/2021 07/26/2012 07/19/2012  Does Patient Have a Medical Advance Directive? Yes - Patient does not have advance directive  Type of Advance Directive [place];Living will - -  Copy of [place] in Chart? Yes - validated most recent copy scanned in chart (See row information) - -  Pre-existing out of facility DNR order (yellow form or pink MOST form) - No -    Current Medications (verified) Outpatient Encounter Medications as of 03/02/2021  Medication Sig   amLODipine (NORVASC) 10 MG tablet Take 1 tablet by mouth once daily   aspirin EC 81 MG tablet Take 1 tablet (81 mg total) by mouth daily.   busPIRone (BUSPAR) 10 MG tablet 1 tab po tid   levothyroxine (SYNTHROID, LEVOTHROID) 50 MCG tablet Take 1 tablet (50 mcg total) by mouth daily before breakfast.   losartan (COZAAR) 100 MG tablet Take 1 tablet (100 mg total) by mouth daily.   Multiple Vitamin (MV-ONE) CAPS Take 1 capsule by mouth daily.   Omega-3 Fatty Acids (FISH OIL) 500 MG CAPS Take 1 capsule by mouth daily.   risperiDONE (RISPERDAL M-TABS) 0.5 MG disintegrating tablet Take 1 tablet (0.5 mg total) by mouth at bedtime.   venlafaxine XR (EFFEXOR-XR) 150 MG 24 hr capsule Take 1 capsule (150 mg total) by mouth daily with breakfast.    atorvastatin (LIPITOR) 20 MG tablet Take 1 tablet (20 mg total) by mouth daily. (Patient not taking: Reported on 03/02/2021)   No facility-administered encounter medications on file as of 03/02/2021.    Allergies (verified) Lamictal [lamotrigine], Lithium, Phenylephrine, and Sulfa drugs cross reactors   History: Past Medical History:  Diagnosis Date   Anxiety and depression    with PTSD.  Dr. Johnsie Cancel ordered repeat MRI brain 2017 (no acute, no change from prior MR done in 2012) and referred pt to neuro for neuropsych testing but pt did not go.  Repeat MRI 10/2017 mild chronic microvasc isch change --stable compared to prior MRI.   Arthritis    Bilateral carpal tunnel syndrome    Cataracts, bilateral    Chronic renal insufficiency, stage II (mild) 2015   CrCl about 60 ml/min   Colitis    hx of   DDD (degenerative disc disease), lumbar 2016   L1-2 and L5-S1 fairly severe--intramuscular injection of steroid and toradol by Dr. Antonietta Jewel 02/24/14 to calm down the pain   Enterocele 05/2016   Dx'd by Dr. Toney Rakes (GYN); he referred pt back to the MD at [facility] that did her prior bladder sling procedure.  No rectocele.   GERD (gastroesophageal reflux disease)    Goiter    Lobectomy (left) of thyroid for benign nodule.  Right lobe nodules followed by Dr. Chalmers Cater (FNA 2012 benign goiter) with ultrasounds and have been stable, most recently 01/10/13.     H/O adenomatous polyp  of colon 2012; 05/2016   5 yr recall   Hypercalcemia 10/2017   suspected to be due to lithium toxicity.   Hyperlipidemia    Hypertension    Lithium toxicity 2019   Lumbar spondylolysis 2016   L5: with grade I spondylolisthesis L5 on S1   Osteoporosis 2012; 2018   2012-"penia".  2018 "porosis"--alendronate started 12/2016.  Repeat DEXA 12/2018.   PFO (patent foramen ovale) echo 03/21/05   "hole in heart" saw Dr. Sharee Holster [phone] [facility]. hospital.  Plavix med mgmt.  Dr. Johnsie Cancel repeated echo w/bubble study 05/2016 and  it showed NO PFO or ASD.   Subclinical hypothyroidism    Dr. Chalmers Cater has her on 25 mcg synthroid qd as of 01/2014   TIA (transient ischemic attack)    patient on plavix,  saw Dr. Isabell Jarvis at [facility].hospital in [place].  MRI brain 05/2016: mild chronic small vessel dz, o/w normal.   Urinary tract infection    hx of   Vertigo    Past Surgical History:  Procedure Laterality Date   ABDOMINAL HYSTERECTOMY     BLADDER SUSPENSION     BREAST EXCISIONAL BIOPSY Right 1960   BREAST EXCISIONAL BIOPSY Left    BREAST SURGERY     "breast lumps removed"   COLONOSCOPY W/ POLYPECTOMY  04/2011; 05/29/16   +Diverticulosis and int hem.  Adenomatous polyp 2012.  No polyps on repeat TCS 05/2016--recall 5 yrs (05/2021)   DEXA  05/23/2011   2012 T-score -2.2. 12/2016 T-score -2.6   DILATION AND CURETTAGE OF UTERUS     diverticulosis  05/2016   Noted on colonoscopy   RECTOCELE REPAIR     purse string   THYROIDECTOMY     Left lobectomy   TOTAL KNEE ARTHROPLASTY  07/26/2012   Procedure: TOTAL KNEE ARTHROPLASTY;  Surgeon: Yvette Rack., MD;  Location: [place];  Service: Orthopedics;  Laterality: Right;   TRANSTHORACIC ECHOCARDIOGRAM  05/24/2016   EF 60-65%, normal LV function.  No DD. No PFO or ASD (bubble study WAS done).   Family History  Problem Relation Age of Onset   Heart disease Mother    Stomach cancer Father    Cancer Father    Colon polyps Daughter    Diabetes Cousin    Colon cancer Neg Hx    Social History   Socioeconomic History   Marital status: Married    Spouse name: Not on file   Number of children: 4   Years of education: Not on file   Highest education level: Not on file  Occupational History   Occupation: Retired  Tobacco Use   Smoking status: Former   Smokeless tobacco: Never   Tobacco comments:    smoked 1/4 of pack from 1961-1967  Vaping Use   Vaping Use: Never used  Substance and Sexual Activity   Alcohol use: No   Drug use: No   Sexual activity: Not Currently   Other Topics Concern   Not on file  Social History Narrative   Married, 4 children.   Orig from [place], relocated [facility] [place].   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  Retired in her 68s.   Volunteered a lot.   Tob: minimal, quit 1969.   Alc: none   Social Determinants of Radio broadcast assistant Strain: Low Risk    Difficulty of Paying Living Expenses: Not hard at all  Food Insecurity: No Food Insecurity   Worried About Running Out of  Food in the Last Year: Never true   Ran Out of Food in the Last Year: Never true  Transportation Needs: Not on file  Physical Activity: Inactive   Days of Exercise per Week: 0 days   Minutes of Exercise per Session: 0 min  Stress: No Stress Concern Present   Feeling of Stress : Not at all  Social Connections: Moderately Integrated   Frequency of Communication with Friends and Family: More than three times a week   Frequency of Social Gatherings with Friends and Family: Once a week   Attends Religious Services: Never   Marine scientist or Organizations: Yes   Attends Music therapist: 1 to 4 times per year   Marital Status: Married    Tobacco Counseling Counseling given: Not Answered Tobacco comments: smoked 1/4 of pack from 1961-1967   Clinical Intake:  Pre-visit preparation completed: Yes  Pain : No/denies pain     Nutritional Risks: None Diabetes: No  How often do you need to have someone help you when you read instructions, pamphlets, or other written materials from your doctor or pharmacy?: 1 - Never  Diabetic?NO     Information entered by :: Leroy Kennedy LPN   Activities of Daily Living In your present state of health, do you have any difficulty performing the following activities: 03/02/2021  Hearing? N  Vision? N  Difficulty concentrating or making decisions? N  Walking or climbing stairs? N  Dressing or bathing? N  Doing errands, shopping? N  Preparing Food and eating ? N   Using the Toilet? N  In the past six months, have you accidently leaked urine? N  Do you have problems with loss of bowel control? N  Managing your Medications? N  Managing your Finances? N  Housekeeping or managing your Housekeeping? N  Some recent data might be hidden    Patient Care Team: Tammi Sou, MD as PCP - General (Family Medicine) Jacelyn Pi, MD as Consulting Physician (Endocrinology) Laroy Apple, MD as Consulting Physician (Physical Medicine and Rehabilitation) Merian Capron, MD as Consulting Physician (Psychiatry) Ladene Artist, MD as Consulting Physician (Gastroenterology) Earlie Server, MD as Consulting Physician (Orthopedic Surgery) Melchor Amour, MD as Consulting Physician (Ophthalmology) Josue Hector, MD as Consulting Physician (Cardiology) Terrance Mass, MD (Inactive) as Consulting Physician (Gynecology)  Indicate any recent Medical Services you may have received from other than [facility] providers in the past year (date may be approximate).     Assessment:   This is a routine wellness examination for [facility].  Hearing/Vision screen Hearing Screening - Comments:: Has appointment with audiologist Vision Screening - Comments:: Dr Trinna Post Appointment  [phone]  Dietary issues and exercise activities discussed: Current Exercise Habits: The patient does not participate in regular exercise at present, Exercise limited by: None identified   Goals Addressed             This Visit's Progress    Patient Stated       Continue current lifestyle       Depression Screen PHQ 2/9 Scores 03/02/2021 12/08/2020 09/08/2020 08/20/2019 02/24/2019 06/20/2018 01/30/2018  PHQ - 2 Score 0 2 5 4 2 3 3   PHQ- 9 Score - 6 14 9 3 8 12   Some encounter information is confidential and restricted. Go to Review Flowsheets activity to see all data.    Fall Risk Fall Risk  03/02/2021 09/08/2020 07/09/2019 02/24/2019 09/26/2017  Falls in the past year? 0 0 0 0 No  Comment - - Emmi Telephone Survey: data to providers prior to load - -  Number falls in past yr: 0 0 - 0 -  Injury with Fall? 0 0 - 0 -  Risk for fall due to : - - - - -  Follow up Falls evaluation completed;Falls prevention discussed Falls evaluation completed - Falls evaluation completed -    FALL RISK PREVENTION PERTAINING TO THE HOME:  Any stairs in or around the home? Yes  If so, are there any without handrails? Yes  Home free of loose throw rugs in walkways, pet beds, electrical cords, etc? Yes  Adequate lighting in your home to reduce risk of falls? Yes   ASSISTIVE DEVICES UTILIZED TO PREVENT FALLS:  Life alert? No  Use of a cane, walker or w/c? No  Grab bars in the bathroom? No  Shower chair or bench in shower? Yes  Elevated toilet seat or a handicapped toilet? No   TIMED UP AND GO:  Was the test performed? No .    Cognitive Function:  Normal cognitive status assessed by direct observation by this Nurse Health Advisor. No abnormalities found.        Immunizations Immunization History  Administered Date(s) Administered   Fluad Quad(high Dose 65+) 05/29/2019, 06/10/2020   Influenza, High Dose Seasonal PF 06/06/2013, 06/05/2017, 05/31/2018   Influenza,inj,Quad PF,6+ Mos 04/20/2015, 05/17/2016   PFIZER(Purple Top)SARS-COV-2 Vaccination 09/18/2019, 10/13/2019, 06/29/2020   Pneumococcal Conjugate-13 12/24/2013   Pneumococcal Polysaccharide-23 04/20/2015   Tdap 04/20/2015   Zoster, Live 07/04/2011    TDAP status: Up to date  Flu Vaccine status: Up to date  Pneumococcal vaccine status: Due, Education has been provided regarding the importance of this vaccine. Advised may receive this vaccine at local pharmacy or Health Dept. Aware to provide a copy of the vaccination record if obtained from local pharmacy or Health Dept. Verbalized acceptance and understanding.  Covid-19 vaccine status: Information provided on how to obtain vaccines.   Qualifies for Shingles  Vaccine? Yes   Zostavax completed Yes   Shingrix Completed?: No.    Education has been provided regarding the importance of this vaccine. Patient has been advised to call insurance company to determine out of pocket expense if they have not yet received this vaccine. Advised may also receive vaccine at local pharmacy or Health Dept. Verbalized acceptance and understanding.  Screening Tests Health Maintenance  Topic Date Due   Zoster Vaccines- Shingrix (1 of 2) Never done   COVID-19 Vaccine (4 - Booster for Pfizer series) 09/29/2020   INFLUENZA VACCINE  03/14/2021   COLONOSCOPY (Pts 45-17yrs Insurance coverage will need to be confirmed)  05/29/2021   MAMMOGRAM  08/20/2021   TETANUS/TDAP  04/19/2025   DEXA SCAN  Completed   PNA vac Low Risk Adult  Completed   HPV VACCINES  Aged Out   Hepatitis C Screening  Discontinued    Health Maintenance  Health Maintenance Due  Topic Date Due   Zoster Vaccines- Shingrix (1 of 2) Never done   COVID-19 Vaccine (4 - Booster for Pfizer series) 09/29/2020    Colorectal cancer screening: Type of screening: Colonoscopy. Completed  . Repeat every 5 years  Mammogram status: Completed  . Repeat every year  Bone Density status: Ordered  . Pt provided with contact info and advised to call to schedule appt.  Lung Cancer Screening: (Low Dose CT Chest recommended if Age 38-80 years, 30 pack-year currently smoking OR have quit w/in 15years.) does not qualify.   Lung Cancer  Screening Referral: na  Additional Screening:  Hepatitis C Screening: does not qualify;   Vision Screening: Recommended annual ophthalmology exams for early detection of glaucoma and other disorders of the eye. Is the patient up to date with their annual eye exam?  Yes  Who is the provider or what is the name of the office in which the patient attends annual eye exams? Dr. Trinna Post If pt is not established with a provider, would they like to be referred to a provider to establish care?   established .   Dental Screening: Recommended annual dental exams for proper oral hygiene  Community Resource Referral / Chronic Care Management: CRR required this visit?  No   CCM required this visit?  No      Plan:     I have personally reviewed and noted the following in the patient's chart:   Medical and social history Use of alcohol, tobacco or illicit drugs  Current medications and supplements including opioid prescriptions.  Functional ability and status Nutritional status Physical activity Advanced directives List of other physicians Hospitalizations, surgeries, and ER visits in previous 12 months Vitals Screenings to include cognitive, depression, and falls Referrals and appointments  In addition, I have reviewed and discussed with patient certain preventive protocols, quality metrics, and best practice recommendations. A written personalized care plan for preventive services as well as general preventive health recommendations were provided to patient.     Leroy Kennedy, LPN   [id number]/[id number]   Nurse Notes: na

## 2021-03-02 NOTE — Patient Instructions (Signed)
Carla Spencer , Thank you for taking time to come for your Medicare Wellness Visit. I appreciate your ongoing commitment to your health goals. Please review the following plan we discussed and let me know if I can assist you in the future.   Screening recommendations/referrals: Colonoscopy: no longer required Mammogram: up to date Bone Density: Education provided   Recommended yearly ophthalmology/optometry visit for glaucoma screening and checkup Recommended yearly dental visit for hygiene and checkup  Vaccinations: Influenza vaccine: Education provided Pneumococcal vaccine: Education provided Tdap vaccine: up to date Shingles vaccine: education provided    Advanced directives: on file  Conditions/risks identified: NA  Next appointment: 03-17-2021 @ 9:30 Dr. Anitra Lauth   Preventive Care 11 Years and Older, Female Preventive care refers to lifestyle choices and visits with your health care provider that can promote health and wellness. What does preventive care include? A yearly physical exam. This is also called an annual well check. Dental exams once or twice a year. Routine eye exams. Ask your health care provider how often you should have your eyes checked. Personal lifestyle choices, including: Daily care of your teeth and gums. Regular physical activity. Eating a healthy diet. Avoiding tobacco and drug use. Limiting alcohol use. Practicing safe sex. Taking low-dose aspirin every day. Taking vitamin and mineral supplements as recommended by your health care provider. What happens during an annual well check? The services and screenings done by your health care provider during your annual well check will depend on your age, overall health, lifestyle risk factors, and family history of disease. Counseling  Your health care provider may ask you questions about your: Alcohol use. Tobacco use. Drug use. Emotional well-being. Home and relationship well-being. Sexual  activity. Eating habits. History of falls. Memory and ability to understand (cognition). Work and work Statistician. Reproductive health. Screening  You may have the following tests or measurements: Height, weight, and BMI. Blood pressure. Lipid and cholesterol levels. These may be checked every 5 years, or more frequently if you are over 53 years old. Skin check. Lung cancer screening. You may have this screening every year starting at age 57 if you have a 30-pack-year history of smoking and currently smoke or have quit within the past 15 years. Fecal occult blood test (FOBT) of the stool. You may have this test every year starting at age 32. Flexible sigmoidoscopy or colonoscopy. You may have a sigmoidoscopy every 5 years or a colonoscopy every 10 years starting at age 89. Hepatitis C blood test. Hepatitis B blood test. Sexually transmitted disease (STD) testing. Diabetes screening. This is done by checking your blood sugar (glucose) after you have not eaten for a while (fasting). You may have this done every 1-3 years. Bone density scan. This is done to screen for osteoporosis. You may have this done starting at age 71. Mammogram. This may be done every 1-2 years. Talk to your health care provider about how often you should have regular mammograms. Talk with your health care provider about your test results, treatment options, and if necessary, the need for more tests. Vaccines  Your health care provider may recommend certain vaccines, such as: Influenza vaccine. This is recommended every year. Tetanus, diphtheria, and acellular pertussis (Tdap, Td) vaccine. You may need a Td booster every 10 years. Zoster vaccine. You may need this after age 15. Pneumococcal 13-valent conjugate (PCV13) vaccine. One dose is recommended after age 38. Pneumococcal polysaccharide (PPSV23) vaccine. One dose is recommended after age 54. Talk to your health care provider  about which screenings and vaccines  you need and how often you need them. This information is not intended to replace advice given to you by your health care provider. Make sure you discuss any questions you have with your health care provider. Document Released: 08/27/2015 Document Revised: 04/19/2016 Document Reviewed: 06/01/2015 Elsevier Interactive Patient Education  2017 [place] Prevention in the Home Falls can cause injuries. They can happen to people of all ages. There are many things you can do to make your home safe and to help prevent falls. What can I do on the outside of my home? Regularly fix the edges of walkways and driveways and fix any cracks. Remove anything that might make you trip as you walk through a door, such as a raised step or threshold. Trim any bushes or trees on the path to your home. Use bright outdoor lighting. Clear any walking paths of anything that might make someone trip, such as rocks or tools. Regularly check to see if handrails are loose or broken. Make sure that both sides of any steps have handrails. Any raised decks and porches should have guardrails on the edges. Have any leaves, snow, or ice cleared regularly. Use sand or salt on walking paths during winter. Clean up any spills in your garage right away. This includes oil or grease spills. What can I do in the bathroom? Use night lights. Install grab bars by the toilet and in the tub and shower. Do not use towel bars as grab bars. Use non-skid mats or decals in the tub or shower. If you need to sit down in the shower, use a plastic, non-slip stool. Keep the floor dry. Clean up any water that spills on the floor as soon as it happens. Remove soap buildup in the tub or shower regularly. Attach bath mats securely with double-sided non-slip rug tape. Do not have throw rugs and other things on the floor that can make you trip. What can I do in the bedroom? Use night lights. Make sure that you have a light by your bed that  is easy to reach. Do not use any sheets or blankets that are too big for your bed. They should not hang down onto the floor. Have a firm chair that has side arms. You can use this for support while you get dressed. Do not have throw rugs and other things on the floor that can make you trip. What can I do in the kitchen? Clean up any spills right away. Avoid walking on wet floors. Keep items that you use a lot in easy-to-reach places. If you need to reach something above you, use a strong step stool that has a grab bar. Keep electrical cords out of the way. Do not use floor polish or wax that makes floors slippery. If you must use wax, use non-skid floor wax. Do not have throw rugs and other things on the floor that can make you trip. What can I do with my stairs? Do not leave any items on the stairs. Make sure that there are handrails on both sides of the stairs and use them. Fix handrails that are broken or loose. Make sure that handrails are as long as the stairways. Check any carpeting to make sure that it is firmly attached to the stairs. Fix any carpet that is loose or worn. Avoid having throw rugs at the top or bottom of the stairs. If you do have throw rugs, attach them to the floor  with carpet tape. Make sure that you have a light switch at the top of the stairs and the bottom of the stairs. If you do not have them, ask someone to add them for you. What else can I do to help prevent falls? Wear shoes that: Do not have high heels. Have rubber bottoms. Are comfortable and fit you well. Are closed at the toe. Do not wear sandals. If you use a stepladder: Make sure that it is fully opened. Do not climb a closed stepladder. Make sure that both sides of the stepladder are locked into place. Ask someone to hold it for you, if possible. Clearly mark and make sure that you can see: Any grab bars or handrails. First and last steps. Where the edge of each step is. Use tools that help you  move around (mobility aids) if they are needed. These include: Canes. Walkers. Scooters. Crutches. Turn on the lights when you go into a dark area. Replace any light bulbs as soon as they burn out. Set up your furniture so you have a clear path. Avoid moving your furniture around. If any of your floors are uneven, fix them. If there are any pets around you, be aware of where they are. Review your medicines with your doctor. Some medicines can make you feel dizzy. This can increase your chance of falling. Ask your doctor what other things that you can do to help prevent falls. This information is not intended to replace advice given to you by your health care provider. Make sure you discuss any questions you have with your health care provider. Document Released: 05/27/2009 Document Revised: 01/06/2016 Document Reviewed: 09/04/2014 Elsevier Interactive Patient Education  2017 [employer].

## 2021-03-10 DIAGNOSIS — H16223 Keratoconjunctivitis sicca, not specified as Sjogren's, bilateral: Secondary | ICD-10-CM | POA: Diagnosis not present

## 2021-03-10 DIAGNOSIS — H52203 Unspecified astigmatism, bilateral: Secondary | ICD-10-CM | POA: Diagnosis not present

## 2021-03-10 DIAGNOSIS — H524 Presbyopia: Secondary | ICD-10-CM | POA: Diagnosis not present

## 2021-03-10 DIAGNOSIS — D23112 Other benign neoplasm of skin of right lower eyelid, including canthus: Secondary | ICD-10-CM | POA: Diagnosis not present

## 2021-03-10 DIAGNOSIS — H2513 Age-related nuclear cataract, bilateral: Secondary | ICD-10-CM | POA: Diagnosis not present

## 2021-03-10 DIAGNOSIS — D23122 Other benign neoplasm of skin of left lower eyelid, including canthus: Secondary | ICD-10-CM | POA: Diagnosis not present

## 2021-03-10 DIAGNOSIS — H5203 Hypermetropia, bilateral: Secondary | ICD-10-CM | POA: Diagnosis not present

## 2021-03-10 DIAGNOSIS — H43393 Other vitreous opacities, bilateral: Secondary | ICD-10-CM | POA: Diagnosis not present

## 2021-03-16 ENCOUNTER — Other Ambulatory Visit: Payer: Self-pay

## 2021-03-17 ENCOUNTER — Ambulatory Visit (INDEPENDENT_AMBULATORY_CARE_PROVIDER_SITE_OTHER): Payer: Medicare Other | Admitting: Family Medicine

## 2021-03-17 ENCOUNTER — Encounter: Payer: Self-pay | Admitting: Family Medicine

## 2021-03-17 VITALS — BP 111/69 | HR 72 | Temp 97.8°F | Ht 62.0 in | Wt 161.4 lb

## 2021-03-17 DIAGNOSIS — Z1231 Encounter for screening mammogram for malignant neoplasm of breast: Secondary | ICD-10-CM | POA: Diagnosis not present

## 2021-03-17 DIAGNOSIS — Z0000 Encounter for general adult medical examination without abnormal findings: Secondary | ICD-10-CM | POA: Diagnosis not present

## 2021-03-17 DIAGNOSIS — F339 Major depressive disorder, recurrent, unspecified: Secondary | ICD-10-CM

## 2021-03-17 DIAGNOSIS — F4310 Post-traumatic stress disorder, unspecified: Secondary | ICD-10-CM | POA: Diagnosis not present

## 2021-03-17 DIAGNOSIS — I10 Essential (primary) hypertension: Secondary | ICD-10-CM | POA: Diagnosis not present

## 2021-03-17 DIAGNOSIS — F411 Generalized anxiety disorder: Secondary | ICD-10-CM

## 2021-03-17 DIAGNOSIS — E7800 Pure hypercholesterolemia, unspecified: Secondary | ICD-10-CM | POA: Diagnosis not present

## 2021-03-17 DIAGNOSIS — E039 Hypothyroidism, unspecified: Secondary | ICD-10-CM

## 2021-03-17 LAB — LIPID PANEL
Cholesterol: 155 mg/dL (ref 0–200)
HDL: 53.3 mg/dL (ref >39.00)
LDL Cholesterol: 79 mg/dL (ref 0–99)
NonHDL: 101.68
Total CHOL/HDL Ratio: 3
Triglycerides: 114 mg/dL (ref 0.0–149.0)
VLDL: 22.8 mg/dL (ref 0.0–40.0)

## 2021-03-17 LAB — CBC WITH DIFFERENTIAL/PLATELET
Basophils Absolute: 0 10*3/uL (ref 0.0–0.1)
Basophils Relative: 0.7 % (ref 0.0–3.0)
Eosinophils Absolute: 0.2 10*3/uL (ref 0.0–0.7)
Eosinophils Relative: 3.6 % (ref 0.0–5.0)
HCT: 42.2 % (ref 36.0–46.0)
Hemoglobin: 13.8 g/dL (ref 12.0–15.0)
Lymphocytes Relative: 24.2 % (ref 12.0–46.0)
Lymphs Abs: 1.5 10*3/uL (ref 0.7–4.0)
MCHC: 32.7 g/dL (ref 30.0–36.0)
MCV: 89.8 fl (ref 78.0–100.0)
Monocytes Absolute: 0.4 10*3/uL (ref 0.1–1.0)
Monocytes Relative: 6.9 % (ref 3.0–12.0)
Neutro Abs: 4.1 10*3/uL (ref 1.4–7.7)
Neutrophils Relative %: 64.6 % (ref 43.0–77.0)
Platelets: 207 10*3/uL (ref 150.0–400.0)
RBC: 4.7 Mil/uL (ref 3.87–5.11)
RDW: 13.7 % (ref 11.5–15.5)
WBC: 6.3 10*3/uL (ref 4.0–10.5)

## 2021-03-17 LAB — COMPREHENSIVE METABOLIC PANEL
ALT: 15 U/L (ref 0–35)
AST: 15 U/L (ref 0–37)
Albumin: 4.2 g/dL (ref 3.5–5.2)
Alkaline Phosphatase: 88 U/L (ref 39–117)
BUN: 16 mg/dL (ref 6–23)
CO2: 28 mEq/L (ref 19–32)
Calcium: 10 mg/dL (ref 8.4–10.5)
Chloride: 105 mEq/L (ref 96–112)
Creatinine, Ser: 1.03 mg/dL (ref 0.40–1.20)
GFR: 52.39 mL/min — ABNORMAL LOW (ref >60.00)
Glucose, Bld: 80 mg/dL (ref 70–99)
Potassium: 4.6 mEq/L (ref 3.5–5.1)
Sodium: 141 mEq/L (ref 135–145)
Total Bilirubin: 0.7 mg/dL (ref 0.2–1.2)
Total Protein: 6.3 g/dL (ref 6.0–8.3)

## 2021-03-17 LAB — TSH: TSH: 2.55 u[IU]/mL (ref 0.35–5.50)

## 2021-03-17 MED ORDER — ZOSTER VAC RECOMB ADJUVANTED 50 MCG/0.5ML IM SUSR: 0.5000 mL | Freq: Once | INTRAMUSCULAR | 1 refills | Status: AC

## 2021-03-17 MED ORDER — RISPERIDONE 0.5 MG PO TBDP: 0.5000 mg | ORAL_TABLET | Freq: Every day | ORAL | 1 refills | Status: DC

## 2021-03-17 MED ORDER — LEVOTHYROXINE SODIUM 50 MCG PO TABS: 50.0000 ug | ORAL_TABLET | Freq: Every day | ORAL | 3 refills | Status: DC

## 2021-03-17 NOTE — Progress Notes (Signed)
Office Note 03/17/2021  CC:  Chief Complaint  Patient presents with   Annual Exam    Pt is fasting   Follow-up    HTN   HPI:  Carla Spencer is a 80 y.o. White female who is here for annual health maintenance exam and for f/u HTN, HLD, and CRI II/III. She has signif hx of treatment resistant MDD, GAD, PTSD. A/P as of last visit 3 mo ago: "Recurrent MDD/possible bipolar II disorder, PTSD, GAD, insomnia.  Has hx of treatment resistance, at least partially due to her tendency to not want to take meds in general. She is doing significantly better last few months since getting on low dose risperdal. Has had some wt gain but I'm not convinced it can all be attributed to the low dose of risperdal she is on.  Discussed diet changes she could try: limit portion sizes, avoid high fat/fried foods, avoid excessive carb-laden foods, eat healthy snacks. Encouraged exercise. NO MED CHANGES TODAY.  I'll take clonazepam off her med list since she's been doing ok OFF this med, but if she starts getting probs with excessive daytime anxiety or panic again then we can restart it low dose."  INTERIM HX: Doing ok overall. Sleeping better.  Says crying abruptly is still an issue.   She doesn't think this part is better.  Bad dreams.  Social situations very difficult for her.No SI or HI. She doesn't want to change meds.  HTN: occ home bp check, normal.  HLD: taking atorva 20 qd.  CRI:  avoids NSAIDs.   Mood/anxiety:  She is considering stopping f/u with Dr. Kipp Brood, says her interactions with her have not been very helpful/positive.   Past Medical History:  Diagnosis Date   Anxiety and depression    with PTSD.  Dr. Johnsie Cancel ordered repeat MRI brain 2017 (no acute, no change from prior MR done in 2012) and referred pt to neuro for neuropsych testing but pt did not go.  Repeat MRI 10/2017 mild chronic microvasc isch change --stable compared to prior MRI.   Arthritis    Bilateral carpal tunnel  syndrome    Cataracts, bilateral    Chronic renal insufficiency, stage II (mild) 2015   CrCl about 60 ml/min   Colitis    hx of   DDD (degenerative disc disease), lumbar 2016   L1-2 and L5-S1 fairly severe--intramuscular injection of steroid and toradol by Dr. Antonietta Jewel 02/24/14 to calm down the pain   Enterocele 05/2016   Dx'd by Dr. Toney Rakes (GYN); he referred pt back to the MD at [facility] that did her prior bladder sling procedure.  No rectocele.   GERD (gastroesophageal reflux disease)    Goiter    Lobectomy (left) of thyroid for benign nodule.  Right lobe nodules followed by Dr. Chalmers Cater (FNA 2012 benign goiter) with ultrasounds and have been stable, most recently 01/10/13.     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   Hyperlipidemia    Hypertension    Lithium toxicity 2019   Lumbar spondylolysis 2016   L5: with grade I spondylolisthesis L5 on S1   Osteoporosis 2012; 2018   2012-"penia".  2018 "porosis"--alendronate started 12/2016.  Repeat DEXA 12/2018.   PFO (patent foramen ovale) echo 03/21/05   "hole in heart" saw Dr. Sharee Holster [phone] [facility]. hospital.  Plavix med mgmt.  Dr. Johnsie Cancel repeated echo w/bubble study 05/2016 and  it showed NO PFO or ASD.   Subclinical hypothyroidism    Dr. Chalmers Cater has her on 25 mcg synthroid qd as of 01/2014   TIA (transient ischemic attack)    patient on plavix,  saw Dr. Isabell Jarvis at [facility].hospital in [place].  MRI brain 05/2016: mild chronic small vessel dz, o/w normal.   Urinary tract infection    hx of   Vertigo     Past Surgical History:  Procedure Laterality Date   ABDOMINAL HYSTERECTOMY     BLADDER SUSPENSION     BREAST EXCISIONAL BIOPSY Right 1960   BREAST EXCISIONAL BIOPSY Left    BREAST SURGERY     "breast lumps removed"   COLONOSCOPY W/ POLYPECTOMY  04/2011; 05/29/16   +Diverticulosis and int hem.  Adenomatous polyp 2012.  No polyps on  repeat TCS 05/2016--recall 5 yrs (05/2021)   DEXA  05/23/2011   2012 T-score -2.2. 12/2016 T-score -2.6   DILATION AND CURETTAGE OF UTERUS     diverticulosis  05/2016   Noted on colonoscopy   RECTOCELE REPAIR     purse string   THYROIDECTOMY     Left lobectomy   TOTAL KNEE ARTHROPLASTY  07/26/2012   Procedure: TOTAL KNEE ARTHROPLASTY;  Surgeon: Yvette Rack., MD;  Location: [facility];  Service: Orthopedics;  Laterality: Right;   TRANSTHORACIC ECHOCARDIOGRAM  05/24/2016   EF 60-65%, normal LV function.  No DD. No PFO or ASD (bubble study WAS done).    Family History  Problem Relation Age of Onset   Heart disease Mother    Stomach cancer Father    Cancer Father    Colon polyps Daughter    Diabetes Cousin    Colon cancer Neg Hx     Social History   Socioeconomic History   Marital status: Married    Spouse name: Not on file   Number of children: 4   Years of education: Not on file   Highest education level: Not on file  Occupational History   Occupation: Retired  Tobacco Use   Smoking status: Former   Smokeless tobacco: Never   Tobacco comments:    smoked 1/4 of pack from 1961-1967  Vaping Use   Vaping Use: Never used  Substance and Sexual Activity   Alcohol use: No   Drug use: No   Sexual activity: Not Currently  Other Topics Concern   Not on file  Social History Narrative   Married, 4 children.   Orig from [place], relocated [facility] 2012.   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  Retired in her 47s.   Volunteered a lot.   Tob: minimal, quit 1969.   Alc: none   Social Determinants of Radio broadcast assistant Strain: Low Risk    Difficulty of Paying Living Expenses: Not hard at all  Food Insecurity: No Food Insecurity   Worried About Charity fundraiser in the Last Year: Never true   Arboriculturist in the Last Year: Never true  Transportation Needs: No Transportation Needs   Lack of Transportation (Medical): No   Lack of Transportation  (Non-Medical): No  Physical Activity: Inactive   Days of Exercise per Week: 0 days   Minutes of Exercise per Session: 0 min  Stress: No Stress Concern Present   Feeling of Stress : Not at all  Social Connections: Moderately Integrated   Frequency of Communication with Friends and Family: More than three times a week  Frequency of Social Gatherings with Friends and Family: Once a week   Attends Religious Services: Never   Marine scientist or Organizations: Yes   Attends Music therapist: 1 to 4 times per year   Marital Status: Married  Human resources officer Violence: Not At Risk   Fear of Current or Ex-Partner: No   Emotionally Abused: No   Physically Abused: No   Sexually Abused: No    Outpatient Medications Prior to Visit  Medication Sig Dispense Refill   amLODipine (NORVASC) 10 MG tablet Take 1 tablet by mouth once daily 90 tablet 0   aspirin EC 81 MG tablet Take 1 tablet (81 mg total) by mouth daily. 30 tablet 0   busPIRone (BUSPAR) 10 MG tablet 1 tab po tid 90 tablet 3   losartan (COZAAR) 100 MG tablet Take 1 tablet (100 mg total) by mouth daily. 90 tablet 3   Multiple Vitamin (MV-ONE) CAPS Take 1 capsule by mouth daily.     Omega-3 Fatty Acids (FISH OIL) 500 MG CAPS Take 1 capsule by mouth daily.     venlafaxine XR (EFFEXOR-XR) 150 MG 24 hr capsule Take 1 capsule (150 mg total) by mouth daily with breakfast. 90 capsule 3   atorvastatin (LIPITOR) 20 MG tablet Take 1 tablet (20 mg total) by mouth daily. (Patient not taking: No sig reported) 90 tablet 3   levothyroxine (SYNTHROID, LEVOTHROID) 50 MCG tablet Take 1 tablet (50 mcg total) by mouth daily before breakfast. 30 tablet 6   risperiDONE (RISPERDAL M-TABS) 0.5 MG disintegrating tablet TAKE 1 TABLET BY MOUTH AT BEDTIME 30 tablet 0   No facility-administered medications prior to visit.    Allergies  Allergen Reactions   Lamictal [Lamotrigine] Other (See Comments)   Lithium Other (See Comments)    toxicity    Phenylephrine Rash    Patient had allergic reaction to dilation combo drop. Please dilate with Tropicamide 0.5% only with Fluress and/or Proparacaine    Sulfa Drugs Cross Reactors Rash    ROS Review of Systems  Constitutional:  Negative for appetite change, chills, fatigue and fever.  HENT:  Negative for congestion, dental problem, ear pain and sore throat.   Eyes:  Negative for discharge, redness and visual disturbance.  Respiratory:  Negative for cough, chest tightness, shortness of breath and wheezing.   Cardiovascular:  Negative for chest pain, palpitations and leg swelling.  Gastrointestinal:  Negative for abdominal pain, blood in stool, diarrhea, nausea and vomiting.  Genitourinary:  Negative for difficulty urinating, dysuria, flank pain, frequency, hematuria and urgency.  Musculoskeletal:  Negative for arthralgias, back pain, joint swelling, myalgias and neck stiffness.  Skin:  Negative for pallor and rash.  Neurological:  Negative for dizziness, speech difficulty, weakness and headaches.  Hematological:  Negative for adenopathy. Does not bruise/bleed easily.  Psychiatric/Behavioral:  Positive for decreased concentration and dysphoric mood. Negative for confusion, hallucinations, self-injury, sleep disturbance and suicidal ideas. The patient is not nervous/anxious.    PE; Vitals with BMI 03/17/2021 03/02/2021 12/08/2020  Height '5\' 2"'$  (No Data) '5\' 1"'$   Weight 161 lbs 6 oz (No Data) 164 lbs 10 oz  BMI [id number] - 99991111  Systolic 99991111 (No Data) [id number]  Diastolic 69 (No Data) 75  Pulse 72 - 76  Some encounter information is confidential and restricted. Go to Review Flowsheets activity to see all data.   Exam chaperoned by Shepard General, CMA Gen: Alert, well appearing.  Patient is oriented to person, place, time, and situation. AFFECT: pleasant,  lucid thought and speech. ENT: Ears: EACs clear, normal epithelium.  TMs with good light reflex and landmarks bilaterally.  Eyes: no injection,  icteris, swelling, or exudate.  EOMI, PERRLA. Nose: no drainage or turbinate edema/swelling.  No injection or focal lesion.  Mouth: lips without lesion/swelling.  Oral mucosa pink and moist.  Dentition intact and without obvious caries or gingival swelling.  Oropharynx without erythema, exudate, or swelling.  Neck: supple/nontender.  No LAD, mass, or TM.  Carotid pulses 2+ bilaterally, without bruits. CV: RRR, no m/r/g.   LUNGS: CTA bilat, nonlabored resps, good aeration in all lung fields. ABD: soft, NT, ND, BS normal.  No hepatospenomegaly or mass.  No bruits. EXT: no clubbing, cyanosis, or pitting edema. She has some small varicosities and some spider veins around both ankles.  No erythema or tenderness. Musculoskeletal: no joint swelling, erythema, warmth, or tenderness.  ROM of all joints intact. Skin - no sores or suspicious lesions or rashes or color changes  Pertinent labs:  Lab Results  Component Value Date   TSH 2.08 10/22/2017   Lab Results  Component Value Date   WBC 7.5 09/08/2020   HGB 14.3 09/08/2020   HCT 43.2 09/08/2020   MCV 89.7 09/08/2020   PLT 235.0 09/08/2020   Lab Results  Component Value Date   CREATININE 1.06 09/08/2020   BUN 21 09/08/2020   NA 140 09/08/2020   K 4.5 09/08/2020   CL 104 09/08/2020   CO2 27 09/08/2020   Lab Results  Component Value Date   ALT 15 09/08/2020   AST 14 09/08/2020   ALKPHOS 94 09/08/2020   BILITOT 0.7 09/08/2020   Lab Results  Component Value Date   CHOL 153 09/08/2020   Lab Results  Component Value Date   HDL 60.40 09/08/2020   Lab Results  Component Value Date   LDLCALC 69 09/08/2020   Lab Results  Component Value Date   TRIG 121.0 09/08/2020   Lab Results  Component Value Date   CHOLHDL 3 09/08/2020   ASSESSMENT AND PLAN:   1) HTN: well controlled.  Cont amlod 10 qd and losartan 100 qd. Lytes/cr today.  2) HLD: tolerating atorva 20 qd. FLP and hepatic panel today.  3) Hypothyroidism: pt requests  that I assume responsibility for managing this now. TSH today.  Cont T4 50 mcg qd at this time.  4) Recurrent/treatment-resistant MDD, PTSD, GAD: She is a little better.  Cont risperdal 0.'5mg'$  qhs, effexor xr 150 qd, and buspar '10mg'$  tid. She does not want to change med regimen or dosing at this time and I agree with this decision.  5) CRI II/III: avoids nsaids.  Good hydration habits encouraged. Lytes/cr today.  6) Health maintenance exam: Reviewed age and gender appropriate health maintenance issues (prudent diet, regular exercise, health risks of tobacco and excessive alcohol, use of seatbelts, fire alarms in home, use of sunscreen).  Also reviewed age and gender appropriate health screening as well as vaccine recommendations. Vaccines: Shingrix->rx to pharmacy.  Otherwise ALL UTD. Labs: fasting HP labs ordered. Cervical ca screening: n/a-->hx of hysterectomy for nonmalignant dx. Breast ca screening: Next mammogram due 08/2021. Colon ca screening:  remote hx of adenoma-->recall 05/2021. Osteoporosis screening: DEXA scheduled for 08/2021.  An After Visit Summary was printed and given to the patient.  FOLLOW UP:  Return in about 3 months (around 06/17/2021) for routine chronic illness f/u.  Signed:  Crissie Sickles, MD           03/17/2021

## 2021-05-10 ENCOUNTER — Other Ambulatory Visit: Payer: Self-pay | Admitting: Family Medicine

## 2021-06-16 DIAGNOSIS — B372 Candidiasis of skin and nail: Secondary | ICD-10-CM | POA: Diagnosis not present

## 2021-06-16 DIAGNOSIS — L218 Other seborrheic dermatitis: Secondary | ICD-10-CM | POA: Diagnosis not present

## 2021-06-16 DIAGNOSIS — L738 Other specified follicular disorders: Secondary | ICD-10-CM | POA: Diagnosis not present

## 2021-06-16 DIAGNOSIS — D1801 Hemangioma of skin and subcutaneous tissue: Secondary | ICD-10-CM | POA: Diagnosis not present

## 2021-06-16 DIAGNOSIS — L821 Other seborrheic keratosis: Secondary | ICD-10-CM | POA: Diagnosis not present

## 2021-06-16 DIAGNOSIS — L918 Other hypertrophic disorders of the skin: Secondary | ICD-10-CM | POA: Diagnosis not present

## 2021-06-21 ENCOUNTER — Encounter: Payer: Self-pay | Admitting: Family Medicine

## 2021-06-21 ENCOUNTER — Other Ambulatory Visit: Payer: Self-pay

## 2021-06-21 ENCOUNTER — Ambulatory Visit (INDEPENDENT_AMBULATORY_CARE_PROVIDER_SITE_OTHER): Payer: Medicare Other | Admitting: Family Medicine

## 2021-06-21 VITALS — BP 105/67 | HR 75 | Temp 97.8°F | Resp 16 | Ht 62.0 in | Wt 156.8 lb

## 2021-06-21 DIAGNOSIS — I10 Essential (primary) hypertension: Secondary | ICD-10-CM | POA: Diagnosis not present

## 2021-06-21 DIAGNOSIS — E7800 Pure hypercholesterolemia, unspecified: Secondary | ICD-10-CM

## 2021-06-21 DIAGNOSIS — F411 Generalized anxiety disorder: Secondary | ICD-10-CM

## 2021-06-21 DIAGNOSIS — F329 Major depressive disorder, single episode, unspecified: Secondary | ICD-10-CM

## 2021-06-21 DIAGNOSIS — Z23 Encounter for immunization: Secondary | ICD-10-CM | POA: Diagnosis not present

## 2021-06-21 DIAGNOSIS — N182 Chronic kidney disease, stage 2 (mild): Secondary | ICD-10-CM | POA: Diagnosis not present

## 2021-06-21 DIAGNOSIS — E039 Hypothyroidism, unspecified: Secondary | ICD-10-CM

## 2021-06-21 LAB — BASIC METABOLIC PANEL
BUN: 17 mg/dL (ref 6–23)
CO2: 28 mEq/L (ref 19–32)
Calcium: 10 mg/dL (ref 8.4–10.5)
Chloride: 104 mEq/L (ref 96–112)
Creatinine, Ser: 0.99 mg/dL (ref 0.40–1.20)
GFR: 54.84 mL/min — ABNORMAL LOW (ref >60.00)
Glucose, Bld: 79 mg/dL (ref 70–99)
Potassium: 4.3 mEq/L (ref 3.5–5.1)
Sodium: 141 mEq/L (ref 135–145)

## 2021-06-21 MED ORDER — AMLODIPINE BESYLATE 10 MG PO TABS: 10.0000 mg | ORAL_TABLET | Freq: Every day | ORAL | 3 refills | Status: DC

## 2021-06-21 NOTE — Progress Notes (Signed)
OFFICE VISIT  06/21/2021  CC:  Chief Complaint  Patient presents with   Follow-up    RCI, pt is fasting.     HPI:    Patient is a 80 y.o. female who presents for 3 mo f/u HTN, HLD, hypothyroidism, and CRI II/III. Her health is most affected by her chronic treatment resistant depression, GAD, and PTSD. A/P as of last visit: "1) HTN: well controlled.  Cont amlod 10 qd and losartan 100 qd. Lytes/cr today.   2) HLD: tolerating atorva 20 qd. FLP and hepatic panel today.   3) Hypothyroidism: pt requests that I assume responsibility for managing this now. TSH today.  Cont T4 50 mcg qd at this time.   4) Recurrent/treatment-resistant MDD, PTSD, GAD: She is a little better.  Cont risperdal 0.5mg  qhs, effexor xr 150 qd, and buspar 10mg  tid. She does not want to change med regimen or dosing at this time and I agree with this decision.   5) CRI II/III: avoids nsaids.  Good hydration habits encouraged. Lytes/cr today.   6) Health maintenance exam: Reviewed age and gender appropriate health maintenance issues (prudent diet, regular exercise, health risks of tobacco and excessive alcohol, use of seatbelts, fire alarms in home, use of sunscreen).  Also reviewed age and gender appropriate health screening as well as vaccine recommendations. Vaccines: Shingrix->rx to pharmacy.  Otherwise ALL UTD. Labs: fasting HP labs ordered. Cervical ca screening: n/a-->hx of hysterectomy for nonmalignant dx. Breast ca screening: Next mammogram due 08/2021. Colon ca screening:  remote hx of adenoma-->recall 05/2021. Osteoporosis screening: DEXA scheduled for 08/2021."  INTERIM HX: Carla Spencer is doing about the same.  She rates her depression is about a 5 out of 10.  Biggest issues are ruminating over sudden bouts of sadness and crying that she has.  Sleep has been fair.  When she wakes up she says her mind is flooded again with all her anxieties.  Denies SI and HI.  Appetite is good.  She admits she is not good at  getting out and being active.  No home blood pressure monitoring. She hydrates fairly well, avoids nonsteroidals. Compliant with statin, diet is fair.  Hypoth: Takes T4 on empty stomach w/out any other meds.  ROS as above, plus--> no fevers, no CP, no SOB, no wheezing, no cough, no dizziness, no HAs, no rashes, no melena/hematochezia.  No polyuria or polydipsia.  No myalgias or arthralgias.  No focal weakness, paresthesias, or tremors.  No acute vision or hearing abnormalities.  No dysuria or unusual/new urinary urgency or frequency.  No recent changes in lower legs. No n/v/d or abd pain.  No palpitations.    Past Medical History:  Diagnosis Date   Anxiety and depression    with PTSD.  Dr. Johnsie Cancel ordered repeat MRI brain 2017 (no acute, no change from prior MR done in 2012) and referred pt to neuro for neuropsych testing but pt did not go.  Repeat MRI 10/2017 mild chronic microvasc isch change --stable compared to prior MRI.   Arthritis    Bilateral carpal tunnel syndrome    Cataracts, bilateral    Chronic renal insufficiency, stage II (mild) 2015   CrCl about 60 ml/min   Colitis    hx of   DDD (degenerative disc disease), lumbar 2016   L1-2 and L5-S1 fairly severe--intramuscular injection of steroid and toradol by Dr. Antonietta Jewel 02/24/14 to calm down the pain   Enterocele 05/2016   Dx'd by Dr. Toney Rakes (GYN); he referred pt back to  the MD at [facility] that did her prior bladder sling procedure.  No rectocele.   GERD (gastroesophageal reflux disease)    Goiter    Lobectomy (left) of thyroid for benign nodule.  Right lobe nodules followed by Dr. Chalmers Cater (FNA 2012 benign goiter) with ultrasounds and have been stable, most recently 01/10/13.     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   Hyperlipidemia    Hypertension    Lithium toxicity 2019   Lumbar spondylolysis 2016   L5: with grade I spondylolisthesis L5  on S1   Osteoporosis 2012; 2018   2012-"penia".  2018 "porosis"--alendronate started 12/2016.  Repeat DEXA 12/2018.   PFO (patent foramen ovale) echo 03/21/05   "hole in heart" saw Dr. Sharee Holster [phone] [facility]. hospital.  Plavix med mgmt.  Dr. Johnsie Cancel repeated echo w/bubble study 05/2016 and it showed NO PFO or ASD.   Subclinical hypothyroidism    Dr. Chalmers Cater has her on 25 mcg synthroid qd as of 01/2014   TIA (transient ischemic attack)    patient on plavix,  saw Dr. Isabell Jarvis at [facility].hospital in [place].  MRI brain 05/2016: mild chronic small vessel dz, o/w normal.   Urinary tract infection    hx of   Vertigo     Past Surgical History:  Procedure Laterality Date   ABDOMINAL HYSTERECTOMY     BLADDER SUSPENSION     BREAST EXCISIONAL BIOPSY Right 1960   BREAST EXCISIONAL BIOPSY Left    BREAST SURGERY     "breast lumps removed"   COLONOSCOPY W/ POLYPECTOMY  04/2011; 05/29/16   +Diverticulosis and int hem.  Adenomatous polyp 2012.  No polyps on repeat TCS 05/2016--recall 5 yrs (05/2021)   DEXA  05/23/2011   2012 T-score -2.2. 12/2016 T-score -2.6   DILATION AND CURETTAGE OF UTERUS     diverticulosis  05/2016   Noted on colonoscopy   RECTOCELE REPAIR     purse string   THYROIDECTOMY     Left lobectomy   TOTAL KNEE ARTHROPLASTY  07/26/2012   Procedure: TOTAL KNEE ARTHROPLASTY;  Surgeon: Yvette Rack., MD;  Location: [facility];  Service: Orthopedics;  Laterality: Right;   TRANSTHORACIC ECHOCARDIOGRAM  05/24/2016   EF 60-65%, normal LV function.  No DD. No PFO or ASD (bubble study WAS done).    Outpatient Medications Prior to Visit  Medication Sig Dispense Refill   aspirin EC 81 MG tablet Take 1 tablet (81 mg total) by mouth daily. 30 tablet 0   atorvastatin (LIPITOR) 20 MG tablet Take 1 tablet (20 mg total) by mouth daily. 90 tablet 3   busPIRone (BUSPAR) 10 MG tablet TAKE 1 TABLET BY MOUTH THREE TIMES DAILY 90 tablet 1   levothyroxine (SYNTHROID) 50 MCG tablet Take 1  tablet (50 mcg total) by mouth daily before breakfast. 90 tablet 3   losartan (COZAAR) 100 MG tablet Take 1 tablet (100 mg total) by mouth daily. 90 tablet 3   Multiple Vitamin (MV-ONE) CAPS Take 1 capsule by mouth daily.     Omega-3 Fatty Acids (FISH OIL) 500 MG CAPS Take 1 capsule by mouth daily.     risperiDONE (RISPERDAL M-TABS) 0.5 MG disintegrating tablet Take 1 tablet (0.5 mg total) by mouth at bedtime. 90 tablet 1   venlafaxine XR (EFFEXOR-XR) 150 MG 24 hr capsule Take 1 capsule (150 mg total) by mouth daily with breakfast.  90 capsule 3   ketoconazole (NIZORAL) 2 % cream Apply topically. (Patient not taking: Reported on 06/21/2021)     amLODipine (NORVASC) 10 MG tablet Take 1 tablet by mouth once daily 90 tablet 0   No facility-administered medications prior to visit.    Allergies  Allergen Reactions   Lamictal [Lamotrigine] Other (See Comments)   Lithium Other (See Comments)    toxicity   Phenylephrine Rash    Patient had allergic reaction to dilation combo drop. Please dilate with Tropicamide 0.5% only with Fluress and/or Proparacaine    Sulfa Drugs Cross Reactors Rash    ROS As per HPI  PE: Vitals with BMI 06/21/2021 03/17/2021 03/02/2021  Height 5\' 2"  5\' 2"  (No Data)  Weight 156 lbs 13 oz 161 lbs 6 oz (No Data)  BMI 30.16 01.09 -  Systolic 323 557 (No Data)  Diastolic 67 69 (No Data)  Pulse 75 72 -  Some encounter information is confidential and restricted. Go to Review Flowsheets activity to see all data.    Gen: Alert, well appearing.  Patient is oriented to person, place, time, and situation. AFFECT: pleasant, lucid thought and speech.  Some anxious moments and brief moments of fighting back tears. CV: RRR, no m/r/g.   LUNGS: CTA bilat, nonlabored resps, good aeration in all lung fields. EXT: no clubbing or cyanosis.  no edema.     LABS:  Lab Results  Component Value Date   TSH 2.55 03/17/2021   Lab Results  Component Value Date   WBC 6.3 03/17/2021   HGB  13.8 03/17/2021   HCT 42.2 03/17/2021   MCV 89.8 03/17/2021   PLT 207.0 03/17/2021   Lab Results  Component Value Date   CREATININE 1.03 03/17/2021   BUN 16 03/17/2021   NA 141 03/17/2021   K 4.6 03/17/2021   CL 105 03/17/2021   CO2 28 03/17/2021   Lab Results  Component Value Date   ALT 15 03/17/2021   AST 15 03/17/2021   ALKPHOS 88 03/17/2021   BILITOT 0.7 03/17/2021   Lab Results  Component Value Date   CHOL 155 03/17/2021   Lab Results  Component Value Date   HDL 53.30 03/17/2021   Lab Results  Component Value Date   LDLCALC 79 03/17/2021   Lab Results  Component Value Date   TRIG 114.0 03/17/2021   Lab Results  Component Value Date   CHOLHDL 3 03/17/2021    IMPRESSION AND PLAN:  #1: Treatment resistant depression, chronic anxiety, PTSD.  Stable but for the long-term she has been only in partial remission.  Discussed possibly increasing BuSpar and or venlafaxine today but she is not comfortable with this at this point.  We will keep these the same and continue Risperdal 0.5 mg at bedtime as well.  Discussed TMS therapy some today and she wanted to look over the info/pamphlet about it and consider it in the future.    #2: Hypertension.  Well-controlled on amlodipine 10 mg a day and losartan 100 mg a day.  Checking electrolytes and creatinine today.  3.  Hyperlipidemia.  Tolerating atorvastatin 20 mg/day.  LDL 79 3 three months ago. Plan repeat lipids 3 months.  #4 chronic renal insufficiency II/III (GFR 60): He avoids nonsteroidals.  Emphasized importance of good hydration.  Checking electrolytes and creatinine today.  5.  Hypothyroidism: TSH 2.5 three months ago. I agreed in the recent past to assume responsibility for monitoring/managing this problem-she had formerly been seeing Dr. Chalmers Cater.  An  After Visit Summary was printed and given to the patient.  FOLLOW UP: Return in about 3 months (around 09/21/2021) for routine chronic illness f/u. Next cpe  03/2022  Signed:  Crissie Sickles, MD           06/21/2021

## 2021-08-26 ENCOUNTER — Other Ambulatory Visit: Payer: Self-pay | Admitting: Family Medicine

## 2021-08-26 DIAGNOSIS — Z1231 Encounter for screening mammogram for malignant neoplasm of breast: Secondary | ICD-10-CM

## 2021-09-05 ENCOUNTER — Ambulatory Visit
Admission: RE | Admit: 2021-09-05 | Discharge: 2021-09-05 | Disposition: A | Discharge status: Home / Self Care | Payer: Medicare Other | Source: Ambulatory Visit | Attending: Family Medicine | Admitting: Family Medicine

## 2021-09-05 DIAGNOSIS — Z780 Asymptomatic menopausal state: Secondary | ICD-10-CM | POA: Diagnosis not present

## 2021-09-05 DIAGNOSIS — M810 Age-related osteoporosis without current pathological fracture: Secondary | ICD-10-CM | POA: Diagnosis not present

## 2021-09-06 ENCOUNTER — Telehealth: Payer: Self-pay | Admitting: Family Medicine

## 2021-09-06 ENCOUNTER — Encounter: Payer: Self-pay | Admitting: Family Medicine

## 2021-09-06 NOTE — Telephone Encounter (Signed)
Spoke with pt regarding results/recommendations,voiced understanding. ? ?

## 2021-09-06 NOTE — Telephone Encounter (Signed)
Pt returning call for bone density results. Best call back # [phone].

## 2021-09-13 ENCOUNTER — Ambulatory Visit
Admission: RE | Admit: 2021-09-13 | Discharge: 2021-09-13 | Disposition: A | Discharge status: Home / Self Care | Payer: Medicare Other | Source: Ambulatory Visit | Attending: Family Medicine | Admitting: Family Medicine

## 2021-09-13 DIAGNOSIS — Z1231 Encounter for screening mammogram for malignant neoplasm of breast: Secondary | ICD-10-CM

## 2021-09-16 DIAGNOSIS — E890 Postprocedural hypothyroidism: Secondary | ICD-10-CM | POA: Diagnosis not present

## 2021-09-16 DIAGNOSIS — M810 Age-related osteoporosis without current pathological fracture: Secondary | ICD-10-CM | POA: Diagnosis not present

## 2021-09-16 DIAGNOSIS — I10 Essential (primary) hypertension: Secondary | ICD-10-CM | POA: Diagnosis not present

## 2021-09-21 ENCOUNTER — Ambulatory Visit: Payer: BLUE CROSS/BLUE SHIELD | Admitting: Family Medicine

## 2021-10-04 ENCOUNTER — Other Ambulatory Visit: Payer: Self-pay | Admitting: Family Medicine

## 2021-10-17 ENCOUNTER — Other Ambulatory Visit: Payer: Self-pay | Admitting: Family Medicine

## 2021-11-23 DIAGNOSIS — E559 Vitamin D deficiency, unspecified: Secondary | ICD-10-CM | POA: Diagnosis not present

## 2021-11-29 ENCOUNTER — Encounter: Payer: Self-pay | Admitting: Family Medicine

## 2021-11-29 ENCOUNTER — Ambulatory Visit (INDEPENDENT_AMBULATORY_CARE_PROVIDER_SITE_OTHER): Payer: Medicare Other | Admitting: Family Medicine

## 2021-11-29 VITALS — BP 112/71 | HR 76 | Temp 98.1°F | Ht 62.0 in | Wt 155.4 lb

## 2021-11-29 DIAGNOSIS — E039 Hypothyroidism, unspecified: Secondary | ICD-10-CM

## 2021-11-29 DIAGNOSIS — F411 Generalized anxiety disorder: Secondary | ICD-10-CM | POA: Diagnosis not present

## 2021-11-29 DIAGNOSIS — I10 Essential (primary) hypertension: Secondary | ICD-10-CM | POA: Diagnosis not present

## 2021-11-29 DIAGNOSIS — E7800 Pure hypercholesterolemia, unspecified: Secondary | ICD-10-CM | POA: Diagnosis not present

## 2021-11-29 DIAGNOSIS — F339 Major depressive disorder, recurrent, unspecified: Secondary | ICD-10-CM

## 2021-11-29 DIAGNOSIS — F4310 Post-traumatic stress disorder, unspecified: Secondary | ICD-10-CM

## 2021-11-29 LAB — COMPREHENSIVE METABOLIC PANEL
ALT: 15 U/L (ref 0–35)
AST: 16 U/L (ref 0–37)
Albumin: 4.4 g/dL (ref 3.5–5.2)
Alkaline Phosphatase: 85 U/L (ref 39–117)
BUN: 18 mg/dL (ref 6–23)
CO2: 28 mEq/L (ref 19–32)
Calcium: 10.3 mg/dL (ref 8.4–10.5)
Chloride: 103 mEq/L (ref 96–112)
Creatinine, Ser: 0.9 mg/dL (ref 0.40–1.20)
GFR: 61.3 mL/min (ref >60.00)
Glucose, Bld: 74 mg/dL (ref 70–99)
Potassium: 4.5 mEq/L (ref 3.5–5.1)
Sodium: 140 mEq/L (ref 135–145)
Total Bilirubin: 1 mg/dL (ref 0.2–1.2)
Total Protein: 6.6 g/dL (ref 6.0–8.3)

## 2021-11-29 LAB — LIPID PANEL
Cholesterol: 157 mg/dL (ref 0–200)
HDL: 57 mg/dL (ref >39.00)
LDL Cholesterol: 79 mg/dL (ref 0–99)
NonHDL: 100.14
Total CHOL/HDL Ratio: 3
Triglycerides: 107 mg/dL (ref 0.0–149.0)
VLDL: 21.4 mg/dL (ref 0.0–40.0)

## 2021-11-29 LAB — TSH: TSH: 1.91 u[IU]/mL (ref 0.35–5.50)

## 2021-11-29 MED ORDER — ALENDRONATE SODIUM 70 MG PO TABS: 70.0000 mg | ORAL_TABLET | ORAL | Status: DC

## 2021-11-29 MED ORDER — RISPERIDONE 0.5 MG PO TBDP: 0.5000 mg | ORAL_TABLET | Freq: Every day | ORAL | 1 refills | Status: DC

## 2021-11-29 MED ORDER — ATORVASTATIN CALCIUM 20 MG PO TABS: 20.0000 mg | ORAL_TABLET | Freq: Every day | ORAL | 1 refills | Status: DC

## 2021-11-29 MED ORDER — VENLAFAXINE HCL ER 150 MG PO CP24: 150.0000 mg | ORAL_CAPSULE | Freq: Every day | ORAL | 1 refills | Status: DC

## 2021-11-29 MED ORDER — BUSPIRONE HCL 10 MG PO TABS: 10.0000 mg | ORAL_TABLET | Freq: Three times a day (TID) | ORAL | 1 refills | Status: DC

## 2021-11-29 MED ORDER — LOSARTAN POTASSIUM 100 MG PO TABS: 100.0000 mg | ORAL_TABLET | Freq: Every day | ORAL | 1 refills | Status: DC

## 2021-11-29 MED ORDER — LORAZEPAM 0.5 MG PO TABS: ORAL_TABLET | ORAL | 1 refills | Status: DC

## 2021-11-29 NOTE — Progress Notes (Signed)
OFFICE VISIT ? ?11/29/2021 ? ?CC:  ?Chief Complaint  ?Patient presents with  ? Hypertension  ? Hyperlipidemia  ?  Pt is fasting  ? Hypothyroidism  ? ?HPI:   ? ?Patient is a 81 y.o. female who presents for 5 mo f/u HTN, HLD, hypothyroidism, and CRI II/III. ?Her health is most affected by her chronic treatment resistant depression, GAD, and PTSD. ?A/P as of last visit: ?"#1: Treatment resistant depression, chronic anxiety, PTSD.  Stable but for the long-term she has been only in partial remission.  Discussed possibly increasing BuSpar and or venlafaxine today but she is not comfortable with this at this point.  We will keep these the same and continue Risperdal 0.5 mg at bedtime as well.  Discussed TMS therapy some today and she wanted to look over the info/pamphlet about it and consider it in the future.   ?  ?#2: Hypertension.  Well-controlled on amlodipine 10 mg a day and losartan 100 mg a day.  Checking electrolytes and creatinine today. ? ?3.  Hyperlipidemia.  Tolerating atorvastatin 20 mg/day.  LDL 79 3 three months ago. ?Plan repeat lipids 3 months. ?  ?#4 chronic renal insufficiency II/III (GFR 60): He avoids nonsteroidals.  Emphasized importance of good hydration.  Checking electrolytes and creatinine today. ? ?5.  Hypothyroidism: TSH 2.5 three months ago. ?I agreed in the recent past to assume responsibility for monitoring/managing this problem-she had formerly been seeing Dr. Chalmers Spencer." ? ?INTERIM HX: ?She says she is feeling about the same as usual. ?Anxiety still bothers her a lot.  Usually sleep is okay but last night she did not sleep much due to anticipation of today's visit. ?Has her 14th wedding anniversary coming up next month and is having family come in from out of town.  This has her anxious.  She asks about getting something to treat her acute anxiety and its associated sleep dysfunction. ?She does suffer from chronic depression but has been resistant to any medication changes in the past ? ? ?No  home blood pressure monitoring data today. ? ?She saw Dr. Michiel Sites, endocrinologist, on 09/16/2021 for her osteoporosis.  Patient had a DEXA scan earlier this year and T score was -3.3.  She was on no osteoporosis medication at that time. ?Carla Spencer says no specific medication treatment for osteoporosis was recommended although she was put on high-dose vitamin D a week or 2 ago.  Fosamax is on her current med list.  No records available from Dr. Rueben Spencer office at this time. ? ?ROS as above, plus--> no fevers, no CP, no SOB, no wheezing, no cough, no dizziness, no HAs, no rashes, no melena/hematochezia.  No polyuria or polydipsia.  No myalgias or arthralgias.  No focal weakness, paresthesias, or tremors.  No acute vision or hearing abnormalities.  No dysuria or unusual/new urinary urgency or frequency.  No recent changes in lower legs. ?No n/v/d or abd pain.  No palpitations.   ? ? ?Past Medical History:  ?Diagnosis Date  ? Anxiety and depression   ? with PTSD.  Carla Spencer ordered repeat MRI brain 2017 (no acute, no change from prior MR done in 2012) and referred pt to neuro for neuropsych testing but pt did not go.  Repeat MRI 10/2017 mild chronic microvasc isch change --stable compared to prior MRI.  ? Arthritis   ? Bilateral carpal tunnel syndrome   ? Cataracts, bilateral   ? Chronic renal insufficiency, stage II (mild) 2015  ? CrCl about 60 ml/min  ? Colitis   ?  hx of  ? DDD (degenerative disc disease), lumbar 2016  ? L1-2 and L5-S1 fairly severe--intramuscular injection of steroid and toradol by Dr. Antonietta Jewel 02/24/14 to calm down the pain  ? Enterocele 05/2016  ? Dx'd by Carla Spencer (GYN); he referred pt back to the MD at [facility] that did her prior bladder sling procedure.  No rectocele.  ? GERD (gastroesophageal reflux disease)   ? Goiter   ? Lobectomy (left) of thyroid for benign nodule.  Right lobe nodules followed by Dr. Chalmers Spencer (FNA 2012 benign goiter) with ultrasounds and have been stable, most recently 01/10/13.    ?  H/O adenomatous polyp of colon 2012; 05/2016  ? 5 yr recall  ? Hypercalcemia 10/2017  ? suspected to be due to lithium toxicity.  ? Hyperlipidemia   ? Hypertension   ? Lithium toxicity 2019  ? Lumbar spondylolysis 2016  ? L5: with grade I spondylolisthesis L5 on S1  ? Osteoporosis 2012; 2018  ? 2012-"penia".  2018 "porosis"--alendronate started 12/2016.  Repeat DEXA 12/2018. DEXA 08/2021 T score -3.3  ? PFO (patent foramen ovale) echo 03/21/05  ? "hole in heart" saw Carla Spencer [phone] [facility] hospital.  Plavix med mgmt.  Carla Spencer repeated echo w/bubble study 05/2016 and it showed NO PFO or ASD.  ? Subclinical hypothyroidism   ? Dr. Chalmers Spencer has her on 25 mcg synthroid qd as of 01/2014  ? TIA (transient ischemic attack)   ? patient on plavix,  saw Carla Spencer at [facility] in [place].  MRI brain 05/2016: mild chronic small vessel dz, o/w normal.  ? Urinary tract infection   ? hx of  ? Vertigo   ? ? ?Past Surgical History:  ?Procedure Laterality Date  ? ABDOMINAL HYSTERECTOMY    ? BLADDER SUSPENSION    ? BREAST EXCISIONAL BIOPSY Right 1960  ? BREAST EXCISIONAL BIOPSY Left   ? BREAST SURGERY    ? "breast lumps removed"  ? COLONOSCOPY W/ POLYPECTOMY  04/2011; 05/29/16  ? +Diverticulosis and int hem.  Adenomatous polyp 2012.  No polyps on repeat TCS 05/2016--recall 5 yrs (05/2021)  ? DEXA  05/23/2011  ? 2012 T-score -2.2. 12/2016 T-score -2.6.  08/2021 T score -3.3  ? DILATION AND CURETTAGE OF UTERUS    ? diverticulosis  05/2016  ? Noted on colonoscopy  ? RECTOCELE REPAIR    ? purse string  ? THYROIDECTOMY    ? Left lobectomy  ? TOTAL KNEE ARTHROPLASTY  07/26/2012  ? Procedure: TOTAL KNEE ARTHROPLASTY;  Surgeon: Yvette Rack., MD;  Location: [place];  Service: Orthopedics;  Laterality: Right;  ? TRANSTHORACIC ECHOCARDIOGRAM  05/24/2016  ? EF 60-65%, normal LV function.  No DD. No PFO or ASD (bubble study WAS done).  ? ? ?Outpatient Medications Prior to Visit  ?Medication Sig Dispense Refill  ?  amLODipine (NORVASC) 10 MG tablet Take 1 tablet (10 mg total) by mouth daily. 90 tablet 3  ? aspirin EC 81 MG tablet Take 1 tablet (81 mg total) by mouth daily. 30 tablet 0  ? levothyroxine (SYNTHROID) 50 MCG tablet Take 1 tablet (50 mcg total) by mouth daily before breakfast. 90 tablet 3  ? Multiple Vitamin (MV-ONE) CAPS Take 1 capsule by mouth daily.    ? Omega-3 Fatty Acids (FISH OIL) 500 MG CAPS Take 1 capsule by mouth daily.    ? Vitamin D, Ergocalciferol, (DRISDOL) 1.25 MG (50000 UNIT) CAPS capsule Take by mouth.    ? alendronate (FOSAMAX) 70 MG  tablet Take by mouth.    ? atorvastatin (LIPITOR) 20 MG tablet Take 1 tablet by mouth once daily 90 tablet 0  ? busPIRone (BUSPAR) 10 MG tablet TAKE 1 TABLET BY MOUTH THREE TIMES DAILY 90 tablet 0  ? ketoconazole (NIZORAL) 2 % cream Apply topically. (Patient not taking: Reported on 06/21/2021)    ? losartan (COZAAR) 100 MG tablet Take 1 tablet (100 mg total) by mouth daily. 90 tablet 3  ? risperiDONE (RISPERDAL M-TABS) 0.5 MG disintegrating tablet Take 1 tablet (0.5 mg total) by mouth at bedtime. 90 tablet 1  ? venlafaxine XR (EFFEXOR-XR) 150 MG 24 hr capsule Take 1 capsule (150 mg total) by mouth daily with breakfast. 90 capsule 3  ? ?No facility-administered medications prior to visit.  ? ? ?Allergies  ?Allergen Reactions  ? Lamictal [Lamotrigine] Other (See Comments)  ? Lithium Other (See Comments)  ?  toxicity  ? Phenylephrine Rash  ?  Patient had allergic reaction to dilation combo drop. Please dilate with Tropicamide 0.5% only with Fluress and/or Proparacaine   ? Sulfa Drugs Cross Reactors Rash  ? ? ?ROS ?As per HPI ? ?PE: ? ?  11/29/2021  ? 10:54 AM 06/21/2021  ?  9:22 AM 03/17/2021  ?  9:22 AM  ?Vitals with BMI  ?Height '5\' 2"'$  '5\' 2"'$  '5\' 2"'$   ?Weight 155 lbs 6 oz 156 lbs 13 oz 161 lbs 6 oz  ?BMI 28.42 28.67 29.51  ?Systolic 297 989 211  ?Diastolic 71 67 69  ?Pulse 76 75 72  ? ? ? ?Physical Exam ? ?Gen: Alert, well appearing.  Patient is oriented to person, place, time,  and situation. ?AFFECT: pleasant, lucid thought and speech. ?No further exam today. ? ?LABS:  ?Last CBC ?Lab Results  ?Component Value Date  ? WBC 6.3 03/17/2021  ? HGB 13.8 03/17/2021  ? HCT 42.2 03/17/2021  ? [place]

## 2022-02-16 DIAGNOSIS — E890 Postprocedural hypothyroidism: Secondary | ICD-10-CM | POA: Diagnosis not present

## 2022-02-16 DIAGNOSIS — E559 Vitamin D deficiency, unspecified: Secondary | ICD-10-CM | POA: Diagnosis not present

## 2022-03-07 ENCOUNTER — Telehealth: Payer: Self-pay | Admitting: Family Medicine

## 2022-03-07 NOTE — Telephone Encounter (Signed)
Left message for patient to schedule Annual Wellness Visit.  Please schedule (telephone/video call) with Nurse Health Advisor Tina Betterson, RN at [facility] [place]. Please call [phone] ask for Kathy 

## 2022-03-22 ENCOUNTER — Telehealth: Payer: Self-pay

## 2022-03-22 NOTE — Telephone Encounter (Signed)
Spoke with pt to schedule AWV in office. Patient declined to schedule wellness visit at this time.   

## 2022-03-29 ENCOUNTER — Ambulatory Visit (INDEPENDENT_AMBULATORY_CARE_PROVIDER_SITE_OTHER): Payer: Medicare Other

## 2022-03-29 DIAGNOSIS — Z0000 Encounter for general adult medical examination without abnormal findings: Secondary | ICD-10-CM

## 2022-03-29 NOTE — Progress Notes (Addendum)
Virtual Visit via Telephone Note  I connected with  Carla Spencer on 03/29/22 at  1:00 PM EDT by telephone and verified that I am speaking with the correct person using two identifiers.  Medicare Annual Wellness visit completed telephonically due to Covid-19 pandemic.   Persons participating in this call: This Health Coach and this patient.   Location: Patient: home Provider: office    I discussed the limitations, risks, security and privacy concerns of performing an evaluation and management service by telephone and the availability of in person appointments. The patient expressed understanding and agreed to proceed.  Unable to perform video visit due to video visit attempted and failed and/or patient does not have video capability.   Some vital signs may be absent or patient reported.   Willette Brace, LPN   Subjective:   Carla Spencer is a 81 y.o. female who presents for Medicare Annual (Subsequent) preventive examination.  Review of Systems     Cardiac Risk Factors include: advanced age (>42mn, >>34women);hypertension     Objective:    There were no vitals filed for this visit. There is no height or weight on file to calculate BMI.     03/29/2022    1:10 PM 03/02/2021   11:27 AM 07/26/2012    6:12 AM 07/19/2012   10:20 AM  Advanced Directives  Does Patient Have a Medical Advance Directive? Yes Yes  Patient does not have advance directive  Type of AParamedicof AGreenbush[place] will    Copy of [place] Chart? No - copy requested Yes - validated most recent copy scanned in chart (See row information)    Pre-existing out of facility DNR order (yellow form or pink MOST form)   No     Current Medications (verified) Outpatient Encounter Medications as of 03/29/2022  Medication Sig   alendronate (FOSAMAX) 70 MG tablet Take 1 tablet (70 mg total) by mouth once a week.   amLODipine (NORVASC) 10 MG  tablet Take 1 tablet (10 mg total) by mouth daily.   aspirin EC 81 MG tablet Take 1 tablet (81 mg total) by mouth daily.   atorvastatin (LIPITOR) 20 MG tablet Take 1 tablet (20 mg total) by mouth daily.   busPIRone (BUSPAR) 10 MG tablet Take 1 tablet (10 mg total) by mouth 3 (three) times daily.   levothyroxine (SYNTHROID) 50 MCG tablet Take 1 tablet (50 mcg total) by mouth daily before breakfast.   LORazepam (ATIVAN) 0.5 MG tablet 1-2 tabs po bid prn anxiety   losartan (COZAAR) 100 MG tablet Take 1 tablet (100 mg total) by mouth daily.   Multiple Vitamin (MV-ONE) CAPS Take 1 capsule by mouth daily.   Omega-3 Fatty Acids (FISH OIL) 500 MG CAPS Take 1 capsule by mouth daily.   risperiDONE (RISPERDAL M-TABS) 0.5 MG disintegrating tablet Take 1 tablet (0.5 mg total) by mouth at bedtime.   venlafaxine XR (EFFEXOR-XR) 150 MG 24 hr capsule Take 1 capsule (150 mg total) by mouth daily with breakfast.   Vitamin D, Ergocalciferol, (DRISDOL) 1.25 MG (50000 UNIT) CAPS capsule Take by mouth.   No facility-administered encounter medications on file as of 03/29/2022.    Allergies (verified) Lamictal [lamotrigine], Lithium, Phenylephrine, and Sulfa drugs cross reactors   History: Past Medical History:  Diagnosis Date   Anxiety and depression    with PTSD.  Dr. NJohnsie Cancelordered repeat MRI brain 2017 (no acute, no change from prior MR  done in 2012) and referred pt to neuro for neuropsych testing but pt did not go.  Repeat MRI 10/2017 mild chronic microvasc isch change --stable compared to prior MRI.   Arthritis    Bilateral carpal tunnel syndrome    Cataracts, bilateral    Chronic renal insufficiency, stage II (mild) 2015   CrCl about 60 ml/min   Colitis    hx of   DDD (degenerative disc disease), lumbar 2016   L1-2 and L5-S1 fairly severe--intramuscular injection of steroid and toradol by Dr. Antonietta Jewel 02/24/14 to calm down the pain   Enterocele 05/2016   Dx'd by Dr. Toney Rakes (GYN); he referred pt back  to the MD at [facility] that did her prior bladder sling procedure.  No rectocele.   GERD (gastroesophageal reflux disease)    Goiter    Lobectomy (left) of thyroid for benign nodule.  Right lobe nodules followed by Dr. Chalmers Cater (FNA 2012 benign goiter) with ultrasounds and have been stable, most recently 01/10/13.     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   Hyperlipidemia    Hypertension    Lithium toxicity 2019   Lumbar spondylolysis 2016   L5: with grade I spondylolisthesis L5 on S1   Osteoporosis 2012; 2018   2012-"penia".  2018 "porosis"--alendronate started 12/2016.  Repeat DEXA 12/2018. DEXA 08/2021 T score -3.3   PFO (patent foramen ovale) echo 03/21/05   "hole in heart" saw Dr. Sharee Holster [phone] [facility]. hospital.  Plavix med mgmt.  Dr. Johnsie Cancel repeated echo w/bubble study 05/2016 and it showed NO PFO or ASD.   Subclinical hypothyroidism    Dr. Chalmers Cater has her on 25 mcg synthroid qd as of 01/2014   TIA (transient ischemic attack)    patient on plavix,  saw Dr. Isabell Jarvis at [facility].hospital in [place].  MRI brain 05/2016: mild chronic small vessel dz, o/w normal.   Urinary tract infection    hx of   Vertigo    Past Surgical History:  Procedure Laterality Date   ABDOMINAL HYSTERECTOMY     BLADDER SUSPENSION     BREAST EXCISIONAL BIOPSY Right 1960   BREAST EXCISIONAL BIOPSY Left    BREAST SURGERY     "breast lumps removed"   COLONOSCOPY W/ POLYPECTOMY  04/2011; 05/29/16   +Diverticulosis and int hem.  Adenomatous polyp 2012.  No polyps on repeat TCS 05/2016--recall 5 yrs (05/2021)   DEXA  05/23/2011   2012 T-score -2.2. 12/2016 T-score -2.6.  08/2021 T score -3.3   DILATION AND CURETTAGE OF UTERUS     diverticulosis  05/2016   Noted on colonoscopy   RECTOCELE REPAIR     purse string   THYROIDECTOMY     Left lobectomy   TOTAL KNEE ARTHROPLASTY  07/26/2012   Procedure: TOTAL KNEE  ARTHROPLASTY;  Surgeon: Yvette Rack., MD;  Location: [place];  Service: Orthopedics;  Laterality: Right;   TRANSTHORACIC ECHOCARDIOGRAM  05/24/2016   EF 60-65%, normal LV function.  No DD. No PFO or ASD (bubble study WAS done).   Family History  Problem Relation Age of Onset   Heart disease Mother    Stomach cancer Father    Cancer Father    Colon polyps Daughter    Diabetes Cousin    Colon cancer Neg Hx    Social History   Socioeconomic History   Marital status: Married  Spouse name: Not on file   Number of children: 4   Years of education: Not on file   Highest education level: Not on file  Occupational History   Occupation: Retired  Tobacco Use   Smoking status: Former   Smokeless tobacco: Never   Tobacco comments:    smoked 1/4 of pack from 1961-1967  Vaping Use   Vaping Use: Never used  Substance and Sexual Activity   Alcohol use: No   Drug use: No   Sexual activity: Not Currently  Other Topics Concern   Not on file  Social History Narrative   Married, 4 children.   Orig from [place], relocated [facility] 2012.   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  Retired in her 80s.   Volunteered a lot.   Tob: minimal, quit 1969.   Alc: none   Social Determinants of Health   Financial Resource Strain: Low Risk  (03/29/2022)   Overall Financial Resource Strain (CARDIA)    Difficulty of Paying Living Expenses: Not hard at all  Food Insecurity: No Food Insecurity (03/29/2022)   Hunger Vital Sign    Worried About Running Out of Food in the Last Year: Never true    Ran Out of Food in the Last Year: Never true  Transportation Needs: No Transportation Needs (03/29/2022)   PRAPARE - Hydrologist (Medical): No    Lack of Transportation (Non-Medical): No  Physical Activity: Inactive (03/29/2022)   Exercise Vital Sign    Days of Exercise per Week: 0 days    Minutes of Exercise per Session: 0 min  Stress: No Stress Concern Present  (03/29/2022)   Winifred    Feeling of Stress : Not at all  Social Connections: Moderately Isolated (03/29/2022)   Social Connection and Isolation Panel [NHANES]    Frequency of Communication with Friends and Family: More than three times a week    Frequency of Social Gatherings with Friends and Family: Not on file    Attends Religious Services: Never    Marine scientist or Organizations: No    Attends Archivist Meetings: Never    Marital Status: Married    Tobacco Counseling Counseling given: Not Answered Tobacco comments: smoked 1/4 of pack from 1961-1967   Clinical Intake:  Pre-visit preparation completed: Yes  Pain : No/denies pain     BMI - recorded: 28.42 Nutritional Status: BMI 25 -29 Overweight Nutritional Risks: None Diabetes: No  How often do you need to have someone help you when you read instructions, pamphlets, or other written materials from your doctor or pharmacy?: 1 - Never  Diabetic?no  Interpreter Needed?: No  Information entered by :: Charlott Rakes, LPN   Activities of Daily Living    03/29/2022    1:11 PM  In your present state of health, do you have any difficulty performing the following activities:  Hearing? 1  Vision? 0  Difficulty concentrating or making decisions? 0  Walking or climbing stairs? 0  Dressing or bathing? 0  Doing errands, shopping? 0  Preparing Food and eating ? N  Using the Toilet? N  In the past six months, have you accidently leaked urine? N  Do you have problems with loss of bowel control? N  Managing your Medications? N  Managing your Finances? N  Housekeeping or managing your Housekeeping? N    Patient Care Team: Tammi Sou, MD as  PCP - General (Family Medicine) Jacelyn Pi, MD as Consulting Physician (Endocrinology) Laroy Apple, MD as Consulting Physician (Physical Medicine and Rehabilitation) Merian Capron,  MD as Consulting Physician (Psychiatry) Ladene Artist, MD as Consulting Physician (Gastroenterology) Earlie Server, MD as Consulting Physician (Orthopedic Surgery) Eben Burow, MD as Consulting Physician (Ophthalmology) Josue Hector, MD as Consulting Physician (Cardiology) Terrance Mass, MD (Inactive) as Consulting Physician (Gynecology)  Indicate any recent Medical Services you may have received from other than [facility] providers in the past year (date may be approximate).     Assessment:   This is a routine wellness examination for [facility].  Hearing/Vision screen Hearing Screening - Comments:: Pt stated slight hearing loss  Vision Screening - Comments:: Pt follows up with dr Trinna Post  for annual eye exams   Dietary issues and exercise activities discussed: Current Exercise Habits: The patient does not participate in regular exercise at present   Goals Addressed             This Visit's Progress    Patient Stated       None at this time       Depression Screen    03/29/2022    1:09 PM 11/29/2021   11:30 AM 06/21/2021    9:35 AM 03/17/2021    9:25 AM 03/02/2021   11:20 AM 12/08/2020   10:40 AM 09/08/2020   10:32 AM  PHQ 2/9 Scores  PHQ - 2 Score 0 3 2  0 2 5  PHQ- 9 Score  '9 6   6 14  '$ Exception Documentation    Medical reason       Fall Risk    03/29/2022    1:11 PM 11/29/2021   10:55 AM 06/21/2021    9:28 AM 03/02/2021   11:28 AM 09/08/2020   10:32 AM  Fall Risk   Falls in the past year? 0 0 0 0 0  Number falls in past yr: 0 0 0 0 0  Injury with Fall? 0 0 0 0 0  Risk for fall due to : Impaired mobility      Follow up Falls prevention discussed Falls evaluation completed Falls evaluation completed Falls evaluation completed;Falls prevention discussed Falls evaluation completed    FALL RISK PREVENTION PERTAINING TO THE HOME:  Any stairs in or around the home? Yes  If so, are there any without handrails? No  Home free of loose throw rugs in walkways,  pet beds, electrical cords, etc? Yes  Adequate lighting in your home to reduce risk of falls? Yes   ASSISTIVE DEVICES UTILIZED TO PREVENT FALLS:  Life alert? No  Use of a cane, walker or w/c? No  Grab bars in the bathroom? No  Shower chair or bench in shower? Yes  Elevated toilet seat or a handicapped toilet? No   TIMED UP AND GO:  Was the test performed? no   Cognitive Function:        03/29/2022    1:14 PM  6CIT Screen  What Year? 0 points  What month? 0 points  What time? 0 points  Count back from 20 0 points  Months in reverse 0 points  Repeat phrase 0 points  Total Score 0 points    Immunizations Immunization History  Administered Date(s) Administered   Fluad Quad(high Dose 65+) 05/29/2019, 06/10/2020, 06/21/2021   Influenza, High Dose Seasonal PF 06/06/2013, 06/05/2017, 05/31/2018   Influenza,inj,Quad PF,6+ Mos 04/20/2015, 05/17/2016   PFIZER(Purple Top)SARS-COV-2 Vaccination 09/18/2019, 10/13/2019, 06/29/2020   Pneumococcal  Conjugate-13 12/24/2013   Pneumococcal Polysaccharide-23 04/20/2015   Tdap 04/20/2015   Zoster, Live 07/04/2011    TDAP status: Up to date  Flu Vaccine status: Up to date  Pneumococcal vaccine status: Up to date  Covid-19 vaccine status: Completed vaccines  Qualifies for Shingles Vaccine? Yes   Zostavax completed No   Shingrix Completed?: No.    Education has been provided regarding the importance of this vaccine. Patient has been advised to call insurance company to determine out of pocket expense if they have not yet received this vaccine. Advised may also receive vaccine at local pharmacy or Health Dept. Verbalized acceptance and understanding.  Screening Tests Health Maintenance  Topic Date Due   Zoster Vaccines- Shingrix (1 of 2) Never done   COVID-19 Vaccine (4 - Pfizer risk series) 08/24/2020   COLONOSCOPY (Pts 45-57yr Insurance coverage will need to be confirmed)  05/29/2021   INFLUENZA VACCINE  03/14/2022   MAMMOGRAM   09/13/2022   TETANUS/TDAP  04/19/2025   Pneumonia Vaccine 81 Years old  Completed   DEXA SCAN  Completed   HPV VACCINES  Aged Out   Hepatitis C Screening  Discontinued    Health Maintenance  Health Maintenance Due  Topic Date Due   Zoster Vaccines- Shingrix (1 of 2) Never done   COVID-19 Vaccine (4 - Pfizer risk series) 08/24/2020   COLONOSCOPY (Pts 45-44yrInsurance coverage will need to be confirmed)  05/29/2021   INFLUENZA VACCINE  03/14/2022    Colorectal cancer screening: Type of screening: Colonoscopy. Completed 05/29/16. Repeat every 5 years pt stated she will schedule appt   Mammogram status: Completed 09/13/21. Repeat every year  Bone Density status: Completed 09/05/21. Results reflect: Bone density results: OSTEOPOROSIS. Repeat every 2 years.    Additional Screening:  Hepatitis C Screening: does not qualify;  Vision Screening: Recommended annual ophthalmology exams for early detection of glaucoma and other disorders of the eye. Is the patient up to date with their annual eye exam?  Yes  Who is the provider or what is the name of the office in which the patient attends annual eye exams? Dr FoTrinna PostIf pt is not established with a provider, would they like to be referred to a provider to establish care? No .   Dental Screening: Recommended annual dental exams for proper oral hygiene  Community Resource Referral / Chronic Care Management: CRR required this visit?  No   CCM required this visit?  No      Plan:     I have personally reviewed and noted the following in the patient's chart:   Medical and social history Use of alcohol, tobacco or illicit drugs  Current medications and supplements including opioid prescriptions.  Functional ability and status Nutritional status Physical activity Advanced directives List of other physicians Hospitalizations, surgeries, and ER visits in previous 12 months Vitals Screenings to include cognitive, depression, and  falls Referrals and appointments  In addition, I have reviewed and discussed with patient certain preventive protocols, quality metrics, and best practice recommendations. A written personalized care plan for preventive services as well as general preventive health recommendations were provided to patient.     TiWillette BraceLPN   03/14/94/0932 Nurse Notes: none

## 2022-03-29 NOTE — Patient Instructions (Signed)
Carla Spencer , Thank you for taking time to come for your Medicare Wellness Visit. I appreciate your ongoing commitment to your health goals. Please review the following plan we discussed and let me know if I can assist you in the future.   Screening recommendations/referrals: Colonoscopy: Done 05/29/16 repeat every  Mammogram: 09/13/21 repeat every year  Bone Density: done 09/05/21 repeat every 2 years  Recommended yearly ophthalmology/optometry visit for glaucoma screening and checkup Recommended yearly dental visit for hygiene and checkup  Vaccinations: Influenza vaccine: done 06/21/21 repeat every year  Pneumococcal vaccine: Up to date Tdap vaccine: done 04/20/15 repeat every 10 years  Shingles vaccine: Shingrix discussed. Please contact your pharmacy for coverage information.  Covid-19:completed   Advanced directives: copies in chart   Conditions/risks identified: none at this time   Next appointment: Follow up in one year for your annual wellness visit    Preventive Care 65 Years and Older, Female Preventive care refers to lifestyle choices and visits with your health care provider that can promote health and wellness. What does preventive care include? A yearly physical exam. This is also called an annual well check. Dental exams once or twice a year. Routine eye exams. Ask your health care provider how often you should have your eyes checked. Personal lifestyle choices, including: Daily care of your teeth and gums. Regular physical activity. Eating a healthy diet. Avoiding tobacco and drug use. Limiting alcohol use. Practicing safe sex. Taking low-dose aspirin every day. Taking vitamin and mineral supplements as recommended by your health care provider. What happens during an annual well check? The services and screenings done by your health care provider during your annual well check will depend on your age, overall health, lifestyle risk factors, and family history of  disease. Counseling  Your health care provider may ask you questions about your: Alcohol use. Tobacco use. Drug use. Emotional well-being. Home and relationship well-being. Sexual activity. Eating habits. History of falls. Memory and ability to understand (cognition). Work and work Statistician. Reproductive health. Screening  You may have the following tests or measurements: Height, weight, and BMI. Blood pressure. Lipid and cholesterol levels. These may be checked every 5 years, or more frequently if you are over 79 years old. Skin check. Lung cancer screening. You may have this screening every year starting at age 79 if you have a 30-pack-year history of smoking and currently smoke or have quit within the past 15 years. Fecal occult blood test (FOBT) of the stool. You may have this test every year starting at age 79. Flexible sigmoidoscopy or colonoscopy. You may have a sigmoidoscopy every 5 years or a colonoscopy every 10 years starting at age 79. Hepatitis C blood test. Hepatitis B blood test. Sexually transmitted disease (STD) testing. Diabetes screening. This is done by checking your blood sugar (glucose) after you have not eaten for a while (fasting). You may have this done every 1-3 years. Bone density scan. This is done to screen for osteoporosis. You may have this done starting at age 79. Mammogram. This may be done every 1-2 years. Talk to your health care provider about how often you should have regular mammograms. Talk with your health care provider about your test results, treatment options, and if necessary, the need for more tests. Vaccines  Your health care provider may recommend certain vaccines, such as: Influenza vaccine. This is recommended every year. Tetanus, diphtheria, and acellular pertussis (Tdap, Td) vaccine. You may need a Td booster every 10 years. Zoster vaccine.  You may need this after age 79. Pneumococcal 13-valent conjugate (PCV13) vaccine. One  dose is recommended after age 79. Pneumococcal polysaccharide (PPSV23) vaccine. One dose is recommended after age 12. Talk to your health care provider about which screenings and vaccines you need and how often you need them. This information is not intended to replace advice given to you by your health care provider. Make sure you discuss any questions you have with your health care provider. Document Released: 08/27/2015 Document Revised: 04/19/2016 Document Reviewed: 06/01/2015 Elsevier Interactive Patient Education  2017 [place] Prevention in the Home Falls can cause injuries. They can happen to people of all ages. There are many things you can do to make your home safe and to help prevent falls. What can I do on the outside of my home? Regularly fix the edges of walkways and driveways and fix any cracks. Remove anything that might make you trip as you walk through a door, such as a raised step or threshold. Trim any bushes or trees on the path to your home. Use bright outdoor lighting. Clear any walking paths of anything that might make someone trip, such as rocks or tools. Regularly check to see if handrails are loose or broken. Make sure that both sides of any steps have handrails. Any raised decks and porches should have guardrails on the edges. Have any leaves, snow, or ice cleared regularly. Use sand or salt on walking paths during winter. Clean up any spills in your garage right away. This includes oil or grease spills. What can I do in the bathroom? Use night lights. Install grab bars by the toilet and in the tub and shower. Do not use towel bars as grab bars. Use non-skid mats or decals in the tub or shower. If you need to sit down in the shower, use a plastic, non-slip stool. Keep the floor dry. Clean up any water that spills on the floor as soon as it happens. Remove soap buildup in the tub or shower regularly. Attach bath mats securely with double-sided  non-slip rug tape. Do not have throw rugs and other things on the floor that can make you trip. What can I do in the bedroom? Use night lights. Make sure that you have a light by your bed that is easy to reach. Do not use any sheets or blankets that are too big for your bed. They should not hang down onto the floor. Have a firm chair that has side arms. You can use this for support while you get dressed. Do not have throw rugs and other things on the floor that can make you trip. What can I do in the kitchen? Clean up any spills right away. Avoid walking on wet floors. Keep items that you use a lot in easy-to-reach places. If you need to reach something above you, use a strong step stool that has a grab bar. Keep electrical cords out of the way. Do not use floor polish or wax that makes floors slippery. If you must use wax, use non-skid floor wax. Do not have throw rugs and other things on the floor that can make you trip. What can I do with my stairs? Do not leave any items on the stairs. Make sure that there are handrails on both sides of the stairs and use them. Fix handrails that are broken or loose. Make sure that handrails are as long as the stairways. Check any carpeting to make sure that it is  firmly attached to the stairs. Fix any carpet that is loose or worn. Avoid having throw rugs at the top or bottom of the stairs. If you do have throw rugs, attach them to the floor with carpet tape. Make sure that you have a light switch at the top of the stairs and the bottom of the stairs. If you do not have them, ask someone to add them for you. What else can I do to help prevent falls? Wear shoes that: Do not have high heels. Have rubber bottoms. Are comfortable and fit you well. Are closed at the toe. Do not wear sandals. If you use a stepladder: Make sure that it is fully opened. Do not climb a closed stepladder. Make sure that both sides of the stepladder are locked into place. Ask  someone to hold it for you, if possible. Clearly mark and make sure that you can see: Any grab bars or handrails. First and last steps. Where the edge of each step is. Use tools that help you move around (mobility aids) if they are needed. These include: Canes. Walkers. Scooters. Crutches. Turn on the lights when you go into a dark area. Replace any light bulbs as soon as they burn out. Set up your furniture so you have a clear path. Avoid moving your furniture around. If any of your floors are uneven, fix them. If there are any pets around you, be aware of where they are. Review your medicines with your doctor. Some medicines can make you feel dizzy. This can increase your chance of falling. Ask your doctor what other things that you can do to help prevent falls. This information is not intended to replace advice given to you by your health care provider. Make sure you discuss any questions you have with your health care provider. Document Released: 05/27/2009 Document Revised: 01/06/2016 Document Reviewed: 09/04/2014 Elsevier Interactive Patient Education  2017 [employer].

## 2022-04-11 ENCOUNTER — Other Ambulatory Visit: Payer: Self-pay | Admitting: Family Medicine

## 2022-04-13 ENCOUNTER — Other Ambulatory Visit (HOSPITAL_BASED_OUTPATIENT_CLINIC_OR_DEPARTMENT_OTHER): Payer: Self-pay

## 2022-04-13 DIAGNOSIS — H52203 Unspecified astigmatism, bilateral: Secondary | ICD-10-CM | POA: Diagnosis not present

## 2022-04-13 DIAGNOSIS — D23122 Other benign neoplasm of skin of left lower eyelid, including canthus: Secondary | ICD-10-CM | POA: Diagnosis not present

## 2022-04-13 DIAGNOSIS — H16223 Keratoconjunctivitis sicca, not specified as Sjogren's, bilateral: Secondary | ICD-10-CM | POA: Diagnosis not present

## 2022-04-13 DIAGNOSIS — H2513 Age-related nuclear cataract, bilateral: Secondary | ICD-10-CM | POA: Diagnosis not present

## 2022-04-13 DIAGNOSIS — D23112 Other benign neoplasm of skin of right lower eyelid, including canthus: Secondary | ICD-10-CM | POA: Diagnosis not present

## 2022-04-13 DIAGNOSIS — H43393 Other vitreous opacities, bilateral: Secondary | ICD-10-CM | POA: Diagnosis not present

## 2022-04-13 DIAGNOSIS — H524 Presbyopia: Secondary | ICD-10-CM | POA: Diagnosis not present

## 2022-04-13 DIAGNOSIS — H5203 Hypermetropia, bilateral: Secondary | ICD-10-CM | POA: Diagnosis not present

## 2022-04-13 MED ORDER — ZOSTER VAC RECOMB ADJUVANTED 50 MCG/0.5ML IM SUSR
INTRAMUSCULAR | 0 refills | Status: DC
  Filled 2022-04-13: qty 0.5, 1d supply, fill #0

## 2022-05-31 ENCOUNTER — Ambulatory Visit (INDEPENDENT_AMBULATORY_CARE_PROVIDER_SITE_OTHER): Payer: Medicare Other | Admitting: Family Medicine

## 2022-05-31 ENCOUNTER — Encounter: Payer: Self-pay | Admitting: Family Medicine

## 2022-05-31 VITALS — BP 119/84 | HR 76 | Temp 98.4°F | Ht 62.0 in | Wt 155.6 lb

## 2022-05-31 DIAGNOSIS — F411 Generalized anxiety disorder: Secondary | ICD-10-CM | POA: Diagnosis not present

## 2022-05-31 DIAGNOSIS — Z23 Encounter for immunization: Secondary | ICD-10-CM | POA: Diagnosis not present

## 2022-05-31 DIAGNOSIS — F339 Major depressive disorder, recurrent, unspecified: Secondary | ICD-10-CM

## 2022-05-31 DIAGNOSIS — E559 Vitamin D deficiency, unspecified: Secondary | ICD-10-CM

## 2022-05-31 DIAGNOSIS — E7800 Pure hypercholesterolemia, unspecified: Secondary | ICD-10-CM

## 2022-05-31 DIAGNOSIS — I10 Essential (primary) hypertension: Secondary | ICD-10-CM

## 2022-05-31 DIAGNOSIS — F4310 Post-traumatic stress disorder, unspecified: Secondary | ICD-10-CM

## 2022-05-31 DIAGNOSIS — M810 Age-related osteoporosis without current pathological fracture: Secondary | ICD-10-CM

## 2022-05-31 DIAGNOSIS — E039 Hypothyroidism, unspecified: Secondary | ICD-10-CM | POA: Diagnosis not present

## 2022-05-31 LAB — COMPREHENSIVE METABOLIC PANEL WITH GFR
ALT: 14 U/L (ref 0–35)
AST: 15 U/L (ref 0–37)
Albumin: 4.7 g/dL (ref 3.5–5.2)
Alkaline Phosphatase: 84 U/L (ref 39–117)
BUN: 17 mg/dL (ref 6–23)
CO2: 27 meq/L (ref 19–32)
Calcium: 11 mg/dL — ABNORMAL HIGH (ref 8.4–10.5)
Chloride: 106 meq/L (ref 96–112)
Creatinine, Ser: 1 mg/dL (ref 0.40–1.20)
GFR: 53.83 mL/min — ABNORMAL LOW
Glucose, Bld: 77 mg/dL (ref 70–99)
Potassium: 5.1 meq/L (ref 3.5–5.1)
Sodium: 142 meq/L (ref 135–145)
Total Bilirubin: 1 mg/dL (ref 0.2–1.2)
Total Protein: 7 g/dL (ref 6.0–8.3)

## 2022-05-31 LAB — LIPID PANEL
Cholesterol: 181 mg/dL (ref 0–200)
HDL: 65.2 mg/dL (ref >39.00)
LDL Cholesterol: 94 mg/dL (ref 0–99)
NonHDL: 116.15
Total CHOL/HDL Ratio: 3
Triglycerides: 110 mg/dL (ref 0.0–149.0)
VLDL: 22 mg/dL (ref 0.0–40.0)

## 2022-05-31 LAB — VITAMIN D 25 HYDROXY (VIT D DEFICIENCY, FRACTURES): VITD: 31.56 ng/mL (ref 30.00–100.00)

## 2022-05-31 LAB — TSH: TSH: 1.7 u[IU]/mL (ref 0.35–5.50)

## 2022-05-31 MED ORDER — VENLAFAXINE HCL ER 225 MG PO TB24: ORAL_TABLET | ORAL | 1 refills | Status: DC

## 2022-05-31 MED ORDER — BUSPIRONE HCL 10 MG PO TABS: 10.0000 mg | ORAL_TABLET | Freq: Three times a day (TID) | ORAL | 1 refills | Status: DC

## 2022-05-31 MED ORDER — AMLODIPINE BESYLATE 10 MG PO TABS: 10.0000 mg | ORAL_TABLET | Freq: Every day | ORAL | 1 refills | Status: DC

## 2022-05-31 MED ORDER — VENLAFAXINE HCL ER 150 MG PO CP24: 150.0000 mg | ORAL_CAPSULE | Freq: Every day | ORAL | 1 refills | Status: DC

## 2022-05-31 MED ORDER — LOSARTAN POTASSIUM 100 MG PO TABS: 100.0000 mg | ORAL_TABLET | Freq: Every day | ORAL | 1 refills | Status: DC

## 2022-05-31 MED ORDER — ATORVASTATIN CALCIUM 20 MG PO TABS: 20.0000 mg | ORAL_TABLET | Freq: Every day | ORAL | 1 refills | Status: DC

## 2022-05-31 MED ORDER — LEVOTHYROXINE SODIUM 50 MCG PO TABS: 50.0000 ug | ORAL_TABLET | Freq: Every day | ORAL | 1 refills | Status: DC

## 2022-05-31 MED ORDER — RISPERIDONE 0.5 MG PO TBDP: 0.5000 mg | ORAL_TABLET | Freq: Every day | ORAL | 1 refills | Status: DC

## 2022-05-31 NOTE — Progress Notes (Signed)
OFFICE VISIT  05/31/2022  CC:  Chief Complaint  Patient presents with   Hypertension    Pt is fasting   Hypothyroidism   Chronic Kidney Disease    Patient is a 81 y.o. female who presents for 6 mo f/u HTN, HLD, hypothyroidism, and CRI II/III. Her health is most affected by her chronic treatment resistant depression, GAD, and PTSD. A/P as of last visit: "#1 chronic anxiety and depression. She does not want to change any of her chronic medications at this time. However, we will add lorazepam 0.5, take 1-2 twice daily as needed increased anxiety. Therapeutic expectations and side effect profile of medication discussed today.  Patient's questions answered. Continue BuSpar 10 mg 3 times daily, Risperdal 0.5 mg nightly, and venlafaxine XR 150 mg a day.   #2 hypertension, well controlled on amlodipine 10 mg a day and losartan 100 mg a day. Electrolytes and creatinine today.  #3 hyperlipidemia.  Doing well on atorvastatin 20 mg a day. Lipid panel and hepatic panel today.     #4 hypothyroidism.  Hypoth: Takes T4 on empty stomach w/out any other meds. Continue levothyroxine 50 mcg daily. TSH today.   #5 osteoporosis. Hard to tell if patient is taking the Fosamax that is on her med list.  She seems to think not. She is followed by Dr. Chalmers Cater for this. Recently started on high-dose vitamin D supplement."  INTERIM HX: Carla Spencer reports nothing is significantly changed over the last 6 months. Still having frequent episodes of reliving past trauma.  Nearly on a daily basis she has random unprovoked episodes of feeling acutely anxious and sad, also some anger and frustration because she states she does not know why she feels the way she does. Sleep is a little better lately.  No SI or HI.  No home blood pressure monitoring.  PMP AWARE reviewed today: most recent rx for lorazepam was filled 11/29/2021, #30, rx by me. No red flags.  Past Medical History:  Diagnosis Date   Anxiety and  depression    with PTSD.  Dr. Johnsie Cancel ordered repeat MRI brain 2017 (no acute, no change from prior MR done in 2012) and referred pt to neuro for neuropsych testing but pt did not go.  Repeat MRI 10/2017 mild chronic microvasc isch change --stable compared to prior MRI.   Arthritis    Bilateral carpal tunnel syndrome    Cataracts, bilateral    Chronic renal insufficiency, stage II (mild) 2015   CrCl about 60 ml/min   Colitis    hx of   DDD (degenerative disc disease), lumbar 2016   L1-2 and L5-S1 fairly severe--intramuscular injection of steroid and toradol by Dr. Antonietta Jewel 02/24/14 to calm down the pain   Enterocele 05/2016   Dx'd by Dr. Toney Rakes (GYN); he referred pt back to the MD at [facility] that did her prior bladder sling procedure.  No rectocele.   GERD (gastroesophageal reflux disease)    Goiter    Lobectomy (left) of thyroid for benign nodule.  Right lobe nodules followed by Dr. Chalmers Cater (FNA 2012 benign goiter) with ultrasounds and have been stable, most recently 01/10/13.     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   Hyperlipidemia    Hypertension    Lithium toxicity 2019   Lumbar spondylolysis 2016   L5: with grade I spondylolisthesis L5 on S1   Osteoporosis 2012; 2018  2012-"penia".  2018 "porosis"--alendronate started 12/2016.  Repeat DEXA 12/2018. DEXA 08/2021 T score -3.3   PFO (patent foramen ovale) echo 03/21/05   "hole in heart" saw Dr. Sharee Holster [phone] [facility]. hospital.  Plavix med mgmt.  Dr. Johnsie Cancel repeated echo w/bubble study 05/2016 and it showed NO PFO or ASD.   Subclinical hypothyroidism    Dr. Chalmers Cater has her on 25 mcg synthroid qd as of 01/2014   TIA (transient ischemic attack)    patient on plavix,  saw Dr. Isabell Jarvis at [facility].hospital in [place].  MRI brain 05/2016: mild chronic small vessel dz, o/w normal.   Urinary tract infection    hx of   Vertigo     Past  Surgical History:  Procedure Laterality Date   ABDOMINAL HYSTERECTOMY     BLADDER SUSPENSION     BREAST EXCISIONAL BIOPSY Right 1960   BREAST EXCISIONAL BIOPSY Left    BREAST SURGERY     "breast lumps removed"   COLONOSCOPY W/ POLYPECTOMY  04/2011; 05/29/16   +Diverticulosis and int hem.  Adenomatous polyp 2012.  No polyps on repeat TCS 05/2016--recall 5 yrs (05/2021)   DEXA  05/23/2011   2012 T-score -2.2. 12/2016 T-score -2.6.  08/2021 T score -3.3   DILATION AND CURETTAGE OF UTERUS     diverticulosis  05/2016   Noted on colonoscopy   RECTOCELE REPAIR     purse string   THYROIDECTOMY     Left lobectomy   TOTAL KNEE ARTHROPLASTY  07/26/2012   Procedure: TOTAL KNEE ARTHROPLASTY;  Surgeon: Yvette Rack., MD;  Location: [facility];  Service: Orthopedics;  Laterality: Right;   TRANSTHORACIC ECHOCARDIOGRAM  05/24/2016   EF 60-65%, normal LV function.  No DD. No PFO or ASD (bubble study WAS done).    Outpatient Medications Prior to Visit  Medication Sig Dispense Refill   alendronate (FOSAMAX) 70 MG tablet Take 1 tablet (70 mg total) by mouth once a week.     amLODipine (NORVASC) 10 MG tablet Take 1 tablet (10 mg total) by mouth daily. 90 tablet 3   aspirin EC 81 MG tablet Take 1 tablet (81 mg total) by mouth daily. 30 tablet 0   atorvastatin (LIPITOR) 20 MG tablet Take 1 tablet (20 mg total) by mouth daily. 90 tablet 1   busPIRone (BUSPAR) 10 MG tablet Take 1 tablet (10 mg total) by mouth 3 (three) times daily. 90 tablet 1   levothyroxine (SYNTHROID) 50 MCG tablet TAKE 1 TABLET BY MOUTH ONCE DAILY BEFORE  BREAKFAST 90 tablet 0   LORazepam (ATIVAN) 0.5 MG tablet 1-2 tabs po bid prn anxiety 30 tablet 1   losartan (COZAAR) 100 MG tablet Take 1 tablet (100 mg total) by mouth daily. 90 tablet 1   Multiple Vitamin (MV-ONE) CAPS Take 1 capsule by mouth daily.     Omega-3 Fatty Acids (FISH OIL) 500 MG CAPS Take 1 capsule by mouth daily.     risperiDONE (RISPERDAL M-TABS) 0.5 MG disintegrating  tablet TAKE 1 TABLET BY MOUTH AT BEDTIME 90 tablet 0   venlafaxine XR (EFFEXOR-XR) 150 MG 24 hr capsule TAKE 1 CAPSULE BY MOUTH ONCE DAILY WITH BREAKFAST 90 capsule 0   Vitamin D, Ergocalciferol, (DRISDOL) 1.25 MG (50000 UNIT) CAPS capsule Take by mouth.     Zoster Vaccine Adjuvanted [facility]) injection Inject into the muscle. 0.5 mL 0   No facility-administered medications prior to visit.    Allergies  Allergen Reactions   Lamictal [Lamotrigine] Other (See Comments)  Lithium Other (See Comments)    toxicity   Phenylephrine Rash    Patient had allergic reaction to dilation combo drop. Please dilate with Tropicamide 0.5% only with Fluress and/or Proparacaine    Sulfa Drugs Cross Reactors Rash    ROS As per HPI  PE:    05/31/2022    9:55 AM 11/29/2021   10:54 AM 06/21/2021    9:22 AM  Vitals with BMI  Height '5\' 2"'$  '5\' 2"'$  '5\' 2"'$   Weight 155 lbs 10 oz 155 lbs 6 oz 156 lbs 13 oz  BMI 28.45 14.97 02.63  Systolic 785 885 027  Diastolic 84 71 67  Pulse 76 76 75     Physical Exam  Gen: Alert, well appearing.  Patient is oriented to person, place, time, and situation. Affect: Anxious and intermittently tearful.  Thought and speech are lucid. No further exam today.  LABS:  Last CBC Lab Results  Component Value Date   WBC 6.3 03/17/2021   HGB 13.8 03/17/2021   HCT 42.2 03/17/2021   MCV 89.8 03/17/2021   MCH 29.3 07/28/2012   RDW 13.7 03/17/2021   PLT 207.0 74/07/8785   Last metabolic panel Lab Results  Component Value Date   GLUCOSE 74 11/29/2021   NA 140 11/29/2021   K 4.5 11/29/2021   CL 103 11/29/2021   CO2 28 11/29/2021   BUN 18 11/29/2021   CREATININE 0.90 11/29/2021   GFRNONAA 53 (L) 05/24/2016   CALCIUM 10.3 11/29/2021   PHOS 2.9 07/02/2015   PROT 6.6 11/29/2021   ALBUMIN 4.4 11/29/2021   BILITOT 1.0 11/29/2021   ALKPHOS 85 11/29/2021   AST 16 11/29/2021   ALT 15 11/29/2021   Last lipids Lab Results  Component Value Date   CHOL 157 11/29/2021    HDL 57.00 11/29/2021   LDLCALC 79 11/29/2021   TRIG 107.0 11/29/2021   CHOLHDL 3 11/29/2021   Last thyroid functions Lab Results  Component Value Date   TSH 1.91 11/29/2021   T3TOTAL 94 10/22/2017   IMPRESSION AND PLAN:  #1 anxiety, major depressive disorder, PTSD. Ongoing struggles. Increase Effexor Exar to 225/day.  Continue BuSpar 10 mg 3 times daily and risperidone 0.5 mg nightly.  She has lorazepam 0.5 mg to use as needed but rarely uses this. I encouraged her to establish a counseling relationship with someone and I gave her information for [employer].  #2 hypertension, well controlled on amlodipine 10 mg a day and losartan 100 mg/day. Electrolytes and creatinine today.  3.  Hypercholesterolemia, doing well on a atorvastatin 20 mg a day. Lipid panel and hepatic panel today.  4.  Hypothyroidism, has been stable on levothyroxine 50 mcg a day. TSH today.  An After Visit Summary was printed and given to the patient.  FOLLOW UP: Return in about 6 months (around 11/30/2022) for routine chronic illness f/u.  Signed:  Crissie Sickles, MD           05/31/2022

## 2022-05-31 NOTE — Patient Instructions (Signed)
Psychologist:  Cheree Ditto [facility]) [phone]

## 2022-06-30 ENCOUNTER — Encounter: Payer: Self-pay | Admitting: Family Medicine

## 2022-06-30 ENCOUNTER — Ambulatory Visit (INDEPENDENT_AMBULATORY_CARE_PROVIDER_SITE_OTHER): Payer: Medicare Other | Admitting: Family Medicine

## 2022-06-30 VITALS — BP 114/73 | HR 77 | Temp 98.8°F | Ht 62.0 in | Wt 156.0 lb

## 2022-06-30 DIAGNOSIS — F3340 Major depressive disorder, recurrent, in remission, unspecified: Secondary | ICD-10-CM

## 2022-06-30 DIAGNOSIS — F411 Generalized anxiety disorder: Secondary | ICD-10-CM

## 2022-06-30 DIAGNOSIS — F4310 Post-traumatic stress disorder, unspecified: Secondary | ICD-10-CM

## 2022-06-30 MED ORDER — VENLAFAXINE HCL ER 225 MG PO TB24: ORAL_TABLET | ORAL | 3 refills | Status: DC

## 2022-06-30 NOTE — Patient Instructions (Signed)
Please call Dr.Stark's office to schedule for repeat colonoscopy. Their office contact number is [phone]

## 2022-06-30 NOTE — Progress Notes (Signed)
OFFICE VISIT  06/30/2022  CC:  Chief Complaint  Patient presents with   Anxiety   Depression    Patient is a 81 y.o. female who presents for 1 month follow-up anxiety and depression. A/P as of last visit: "1 anxiety, major depressive disorder, PTSD. Ongoing struggles. Increase Effexor XR to 225/day.  Continue BuSpar 10 mg 3 times daily and risperidone 0.5 mg nightly.  She has lorazepam 0.5 mg to use as needed but rarely uses this. I encouraged her to establish a counseling relationship with someone and I gave her information for [employer].   #2 hypertension, well controlled on amlodipine 10 mg a day and losartan 100 mg/day. Electrolytes and creatinine today.   3.  Hypercholesterolemia, doing well on a atorvastatin 20 mg a day. Lipid panel and hepatic panel today.   4.  Hypothyroidism, has been stable on levothyroxine 50 mcg a day. TSH today."  INTERIM HX: All labs normal last visit.  Kayna feels like she has been having a bit of an improvement in overall mood and anxiety levels over the last several days consistently. She has not contacted counselor yet. Still has periods of spontaneous crying spells.  Sounds like she does some breathing exercises to help these calm down. She is happy to report that she handled things pretty well recently when her son and fianc visited for several days.   Past Medical History:  Diagnosis Date   Anxiety and depression    with PTSD.  Dr. Johnsie Cancel ordered repeat MRI brain 2017 (no acute, no change from prior MR done in 2012) and referred pt to neuro for neuropsych testing but pt did not go.  Repeat MRI 10/2017 mild chronic microvasc isch change --stable compared to prior MRI.   Arthritis    Bilateral carpal tunnel syndrome    Cataracts, bilateral    Chronic renal insufficiency, stage II (mild) 2015   CrCl about 60 ml/min   Colitis    hx of   DDD (degenerative disc disease), lumbar 2016   L1-2 and L5-S1 fairly severe--intramuscular  injection of steroid and toradol by Dr. Antonietta Jewel 02/24/14 to calm down the pain   Enterocele 05/2016   Dx'd by Dr. Toney Rakes (GYN); he referred pt back to the MD at [facility] that did her prior bladder sling procedure.  No rectocele.   GERD (gastroesophageal reflux disease)    Goiter    Lobectomy (left) of thyroid for benign nodule.  Right lobe nodules followed by Dr. Chalmers Cater (FNA 2012 benign goiter) with ultrasounds and have been stable, most recently 01/10/13.     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   Hyperlipidemia    Hypertension    Lithium toxicity 2019   Lumbar spondylolysis 2016   L5: with grade I spondylolisthesis L5 on S1   Osteoporosis 2012; 2018   2012-"penia".  2018 "porosis"--alendronate started 12/2016.  Repeat DEXA 12/2018. DEXA 08/2021 T score -3.3   PFO (patent foramen ovale) echo 03/21/05   "hole in heart" saw Dr. Sharee Holster [phone] [facility]. hospital.  Plavix med mgmt.  Dr. Johnsie Cancel repeated echo w/bubble study 05/2016 and it showed NO PFO or ASD.   Subclinical hypothyroidism    Dr. Chalmers Cater has her on 25 mcg synthroid qd as of 01/2014   TIA (transient ischemic attack)    patient on plavix,  saw Dr. Isabell Jarvis at [facility].hospital in [place].  MRI  brain 05/2016: mild chronic small vessel dz, o/w normal.   Urinary tract infection    hx of   Vertigo     Past Surgical History:  Procedure Laterality Date   ABDOMINAL HYSTERECTOMY     BLADDER SUSPENSION     BREAST EXCISIONAL BIOPSY Right 1960   BREAST EXCISIONAL BIOPSY Left    BREAST SURGERY     "breast lumps removed"   COLONOSCOPY W/ POLYPECTOMY  04/2011; 05/29/16   +Diverticulosis and int hem.  Adenomatous polyp 2012.  No polyps on repeat TCS 05/2016--recall 5 yrs (05/2021)   DEXA  05/23/2011   2012 T-score -2.2. 12/2016 T-score -2.6.  08/2021 T score -3.3   DILATION AND CURETTAGE OF UTERUS     diverticulosis  05/2016   Noted on  colonoscopy   RECTOCELE REPAIR     purse string   THYROIDECTOMY     Left lobectomy   TOTAL KNEE ARTHROPLASTY  07/26/2012   Procedure: TOTAL KNEE ARTHROPLASTY;  Surgeon: Yvette Rack., MD;  Location: [facility];  Service: Orthopedics;  Laterality: Right;   TRANSTHORACIC ECHOCARDIOGRAM  05/24/2016   EF 60-65%, normal LV function.  No DD. No PFO or ASD (bubble study WAS done).    Outpatient Medications Prior to Visit  Medication Sig Dispense Refill   alendronate (FOSAMAX) 70 MG tablet Take 1 tablet (70 mg total) by mouth once a week.     amLODipine (NORVASC) 10 MG tablet Take 1 tablet (10 mg total) by mouth daily. 90 tablet 1   aspirin EC 81 MG tablet Take 1 tablet (81 mg total) by mouth daily. 30 tablet 0   atorvastatin (LIPITOR) 20 MG tablet Take 1 tablet (20 mg total) by mouth daily. 90 tablet 1   busPIRone (BUSPAR) 10 MG tablet Take 1 tablet (10 mg total) by mouth 3 (three) times daily. 270 tablet 1   levothyroxine (SYNTHROID) 50 MCG tablet Take 1 tablet (50 mcg total) by mouth daily before breakfast. 90 tablet 1   LORazepam (ATIVAN) 0.5 MG tablet 1-2 tabs po bid prn anxiety 30 tablet 1   losartan (COZAAR) 100 MG tablet Take 1 tablet (100 mg total) by mouth daily. 90 tablet 1   Multiple Vitamin (MV-ONE) CAPS Take 1 capsule by mouth daily.     Omega-3 Fatty Acids (FISH OIL) 500 MG CAPS Take 1 capsule by mouth daily.     risperiDONE (RISPERDAL M-TABS) 0.5 MG disintegrating tablet Take 1 tablet (0.5 mg total) by mouth at bedtime. 90 tablet 1   Vitamin D, Ergocalciferol, (DRISDOL) 1.25 MG (50000 UNIT) CAPS capsule Take by mouth.     Zoster Vaccine Adjuvanted [facility]) injection Inject into the muscle. 0.5 mL 0   Venlafaxine HCl 225 MG TB24 1 tab po qd 30 tablet 1   No facility-administered medications prior to visit.    Allergies  Allergen Reactions   Lamictal [Lamotrigine] Other (See Comments)   Lithium Other (See Comments)    toxicity   Phenylephrine Rash    Patient had allergic  reaction to dilation combo drop. Please dilate with Tropicamide 0.5% only with Fluress and/or Proparacaine    Sulfa Drugs Cross Reactors Rash    ROS As per HPI  PE:    06/30/2022   10:14 AM 05/31/2022    9:55 AM 11/29/2021   10:54 AM  Vitals with BMI  Height '5\' 2"'$  '5\' 2"'$  '5\' 2"'$   Weight 156 lbs 155 lbs 10 oz 155 lbs 6 oz  BMI 28.53 28.45 28.42  Systolic 314 388 875  Diastolic 73 84 71  Pulse 77 76 76     Physical Exam  Gen: Alert, well appearing.  Patient is oriented to person, place, time, and situation. AFFECT: pleasant, anxious.  Lucid thought and speech. No further exam today.  LABS:  Last CBC Lab Results  Component Value Date   WBC 6.3 03/17/2021   HGB 13.8 03/17/2021   HCT 42.2 03/17/2021   MCV 89.8 03/17/2021   MCH 29.3 07/28/2012   RDW 13.7 03/17/2021   PLT 207.0 79/72/8206   Last metabolic panel Lab Results  Component Value Date   GLUCOSE 77 05/31/2022   NA 142 05/31/2022   K 5.1 05/31/2022   CL 106 05/31/2022   CO2 27 05/31/2022   BUN 17 05/31/2022   CREATININE 1.00 05/31/2022   GFRNONAA 53 (L) 05/24/2016   CALCIUM 11.0 (H) 05/31/2022   PHOS 2.9 07/02/2015   PROT 7.0 05/31/2022   ALBUMIN 4.7 05/31/2022   BILITOT 1.0 05/31/2022   ALKPHOS 84 05/31/2022   AST 15 05/31/2022   ALT 14 05/31/2022   Last lipids Lab Results  Component Value Date   CHOL 181 05/31/2022   HDL 65.20 05/31/2022   LDLCALC 94 05/31/2022   TRIG 110.0 05/31/2022   CHOLHDL 3 05/31/2022   Last thyroid functions Lab Results  Component Value Date   TSH 1.70 05/31/2022   T3TOTAL 94 10/22/2017   IMPRESSION AND PLAN:  Anxiety, recurrent major depressive disorder, PTSD. Improving with recent increase in Effexor to 225 mg a day. Continue this.  Continue BuSpar 10 mg 3 times daily and risperidone 0.5 mg nightly.  She has lorazepam 0.5 mg to use as needed but rarely uses this. Once again I encouraged her to establish a counseling relationship with someone.  I gave her  information for [facility] at last visit.  An After Visit Summary was printed and given to the patient.  FOLLOW UP: Return in about 5 months (around 11/29/2022) for routine chronic illness f/u.  Signed:  Crissie Sickles, MD           06/30/2022

## 2022-07-03 ENCOUNTER — Other Ambulatory Visit: Payer: Self-pay | Admitting: Family Medicine

## 2022-07-04 ENCOUNTER — Telehealth: Payer: Self-pay

## 2022-07-04 NOTE — Telephone Encounter (Signed)
Carla Spencer (Key: [id number]) - [id number] Venlafaxine HCl ER '225MG'$  er tablets Status: PA Response - Approved Created: November 20th, 2023 Sent: November 20th, 2023

## 2022-07-10 DIAGNOSIS — L738 Other specified follicular disorders: Secondary | ICD-10-CM | POA: Diagnosis not present

## 2022-07-10 DIAGNOSIS — L821 Other seborrheic keratosis: Secondary | ICD-10-CM | POA: Diagnosis not present

## 2022-07-10 DIAGNOSIS — D1801 Hemangioma of skin and subcutaneous tissue: Secondary | ICD-10-CM | POA: Diagnosis not present

## 2022-07-10 DIAGNOSIS — Z85828 Personal history of other malignant neoplasm of skin: Secondary | ICD-10-CM | POA: Diagnosis not present

## 2022-07-10 DIAGNOSIS — B353 Tinea pedis: Secondary | ICD-10-CM | POA: Diagnosis not present

## 2022-07-10 DIAGNOSIS — L570 Actinic keratosis: Secondary | ICD-10-CM | POA: Diagnosis not present

## 2022-07-10 DIAGNOSIS — L218 Other seborrheic dermatitis: Secondary | ICD-10-CM | POA: Diagnosis not present

## 2022-07-21 ENCOUNTER — Other Ambulatory Visit (HOSPITAL_BASED_OUTPATIENT_CLINIC_OR_DEPARTMENT_OTHER): Payer: Self-pay

## 2022-07-21 MED ORDER — SHINGRIX 50 MCG/0.5ML IM SUSR
INTRAMUSCULAR | 0 refills | Status: DC
  Filled 2022-07-21: qty 0.5, 1d supply, fill #0

## 2022-08-22 ENCOUNTER — Other Ambulatory Visit: Payer: Self-pay | Admitting: Family Medicine

## 2022-08-22 DIAGNOSIS — Z1231 Encounter for screening mammogram for malignant neoplasm of breast: Secondary | ICD-10-CM

## 2022-09-18 DIAGNOSIS — E210 Primary hyperparathyroidism: Secondary | ICD-10-CM | POA: Diagnosis not present

## 2022-09-18 DIAGNOSIS — E890 Postprocedural hypothyroidism: Secondary | ICD-10-CM | POA: Diagnosis not present

## 2022-09-18 DIAGNOSIS — I10 Essential (primary) hypertension: Secondary | ICD-10-CM | POA: Diagnosis not present

## 2022-09-18 DIAGNOSIS — M810 Age-related osteoporosis without current pathological fracture: Secondary | ICD-10-CM | POA: Diagnosis not present

## 2022-09-18 DIAGNOSIS — E559 Vitamin D deficiency, unspecified: Secondary | ICD-10-CM | POA: Diagnosis not present

## 2022-10-12 ENCOUNTER — Ambulatory Visit
Admission: RE | Admit: 2022-10-12 | Discharge: 2022-10-12 | Disposition: A | Discharge status: Home / Self Care | Payer: Medicare Other | Source: Ambulatory Visit | Attending: Family Medicine | Admitting: Family Medicine

## 2022-10-12 DIAGNOSIS — Z1231 Encounter for screening mammogram for malignant neoplasm of breast: Secondary | ICD-10-CM | POA: Diagnosis not present

## 2022-11-29 ENCOUNTER — Encounter: Payer: Self-pay | Admitting: Family Medicine

## 2022-11-29 ENCOUNTER — Ambulatory Visit (INDEPENDENT_AMBULATORY_CARE_PROVIDER_SITE_OTHER): Payer: Medicare Other | Admitting: Family Medicine

## 2022-11-29 VITALS — BP 90/59 | HR 80 | Temp 97.6°F | Ht 62.0 in | Wt 157.6 lb

## 2022-11-29 DIAGNOSIS — N2889 Other specified disorders of kidney and ureter: Secondary | ICD-10-CM

## 2022-11-29 DIAGNOSIS — N1831 Chronic kidney disease, stage 3a: Secondary | ICD-10-CM

## 2022-11-29 DIAGNOSIS — F4310 Post-traumatic stress disorder, unspecified: Secondary | ICD-10-CM

## 2022-11-29 DIAGNOSIS — F411 Generalized anxiety disorder: Secondary | ICD-10-CM

## 2022-11-29 DIAGNOSIS — E039 Hypothyroidism, unspecified: Secondary | ICD-10-CM | POA: Diagnosis not present

## 2022-11-29 DIAGNOSIS — F3341 Major depressive disorder, recurrent, in partial remission: Secondary | ICD-10-CM

## 2022-11-29 DIAGNOSIS — I10 Essential (primary) hypertension: Secondary | ICD-10-CM | POA: Diagnosis not present

## 2022-11-29 DIAGNOSIS — M810 Age-related osteoporosis without current pathological fracture: Secondary | ICD-10-CM

## 2022-11-29 DIAGNOSIS — E7800 Pure hypercholesterolemia, unspecified: Secondary | ICD-10-CM

## 2022-11-29 LAB — COMPREHENSIVE METABOLIC PANEL
ALT: 13 U/L (ref 0–35)
AST: 14 U/L (ref 0–37)
Albumin: 4.4 g/dL (ref 3.5–5.2)
Alkaline Phosphatase: 91 U/L (ref 39–117)
BUN: 18 mg/dL (ref 6–23)
CO2: 27 mEq/L (ref 19–32)
Calcium: 10.3 mg/dL (ref 8.4–10.5)
Chloride: 104 mEq/L (ref 96–112)
Creatinine, Ser: 1.28 mg/dL — ABNORMAL HIGH (ref 0.40–1.20)
GFR: 39.89 mL/min — ABNORMAL LOW (ref >60.00)
Glucose, Bld: 85 mg/dL (ref 70–99)
Potassium: 4.5 mEq/L (ref 3.5–5.1)
Sodium: 141 mEq/L (ref 135–145)
Total Bilirubin: 0.7 mg/dL (ref 0.2–1.2)
Total Protein: 6.5 g/dL (ref 6.0–8.3)

## 2022-11-29 LAB — VITAMIN D 25 HYDROXY (VIT D DEFICIENCY, FRACTURES): VITD: 21.8 ng/mL — ABNORMAL LOW (ref 30.00–100.00)

## 2022-11-29 LAB — LIPID PANEL
Cholesterol: 177 mg/dL (ref 0–200)
HDL: 60 mg/dL (ref >39.00)
LDL Cholesterol: 88 mg/dL (ref 0–99)
NonHDL: 116.76
Total CHOL/HDL Ratio: 3
Triglycerides: 143 mg/dL (ref 0.0–149.0)
VLDL: 28.6 mg/dL (ref 0.0–40.0)

## 2022-11-29 LAB — TSH: TSH: 4.66 u[IU]/mL (ref 0.35–5.50)

## 2022-11-29 MED ORDER — AMLODIPINE BESYLATE 10 MG PO TABS: 10.0000 mg | ORAL_TABLET | Freq: Every day | ORAL | 1 refills | Status: DC

## 2022-11-29 MED ORDER — ATORVASTATIN CALCIUM 20 MG PO TABS: 20.0000 mg | ORAL_TABLET | Freq: Every day | ORAL | 1 refills | Status: DC

## 2022-11-29 MED ORDER — LOSARTAN POTASSIUM 100 MG PO TABS: 100.0000 mg | ORAL_TABLET | Freq: Every day | ORAL | 1 refills | Status: DC

## 2022-11-29 MED ORDER — LORAZEPAM 1 MG PO TABS: ORAL_TABLET | ORAL | 1 refills | Status: DC

## 2022-11-29 MED ORDER — BUSPIRONE HCL 10 MG PO TABS: 10.0000 mg | ORAL_TABLET | Freq: Three times a day (TID) | ORAL | 1 refills | Status: AC

## 2022-11-29 MED ORDER — ALENDRONATE SODIUM 70 MG PO TABS: 70.0000 mg | ORAL_TABLET | ORAL | 3 refills | Status: AC

## 2022-11-29 MED ORDER — LEVOTHYROXINE SODIUM 50 MCG PO TABS
50.0000 ug | ORAL_TABLET | Freq: Every day | ORAL | 1 refills | Status: DC
Start: 2022-11-29 — End: 2023-10-31

## 2022-11-29 MED ORDER — RISPERIDONE 0.5 MG PO TBDP: 0.5000 mg | ORAL_TABLET | Freq: Every day | ORAL | 1 refills | Status: DC

## 2022-11-29 NOTE — Patient Instructions (Addendum)
We have increased your lorazepam to  strength once a day.  Take 50,000 unit vitamin D tab daily  Take alendronate (fosamax)  once a week.  Take the medicine on an empty stomach. It should be taken as soon as you get out of bed in the morning and at least 30 minutes before any food, beverage, or other medicines. Food and beverages (eg, mineral water, coffee, tea, or juice) will decrease the amount of alendronate absorbed by the body. Waiting longer than 30 minutes will allow more of the drug to be absorbed. Medicines such as antacids, calcium, or vitamin supplements will also decrease the absorption of alendronate.  If you are using alendronate oral liquid, drink at least 2 ounces (a quarter of a cup) of water immediately after taking the medicine. This will allow the medicine to reach your intestines and be absorbed by the body more quickly.  Swallow the tablet whole with a full glass (6 to 8 ounces) of plain water. Do not suck or chew on the tablet because it may cause throat irritation.  The tablet should not be chewed, swallowed whole, or dissolved in the mouth to prevent the risk of mouth or throat irritation.  If you are taking alendronate effervescent tablet, dissolve it in 4 ounces of room temperature plain water only (not mineral water or flavored water). Wait at least 5 minutes after the effervescence stops and then stir the solution for 10 seconds and drink it.  Do not lie down for at least 30 minutes after taking alendronate and before having your first food for the day. This will help alendronate reach your stomach faster. It will also help prevent irritation to your esophagus.  It is important that you eat a well-balanced diet with adequate amounts of calcium and vitamin D (found in milk or other dairy products). However, do not take any foods, beverages, or calcium supplements within 30 minutes or longer after taking the alendronate. To do so may keep this medicine from working  properly.  Follow your dosing instructions given to you by your doctor closely. It may affect the way this medicine works if you do not. Do not stop using this medicine suddenly without asking your doctor.  Tell your doctor if you do weight-bearing exercises, smoke or drink excessively. Your doctor will need to take these into consideration in deciding your dose.

## 2022-11-29 NOTE — Progress Notes (Signed)
OFFICE VISIT  11/29/2022  CC:  Chief Complaint  Patient presents with   Medical Management of Chronic Issues    Patient is a 82 y.o. female who presents for 17-month follow-up hypertension, hypercholesterolemia, hypothyroidism, and anxiety and depression.   INTERIM HX: Overall says she feels no different emotionally and psychologically since I last saw her. Still struggles with crying spells that she feels like she cannot control.  She feels like a big trigger for her is when she has to go out of her house and into social situations.  She is afraid of being embarrassed by her anxious behavior and afraid of making others uncomfortable. She thinks Risperdal has definitely helped with her sleep and thinks that it helped a little bit with her anxiety and crying spells. She takes lorazepam 0.5 mg daily and has done so long-term.  She says she cannot really tell whether or not it helps with anxiety after she takes it.  It is not clear whether she has ever been compliant with taking alendronate. There has been various times when it has been prescribed for her but she definitely says she has not taken it in the last 6 months. Last DEXA in January of this year showed a T-score of -3.3.  Since I was not sure that she had taken alendronate my plan was to have her start this and see her endocrinologist.  However, she declined to do this. She takes a 50,000 unit vitamin D tab weekly and states lately she has started also take a D3 over-the-counter tab but she does not know what strength.  Ever since having a mammogram in February of this year she has right breast soreness in the upper outer quadrant.  She states they had trouble getting appropriate views and had to compress and recompress over and over.  Her mammogram showed no signs of malignancy.  ROS as above, plus--> no fevers, no CP, no SOB, no wheezing, no cough, no dizziness, no HAs, no rashes, no melena/hematochezia.  No polyuria or polydipsia.   No myalgias or arthralgias.  No focal weakness, paresthesias, or tremors.  No acute vision or hearing abnormalities.  No dysuria or unusual/new urinary urgency or frequency.  No recent changes in lower legs. No n/v/d or abd pain.  No palpitations.     Past Medical History:  Diagnosis Date   Anxiety and depression    with PTSD.  Dr. Eden Emms ordered repeat MRI brain 2017 (no acute, no change from prior MR done in 2012) and referred pt to neuro for neuropsych testing but pt did not go.  Repeat MRI 10/2017 mild chronic microvasc isch change --stable compared to prior MRI.   Arthritis    Bilateral carpal tunnel syndrome    Cataracts, bilateral    Chronic renal insufficiency, stage II (mild) 2015   CrCl about 60 ml/min   Colitis    hx of   DDD (degenerative disc disease), lumbar 2016   L1-2 and L5-S1 fairly severe--intramuscular injection of steroid and toradol by Dr. Raeanne Barry 02/24/14 to calm down the pain   Enterocele 05/2016   Dx'd by Dr. Lily Peer (GYN); he referred pt back to the MD at [facility] that did her prior bladder sling procedure.  No rectocele.   GERD (gastroesophageal reflux disease)    Goiter    Lobectomy (left) of thyroid for benign nodule.  Right lobe nodules followed by Dr. Talmage Nap (FNA 2012 benign goiter) with ultrasounds and have been stable, most recently 01/10/13.  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   Hyperlipidemia    Hypertension    Lithium toxicity 2019   Lumbar spondylolysis 2016   L5: with grade I spondylolisthesis L5 on S1   Osteoporosis 2012; 2018   2012-"penia".  2018 "porosis"--alendronate started 12/2016.  Repeat DEXA 12/2018. DEXA 08/2021 T score -3.3   PFO (patent foramen ovale) echo 03/21/05   "hole in heart" saw Dr. Melina Modena [phone] [facility]. hospital.  Plavix med mgmt.  Dr. Eden Emms repeated echo w/bubble study 05/2016 and it showed NO PFO or ASD.   Subclinical  hypothyroidism    Dr. Talmage Nap has her on 25 mcg synthroid qd as of 01/2014   TIA (transient ischemic attack)    patient on plavix,  saw Dr. Quentin Mulling at [facility].hospital in [place].  MRI brain 05/2016: mild chronic small vessel dz, o/w normal.   Urinary tract infection    hx of   Vertigo     Past Surgical History:  Procedure Laterality Date   ABDOMINAL HYSTERECTOMY     BLADDER SUSPENSION     BREAST EXCISIONAL BIOPSY Right 1960   BREAST EXCISIONAL BIOPSY Left    BREAST SURGERY     "breast lumps removed"   COLONOSCOPY W/ POLYPECTOMY  04/2011; 05/29/16   +Diverticulosis and int hem.  Adenomatous polyp 2012.  No polyps on repeat TCS 05/2016--recall 5 yrs (05/2021)   DEXA  05/23/2011   2012 T-score -2.2. 12/2016 T-score -2.6.  08/2021 T score -3.3   DILATION AND CURETTAGE OF UTERUS     diverticulosis  05/2016   Noted on colonoscopy   RECTOCELE REPAIR     purse string   THYROIDECTOMY     Left lobectomy   TOTAL KNEE ARTHROPLASTY  07/26/2012   Procedure: TOTAL KNEE ARTHROPLASTY;  Surgeon: Thera Flake., MD;  Location: [facility] OR;  Service: Orthopedics;  Laterality: Right;   TRANSTHORACIC ECHOCARDIOGRAM  05/24/2016   EF 60-65%, normal LV function.  No DD. No PFO or ASD (bubble study WAS done).    Outpatient Medications Prior to Visit  Medication Sig Dispense Refill   aspirin EC 81 MG tablet Take 1 tablet (81 mg total) by mouth daily. 30 tablet 0   Cholecalciferol (VITAMIN D-3 PO) Take by mouth daily.     Multiple Vitamin (MV-ONE) CAPS Take 1 capsule by mouth daily.     Omega-3 Fatty Acids (FISH OIL) 500 MG CAPS Take 1 capsule by mouth daily.     Venlafaxine HCl 225 MG TB24 1 tab po qd 90 tablet 3   Vitamin D, Ergocalciferol, (DRISDOL) 1.25 MG (50000 UNIT) CAPS capsule Take by mouth.     LORazepam (ATIVAN) 0.5 MG tablet 1-2 tabs po bid prn anxiety 30 tablet 1   alendronate (FOSAMAX) 70 MG tablet Take 1 tablet (70 mg total) by mouth once a week. (Patient not taking: Reported on 11/29/2022)      amLODipine (NORVASC) 10 MG tablet Take 1 tablet (10 mg total) by mouth daily. 90 tablet 1   atorvastatin (LIPITOR) 20 MG tablet Take 1 tablet (20 mg total) by mouth daily. 90 tablet 1   busPIRone (BUSPAR) 10 MG tablet Take 1 tablet (10 mg total) by mouth 3 (three) times daily. 270 tablet 1   levothyroxine (SYNTHROID) 50 MCG tablet TAKE 1 TABLET BY MOUTH ONCE DAILY BEFORE  BREAKFAST 90 tablet 1   losartan (COZAAR)  100 MG tablet Take 1 tablet (100 mg total) by mouth daily. 90 tablet 1   risperiDONE (RISPERDAL M-TABS) 0.5 MG disintegrating tablet Take 1 tablet (0.5 mg total) by mouth at bedtime. 90 tablet 1   Zoster Vaccine Adjuvanted [facility]) injection Inject into the muscle. (Patient not taking: Reported on 11/29/2022) 0.5 mL 0   Zoster Vaccine Adjuvanted [facility]) injection Inject into the muscle. (Patient not taking: Reported on 11/29/2022) 0.5 mL 0   No facility-administered medications prior to visit.    Allergies  Allergen Reactions   Lamictal [Lamotrigine] Other (See Comments)   Lithium Other (See Comments)    toxicity   Phenylephrine Rash    Patient had allergic reaction to dilation combo drop. Please dilate with Tropicamide 0.5% only with Fluress and/or Proparacaine    Sulfa Drugs Cross Reactors Rash    Review of Systems As per HPI  PE:    11/29/2022   10:16 AM 06/30/2022   10:14 AM 05/31/2022    9:55 AM  Vitals with BMI  Height     Weight 157 lbs 10 oz 156 lbs 155 lbs 10 oz  BMI 28.82 28.53 28.45  Systolic 90 114 119  Diastolic 59 73 84  Pulse 80 77 76     Physical Exam Exam chaperoned by Emi Holes, CMA.  Gen: Alert, well appearing.  Patient is oriented to person, place, time, and situation. AFFECT: pleasant, lucid thought and speech. Right breast without bruising, nodularity, or mass. Mild focal tenderness in the upper outer quadrant of right breast.  LABS:  Last CBC Lab Results  Component Value Date   WBC 6.3 03/17/2021   HGB  13.8 03/17/2021   HCT 42.2 03/17/2021   MCV 89.8 03/17/2021   MCH 29.3 07/28/2012   RDW 13.7 03/17/2021   PLT 207.0 03/17/2021   Last metabolic panel Lab Results  Component Value Date   GLUCOSE 77 05/31/2022   NA 142 05/31/2022   K 5.1 05/31/2022   CL 106 05/31/2022   CO2 27 05/31/2022   BUN 17 05/31/2022   CREATININE 1.00 05/31/2022   GFRNONAA 53 (L) 05/24/2016   CALCIUM 11.0 (H) 05/31/2022   PHOS 2.9 07/02/2015   PROT 7.0 05/31/2022   ALBUMIN 4.7 05/31/2022   BILITOT 1.0 05/31/2022   ALKPHOS 84 05/31/2022   AST 15 05/31/2022   ALT 14 05/31/2022   Last lipids Lab Results  Component Value Date   CHOL 181 05/31/2022   HDL 65.20 05/31/2022   LDLCALC 94 05/31/2022   TRIG 110.0 05/31/2022   CHOLHDL 3 05/31/2022   Last thyroid functions Lab Results  Component Value Date   TSH 1.70 05/31/2022   T3TOTAL 94 10/22/2017   Last vitamin D Lab Results  Component Value Date   VD25OH 31.56 05/31/2022   IMPRESSION AND PLAN:  #1 Anxiety, recurrent major depressive disorder, PTSD.  Fairly stable.  We discussed possible increase in Risperdal to 1 mg nightly but she wants to hold off for now. We did decide to increase her lorazepam to a 1 mg dose and she will take it once every day. She will continue venlafaxine extended release 225 mg daily.  She will also continue BuSpar 10 mg 3 times a day.  2.  Hypertension.  Well-controlled on losartan 100 mg a day and amlodipine 10 mg a day. Electrolytes and creatinine today.  3.  Hypercholesterolemia, doing well on atorvastatin 20 mg a day. Lipid panel and hepatic panel today.  4.  Hypothyroidism. Monitor  TSH today.  Continue levothyroxine 50 mcg once a day at this time.  5.  Vitamin D deficiency.  Continue 50,000 unit vitamin D tab weekly. Check vitamin D level today.  6.  Osteoporosis. It is not clear whether she has taken alendronate for any extended period of time in the past, despite it being prescribed multiple times. Due  to this lack of clarity we have chosen to continue with the plan of just getting started on alendronate and taking it consistently, rather than refer to endocrinology for management at this time. Checking calcium and vitamin D today.  An After Visit Summary was printed and given to the patient.  FOLLOW UP: Return in about 4 weeks (around 12/27/2022) for f/u anxiety.  Signed:  Santiago Bumpers, MD           11/29/2022

## 2022-11-30 ENCOUNTER — Other Ambulatory Visit: Payer: Self-pay

## 2022-11-30 ENCOUNTER — Encounter: Payer: Self-pay | Admitting: Family Medicine

## 2022-11-30 DIAGNOSIS — N179 Acute kidney failure, unspecified: Secondary | ICD-10-CM

## 2022-11-30 MED ORDER — VITAMIN D (ERGOCALCIFEROL) 1.25 MG (50000 UNIT) PO CAPS: 50000.0000 [IU] | ORAL_CAPSULE | ORAL | 1 refills | Status: DC

## 2022-11-30 NOTE — Telephone Encounter (Signed)
Recommendations given and pt scheduled for labs

## 2022-12-12 ENCOUNTER — Other Ambulatory Visit (INDEPENDENT_AMBULATORY_CARE_PROVIDER_SITE_OTHER): Payer: Medicare Other

## 2022-12-12 DIAGNOSIS — N179 Acute kidney failure, unspecified: Secondary | ICD-10-CM | POA: Diagnosis not present

## 2022-12-12 LAB — BASIC METABOLIC PANEL
BUN: 14 mg/dL (ref 6–23)
CO2: 27 mEq/L (ref 19–32)
Calcium: 10.5 mg/dL (ref 8.4–10.5)
Chloride: 107 mEq/L (ref 96–112)
Creatinine, Ser: 1.06 mg/dL (ref 0.40–1.20)
GFR: 50 mL/min — ABNORMAL LOW (ref >60.00)
Glucose, Bld: 81 mg/dL (ref 70–99)
Potassium: 4.8 mEq/L (ref 3.5–5.1)
Sodium: 142 mEq/L (ref 135–145)

## 2022-12-13 ENCOUNTER — Encounter: Payer: Self-pay | Admitting: Family Medicine

## 2022-12-29 ENCOUNTER — Ambulatory Visit (INDEPENDENT_AMBULATORY_CARE_PROVIDER_SITE_OTHER): Payer: Medicare Other | Admitting: Family Medicine

## 2022-12-29 ENCOUNTER — Encounter: Payer: Self-pay | Admitting: Family Medicine

## 2022-12-29 VITALS — BP 130/78 | HR 88 | Wt 156.4 lb

## 2022-12-29 DIAGNOSIS — F4310 Post-traumatic stress disorder, unspecified: Secondary | ICD-10-CM | POA: Diagnosis not present

## 2022-12-29 DIAGNOSIS — N179 Acute kidney failure, unspecified: Secondary | ICD-10-CM

## 2022-12-29 DIAGNOSIS — F331 Major depressive disorder, recurrent, moderate: Secondary | ICD-10-CM

## 2022-12-29 DIAGNOSIS — N1831 Chronic kidney disease, stage 3a: Secondary | ICD-10-CM

## 2022-12-29 DIAGNOSIS — I10 Essential (primary) hypertension: Secondary | ICD-10-CM

## 2022-12-29 MED ORDER — RISPERIDONE 1 MG PO TBDP: 1.0000 mg | ORAL_TABLET | Freq: Every day | ORAL | 0 refills | Status: DC
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
## 2022-12-29 NOTE — Patient Instructions (Signed)
Call [facility] to arrange colonoscopy:  [phone]

## 2023-01-29 NOTE — Patient Instructions (Signed)

## 2023-01-30 ENCOUNTER — Encounter: Payer: Self-pay | Admitting: Family Medicine

## 2023-01-30 ENCOUNTER — Ambulatory Visit (INDEPENDENT_AMBULATORY_CARE_PROVIDER_SITE_OTHER): Payer: Medicare Other | Admitting: Family Medicine

## 2023-01-30 VITALS — BP 128/79 | HR 90 | Wt 156.0 lb

## 2023-01-30 DIAGNOSIS — F3341 Major depressive disorder, recurrent, in partial remission: Secondary | ICD-10-CM | POA: Diagnosis not present

## 2023-01-30 DIAGNOSIS — F4310 Post-traumatic stress disorder, unspecified: Secondary | ICD-10-CM

## 2023-01-30 DIAGNOSIS — G8929 Other chronic pain: Secondary | ICD-10-CM

## 2023-01-30 DIAGNOSIS — F411 Generalized anxiety disorder: Secondary | ICD-10-CM

## 2023-01-30 DIAGNOSIS — M25562 Pain in left knee: Secondary | ICD-10-CM

## 2023-01-30 MED ORDER — RISPERIDONE 1 MG PO TBDP: 1.0000 mg | ORAL_TABLET | Freq: Every day | ORAL | 1 refills | Status: DC

## 2023-01-30 NOTE — Progress Notes (Signed)
OFFICE VISIT  02/07/2023  CC:  Chief Complaint  Patient presents with   Anxiety   Depression    Patient is a 82 y.o. female who presents for 1 month follow-up anxiety and depression/PTSD. A/P as of last visit: "#1 PTSD, recurrent major depressive disorder, GAD. All pretty active and unfortunately we have not had much improvement in getting her better with medications. Decided to increase Risperdal to 1 mg nightly.  Continue Effexor XR 225 mg a day, lorazepam 1 mg daily as needed, and BuSpar 10 mg 3 times daily.   #2 hypertension, this has been well-controlled on losartan and amlodipine. Is still okay after getting off losartan due to a bump in her serum creatinine recently. Her creatinine came back up after getting off medication. Continue on amlodipine 10 mg a day only."  INTERIM HX: He feels a little bit improved.  Sleep is better and mood is slightly improved.  Less irritable and less severe crying spells. She describes gradual progression of chronic left knee pain.  She feels like it does swell a mild amount sometimes.  No heat or erythema. History of severe osteoarthritis in the right knee, is status post right TKA in 2013 by Dr. Madelon Lips.   PMP AWARE reviewed today: most recent rx for lorazepam 1 mg was filled 11/29/2022, # 30, rx by me. No red flags.  Past Medical History:  Diagnosis Date   Anxiety and depression    with PTSD.  Dr. Eden Emms ordered repeat MRI brain 2017 (no acute, no change from prior MR done in 2012) and referred pt to neuro for neuropsych testing but pt did not go.  Repeat MRI 10/2017 mild chronic microvasc isch change --stable compared to prior MRI.   Arthritis    Bilateral carpal tunnel syndrome    Cataracts, bilateral    Chronic renal insufficiency, stage III (moderate) (HCC)    Colitis    hx of   DDD (degenerative disc disease), lumbar 2016   L1-2 and L5-S1 fairly severe--intramuscular injection of steroid and toradol by Dr. Raeanne Barry 02/24/14 to calm  down the pain   Enterocele 05/2016   Dx'd by Dr. Lily Peer (GYN); he referred pt back to the MD at [facility] that did her prior bladder sling procedure.  No rectocele.   GERD (gastroesophageal reflux disease)    Goiter    Lobectomy (left) of thyroid for benign nodule.  Right lobe nodules followed by Dr. Talmage Nap (FNA 2012 benign goiter) with ultrasounds and have been stable, most recently 01/10/13.     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   Hyperlipidemia    Hypertension    Lithium toxicity 2019   Lumbar spondylolysis 2016   L5: with grade I spondylolisthesis L5 on S1   Osteoporosis 2012; 2018   2012-"penia".  2018 "porosis"--alendronate started 12/2016.  Repeat DEXA 12/2018. DEXA 08/2021 T score -3.3   PFO (patent foramen ovale) echo 03/21/05   "hole in heart" saw Dr. Melina Modena [phone] [facility]. hospital.  Plavix med mgmt.  Dr. Eden Emms repeated echo w/bubble study 05/2016 and it showed NO PFO or ASD.   Subclinical hypothyroidism    Dr. Talmage Nap has her on 25 mcg synthroid qd as of 01/2014   TIA (transient ischemic attack)    patient on plavix,  saw Dr. Quentin Mulling at [facility].hospital in [place].  MRI brain 05/2016: mild chronic small vessel dz, o/w normal.  Urinary tract infection    hx of   Vertigo    Vitamin D deficiency     Past Surgical History:  Procedure Laterality Date   ABDOMINAL HYSTERECTOMY     BLADDER SUSPENSION     BREAST EXCISIONAL BIOPSY Right 1960   BREAST EXCISIONAL BIOPSY Left    BREAST SURGERY     "breast lumps removed"   COLONOSCOPY W/ POLYPECTOMY  04/2011; 05/29/16   +Diverticulosis and int hem.  Adenomatous polyp 2012.  No polyps on repeat TCS 05/2016--recall 5 yrs (05/2021)   DEXA  05/23/2011   2012 T-score -2.2. 12/2016 T-score -2.6.  08/2021 T score -3.3   DILATION AND CURETTAGE OF UTERUS     diverticulosis  05/2016   Noted on colonoscopy   RECTOCELE REPAIR     purse  string   THYROIDECTOMY     Left lobectomy   TOTAL KNEE ARTHROPLASTY  07/26/2012   Procedure: TOTAL KNEE ARTHROPLASTY;  Surgeon: Thera Flake., MD;  Location: [facility] OR;  Service: Orthopedics;  Laterality: Right;   TRANSTHORACIC ECHOCARDIOGRAM  05/24/2016   EF 60-65%, normal LV function.  No DD. No PFO or ASD (bubble study WAS done).    Outpatient Medications Prior to Visit  Medication Sig Dispense Refill   alendronate (FOSAMAX) 70 MG tablet Take 1 tablet (70 mg total) by mouth once a week. 12 tablet 3   amLODipine (NORVASC) 10 MG tablet Take 1 tablet (10 mg total) by mouth daily. 90 tablet 1   aspirin EC 81 MG tablet Take 1 tablet (81 mg total) by mouth daily. 30 tablet 0   atorvastatin (LIPITOR) 20 MG tablet Take 1 tablet (20 mg total) by mouth daily. 90 tablet 1   busPIRone (BUSPAR) 10 MG tablet Take 1 tablet (10 mg total) by mouth 3 (three) times daily. 270 tablet 1   Cholecalciferol (VITAMIN D-3 PO) Take by mouth daily.     levothyroxine (SYNTHROID) 50 MCG tablet Take 1 tablet (50 mcg total) by mouth daily before breakfast. 90 tablet 1   LORazepam (ATIVAN) 1 MG tablet 1 tab po qd 30 tablet 1   Multiple Vitamin (MV-ONE) CAPS Take 1 capsule by mouth daily.     Omega-3 Fatty Acids (FISH OIL) 500 MG CAPS Take 1 capsule by mouth daily.     Venlafaxine HCl 225 MG TB24 1 tab po qd 90 tablet 3   Vitamin D, Ergocalciferol, (DRISDOL) 1.25 MG (50000 UNIT) CAPS capsule Take 1 capsule (50,000 Units total) by mouth 2 (two) times a week. 90 capsule 1   risperiDONE (RISPERDAL M-TABS) 1 MG disintegrating tablet Take 1 tablet (1 mg total) by mouth at bedtime. 30 tablet 0   No facility-administered medications prior to visit.    Allergies  Allergen Reactions   Lamictal [Lamotrigine] Other (See Comments)   Lithium Other (See Comments)    toxicity   Phenylephrine Rash    Patient had allergic reaction to dilation combo drop. Please dilate with Tropicamide 0.5% only with Fluress and/or Proparacaine     Sulfa Drugs Cross Reactors Rash    Review of Systems As per HPI  PE:    01/30/2023    9:43 AM 12/29/2022   10:05 AM 11/29/2022   10:16 AM  Vitals with BMI  Height   5\' 2"   Weight 156 lbs 156 lbs 6 oz 157 lbs 10 oz  BMI  28.6 28.82  Systolic 128 130 90  Diastolic 79 78 59  Pulse 90 88 80  Physical Exam  Gen: Alert, well appearing.  Patient is oriented to person, place, time, and situation. Left knee: Active and passive flexion fully intact.  No instability.  No erythema or tenderness.  No obvious effusion.  McMurray's negative.  LABS:  Last metabolic panel Lab Results  Component Value Date   GLUCOSE 81 12/12/2022   NA 142 12/12/2022   K 4.8 12/12/2022   CL 107 12/12/2022   CO2 27 12/12/2022   BUN 14 12/12/2022   CREATININE 1.06 12/12/2022   GFRNONAA 53 (L) 05/24/2016   CALCIUM 10.5 12/12/2022   PHOS 2.9 07/02/2015   PROT 6.5 11/29/2022   ALBUMIN 4.4 11/29/2022   BILITOT 0.7 11/29/2022   ALKPHOS 91 11/29/2022   AST 14 11/29/2022   ALT 13 11/29/2022   IMPRESSION AND PLAN:  #1 PTSD, recurrent major depressive disorder, GAD. All pretty active but some significant improvement has been seen with recent increase in Risperdal to 1 mg nightly dosing.  Will continue this.  Continue Effexor XR 225 mg a day, lorazepam 1 mg daily as needed, and BuSpar 10 mg 3 times daily.  #2 chronic left knee pain. Suspect osteoarthritis (bedside [facility] ultrasound today showed diffuse degenerative changes without effusion.) In the future we will get radiographs of the knee. She states it is not bothering her enough right now to consider injection or PT or orthopedics referral.  An After Visit Summary was printed and given to the patient.  FOLLOW UP: Return in about 3 months (around 05/02/2023) for routine chronic illness f/u.  Signed:  Santiago Bumpers, MD           02/07/2023

## 2023-03-14 ENCOUNTER — Encounter (INDEPENDENT_AMBULATORY_CARE_PROVIDER_SITE_OTHER): Payer: Self-pay

## 2023-03-29 ENCOUNTER — Encounter (INDEPENDENT_AMBULATORY_CARE_PROVIDER_SITE_OTHER): Payer: Self-pay

## 2023-04-11 ENCOUNTER — Ambulatory Visit (INDEPENDENT_AMBULATORY_CARE_PROVIDER_SITE_OTHER): Payer: Medicare Other

## 2023-04-11 VITALS — Wt 156.0 lb

## 2023-04-11 DIAGNOSIS — Z0000 Encounter for general adult medical examination without abnormal findings: Secondary | ICD-10-CM | POA: Diagnosis not present

## 2023-04-11 NOTE — Patient Instructions (Signed)
Carla Spencer , Thank you for taking time to come for your Medicare Wellness Visit. I appreciate your ongoing commitment to your health goals. Please review the following plan we discussed and let me know if I can assist you in the future.   Referrals/Orders/Follow-Ups/Clinician Recommendations: stay healthy and well   This is a list of the screening recommended for you and due dates:  Health Maintenance  Topic Date Due   Colon Cancer Screening  05/29/2021   COVID-19 Vaccine (4 - 2023-24 season) 04/14/2022   Medicare Annual Wellness Visit  03/30/2023   Flu Shot  03/15/2023   Mammogram  10/12/2023   DTaP/Tdap/Td vaccine (2 - Td or Tdap) 04/19/2025   Pneumonia Vaccine  Completed   DEXA scan (bone density measurement)  Completed   Zoster (Shingles) Vaccine  Completed   HPV Vaccine  Aged Out   Hepatitis C Screening  Discontinued    Advanced directives: (In Chart) A copy of your advanced directives are scanned into your chart should your provider ever need it.  Next Medicare Annual Wellness Visit scheduled for next year: Yes

## 2023-04-11 NOTE — Progress Notes (Addendum)
Subjective:   [facility] is a 82 y.o. female who presents for Medicare Annual (Subsequent) preventive examination.  Visit Complete: Virtual  I connected with  [facility] on 04/23/23 by a audio enabled telemedicine application and verified that I am speaking with the correct person using two identifiers.  Patient Location: Home  Provider Location: Home Office  I discussed the limitations of evaluation and management by telemedicine. The patient expressed understanding and agreed to proceed.  Vital Signs: Unable to obtain new vitals due to this being a telehealth visit.     Review of Systems     Cardiac Risk Factors include: advanced age (>32men, >44 women);hypertension     Objective:    Today's Vitals   04/11/23 1255  Weight: 156 lb (70.8 kg)   Body mass index is 28.53 kg/m.     04/11/2023   12:59 PM 03/29/2022    1:10 PM 03/02/2021   11:27 AM 07/26/2012    6:12 AM 07/19/2012   10:20 AM  Advanced Directives  Does Patient Have a Medical Advance Directive? Yes Yes Yes  Patient does not have advance directive  Type of Advance Directive Healthcare Power of [facility];Living will Healthcare Power of [facility];Living will    Does patient want to make changes to medical advance directive? No - Patient declined      Copy of Healthcare Power of Attorney in Chart? Yes - validated most recent copy scanned in chart (See row information) No - copy requested Yes - validated most recent copy scanned in chart (See row information)    Pre-existing out of facility DNR order (yellow form or pink MOST form)    No     Current Medications (verified) Outpatient Encounter Medications as of 04/11/2023  Medication Sig   alendronate (FOSAMAX) 70 MG tablet Take 1 tablet (70 mg total) by mouth once a week.   amLODipine (NORVASC) 10 MG tablet Take 1 tablet (10 mg total) by mouth daily.   aspirin EC 81 MG tablet Take 1 tablet (81 mg total) by mouth daily.    atorvastatin (LIPITOR) 20 MG tablet Take 1 tablet (20 mg total) by mouth daily.   busPIRone (BUSPAR) 10 MG tablet Take 1 tablet (10 mg total) by mouth 3 (three) times daily.   Cholecalciferol (VITAMIN D-3 PO) Take by mouth daily.   levothyroxine (SYNTHROID) 50 MCG tablet Take 1 tablet (50 mcg total) by mouth daily before breakfast.   LORazepam (ATIVAN) 1 MG tablet 1 tab po qd   Multiple Vitamin (MV-ONE) CAPS Take 1 capsule by mouth daily.   Omega-3 Fatty Acids (FISH OIL) 500 MG CAPS Take 1 capsule by mouth daily.   risperiDONE (RISPERDAL M-TABS) 1 MG disintegrating tablet Take 1 tablet (1 mg total) by mouth at bedtime.   Venlafaxine HCl 225 MG TB24 1 tab po qd   Vitamin D, Ergocalciferol, (DRISDOL) 1.25 MG (50000 UNIT) CAPS capsule Take 1 capsule (50,000 Units total) by mouth 2 (two) times a week.   No facility-administered encounter medications on file as of 04/11/2023.    Allergies (verified) Lamictal [lamotrigine], Lithium, Phenylephrine, and Sulfa drugs cross reactors   History: Past Medical History:  Diagnosis Date   Anxiety and depression    with PTSD.  Dr. Eden [facility] ordered repeat MRI brain 2017 (no acute, no change from prior MR done in 2012) and referred pt to neuro for neuropsych testing but pt did not go.  Repeat MRI 10/2017 mild chronic microvasc isch change --  stable compared to prior MRI.   Arthritis    Bilateral carpal tunnel syndrome    Cataracts, bilateral    Chronic renal insufficiency, stage III (moderate) (HCC)    Colitis    hx of   DDD (degenerative disc disease), lumbar 2016   L1-2 and L5-S1 fairly severe--intramuscular injection of steroid and toradol by Dr. Raeanne [facility] 02/24/14 to calm down the pain   Enterocele 05/2016   Dx'd by Dr. Lily [facility] (GYN); he referred pt back to the MD at [facility] that did her prior bladder sling procedure.  No rectocele.   GERD (gastroesophageal reflux disease)    Goiter    Lobectomy (left) of thyroid for benign nodule.  Right lobe nodules  followed by [facility] (FNA 2012 benign goiter) with ultrasounds and have been stable, most recently 01/10/13.     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   Hyperlipidemia    Hypertension    Lithium toxicity 2019   Lumbar spondylolysis 2016   L5: with grade I spondylolisthesis L5 on S1   Osteoporosis 2012; 2018   2012-"penia".  2018 "porosis"--alendronate started 12/2016.  Repeat DEXA 12/2018. DEXA 08/2021 T score -3.3   PFO (patent foramen ovale) echo 03/21/05   "hole in heart" saw [facility] [phone] [facility]. hospital.  Plavix med mgmt.  Dr. Eden Spencer repeated echo w/bubble study 05/2016 and it showed NO PFO or ASD.   Subclinical hypothyroidism    [facility] has her on 25 mcg synthroid qd as of 01/2014   TIA (transient ischemic attack)    patient on plavix,  saw Dr. Quentin [facility] at [facility].hospital in [place].  MRI brain 05/2016: mild chronic small vessel dz, o/w normal.   Urinary tract infection    hx of   Vertigo    Vitamin D deficiency    Past Surgical History:  Procedure Laterality Date   ABDOMINAL HYSTERECTOMY     BLADDER SUSPENSION     BREAST EXCISIONAL BIOPSY Right 1960   BREAST EXCISIONAL BIOPSY Left    BREAST SURGERY     "breast lumps removed"   COLONOSCOPY W/ POLYPECTOMY  04/2011; 05/29/16   +Diverticulosis and int hem.  Adenomatous polyp 2012.  No polyps on repeat TCS 05/2016--recall 5 yrs (05/2021)   DEXA  05/23/2011   2012 T-score -2.2. 12/2016 T-score -2.6.  08/2021 T score -3.3   DILATION AND CURETTAGE OF UTERUS     diverticulosis  05/2016   Noted on colonoscopy   RECTOCELE REPAIR     purse string   THYROIDECTOMY     Left lobectomy   TOTAL KNEE ARTHROPLASTY  07/26/2012   Procedure: TOTAL KNEE ARTHROPLASTY;  Surgeon: [facility]., MD;  Location: [facility] OR;  Service: Orthopedics;  Laterality: Right;   TRANSTHORACIC ECHOCARDIOGRAM  05/24/2016   EF 60-65%, normal LV function.   No DD. No PFO or ASD (bubble study WAS done).   Family History  Problem Relation Age of Onset   Heart disease Mother    Stomach cancer Father    Cancer Father    Colon polyps Daughter    Diabetes Cousin    Colon cancer Neg Hx    Social History   Socioeconomic History   Marital status: Married    Spouse name: Not on file   Number of children: 4   Years of education: Not on file   Highest education level:  Not on file  Occupational History   Occupation: Retired  Tobacco Use   Smoking status: Former   Smokeless tobacco: Never   Tobacco comments:    smoked 1/4 of pack from 1961-1967  Vaping Use   Vaping status: Never Used  Substance and Sexual Activity   Alcohol use: No   Drug use: No   Sexual activity: Not Currently  Other Topics Concern   Not on file  Social History Narrative   Married, 4 children.   Orig from [place], relocated [facility] 2012.   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  Retired in her 44s.   Volunteered a lot.   Tob: minimal, quit 1969.   Alc: none   Social Determinants of Health   Financial Resource Strain: Low Risk  (04/11/2023)   Overall Financial Resource Strain (CARDIA)    Difficulty of Paying Living Expenses: Not hard at all  Food Insecurity: No Food Insecurity (04/11/2023)   Hunger Vital Sign    Worried About Running Out of Food in the Last Year: Never true    Ran Out of Food in the Last Year: Never true  Transportation Needs: No Transportation Needs (04/11/2023)   PRAPARE - Administrator, Civil Service (Medical): No    Lack of Transportation (Non-Medical): No  Physical Activity: Inactive (04/11/2023)   Exercise Vital Sign    Days of Exercise per Week: 0 days    Minutes of Exercise per Session: 0 min  Stress: No Stress Concern Present (04/11/2023)   [employer] of Occupational Health - Occupational Stress Questionnaire    Feeling of Stress : Not at all  Social Connections: Moderately Isolated (04/11/2023)    Social Connection and Isolation Panel [NHANES]    Frequency of Communication with Friends and Family: More than three times a week    Frequency of Social Gatherings with Friends and Family: Once a week    Attends Religious Services: Never    Database administrator or Organizations: No    Attends Engineer, structural: Never    Marital Status: Married    Tobacco Counseling Counseling given: Not Answered Tobacco comments: smoked 1/4 of pack from 1961-1967   Clinical Intake:  Pre-visit preparation completed: Yes  Pain : No/denies pain     BMI - recorded: 28.53 Nutritional Status: BMI 25 -29 Overweight Nutritional Risks: None Diabetes: No  How often do you need to have someone help you when you read instructions, pamphlets, or other written materials from your doctor or pharmacy?: 1 - Never  Interpreter Needed?: No  Information entered by :: Lanier Ensign, LPN   Activities of Daily Living    04/11/2023   12:57 PM  In your present state of health, do you have any difficulty performing the following activities:  Hearing? 0  Vision? 0  Difficulty concentrating or making decisions? 0  Walking or climbing stairs? 0  Dressing or bathing? 0  Doing errands, shopping? 0  Preparing Food and eating ? N  Using the Toilet? N  In the past six months, have you accidently leaked urine? N  Do you have problems with loss of bowel control? N  Managing your Medications? N  Managing your Finances? N  Housekeeping or managing your Housekeeping? N    Patient Care Team: Jeoffrey Massed, MD as PCP - General (Family Medicine) Dorisann Frames, MD as Consulting Physician (Endocrinology) Romero Belling, MD as Consulting Physician (Physical Medicine and Rehabilitation) Thresa Ross, MD as Consulting  Physician (Psychiatry) Meryl Dare, MD as Consulting Physician (Gastroenterology) Frederico Hamman, MD as Consulting Physician (Orthopedic Surgery) Berkley Harvey, MD as  Consulting Physician (Ophthalmology) Wendall Stade, MD as Consulting Physician (Cardiology) Ok Edwards, MD (Inactive) as Consulting Physician (Gynecology)  Indicate any recent Medical Services you may have received from other than [facility] providers in the past year (date may be approximate).     Assessment:   This is a routine wellness examination for [facility].  Hearing/Vision screen Hearing Screening - Comments:: Pt denies any hearing issues  Vision Screening - Comments:: Pt follows up with Dr Sondra Barges for annual eye exams   Dietary issues and exercise activities discussed:     Goals Addressed   None    Depression Screen    04/11/2023   12:58 PM 01/30/2023    9:47 AM 12/29/2022   10:20 AM 11/29/2022   10:22 AM 06/30/2022   10:17 AM 05/31/2022   10:34 AM 03/29/2022    1:09 PM  PHQ 2/9 Scores  PHQ - 2 Score 0 2 2 2 2 3  0  PHQ- 9 Score  7 7 8  7      Fall Risk    04/11/2023    1:00 PM 01/30/2023    9:45 AM 12/29/2022   10:20 AM 11/29/2022   10:22 AM 03/29/2022    1:11 PM  Fall Risk   Falls in the past year? 0 0 0 0 0  Number falls in past yr: 0   0 0  Injury with Fall? 0  0 0 0  Risk for fall due to : Impaired vision   No Fall Risks Impaired mobility  Follow up Falls prevention discussed Falls evaluation completed  Falls evaluation completed Falls prevention discussed    MEDICARE RISK AT HOME: Medicare Risk at Home Any stairs in or around the home?: Yes If so, are there any without handrails?: No Home free of loose throw rugs in walkways, pet beds, electrical cords, etc?: Yes Adequate lighting in your home to reduce risk of falls?: Yes Life alert?: No Use of a cane, walker or w/c?: No Grab bars in the bathroom?: No Shower chair or bench in shower?: No Elevated toilet seat or a handicapped toilet?: No  TIMED UP AND GO:  Was the test performed?  No    Cognitive Function:        04/11/2023    9:11 AM 03/29/2022    1:14 PM  6CIT Screen  What Year? 0 points 0  points  What month? 0 points 0 points  What time? 0 points 0 points  Count back from 20 0 points 0 points  Months in reverse 0 points 0 points  Repeat phrase 0 points 0 points  Total Score 0 points 0 points    Immunizations Immunization History  Administered Date(s) Administered   Fluad Quad(high Dose 65+) 05/29/2019, 06/10/2020, 06/21/2021, 05/31/2022   Influenza, High Dose Seasonal PF 06/06/2013, 06/05/2017, 05/31/2018   Influenza,inj,Quad PF,6+ Mos 04/20/2015, 05/17/2016   PFIZER(Purple Top)SARS-COV-2 Vaccination 09/18/2019, 10/13/2019, 06/29/2020   Pneumococcal Conjugate-13 12/24/2013   Pneumococcal Polysaccharide-23 04/20/2015   Tdap 04/20/2015   Zoster Recombinant(Shingrix) 04/13/2022, 07/21/2022   Zoster, Live 07/04/2011    TDAP status: Due, Education has been provided regarding the importance of this vaccine. Advised may receive this vaccine at local pharmacy or Health Dept. Aware to provide a copy of the vaccination record if obtained from local pharmacy or Health Dept. Verbalized acceptance and understanding.  Flu Vaccine status: Due,  Education has been provided regarding the importance of this vaccine. Advised may receive this vaccine at local pharmacy or Health Dept. Aware to provide a copy of the vaccination record if obtained from local pharmacy or Health Dept. Verbalized acceptance and understanding.  Pneumococcal vaccine status: Up to date  Covid-19 vaccine status: Information provided on how to obtain vaccines.   Qualifies for Shingles Vaccine? Yes   Zostavax completed Yes   Shingrix Completed?: Yes  Screening Tests Health Maintenance  Topic Date Due   Colonoscopy  05/29/2021   INFLUENZA VACCINE  03/15/2023   COVID-19 Vaccine (4 - 2023-24 season) 04/15/2023   MAMMOGRAM  10/12/2023   Medicare Annual Wellness (AWV)  04/10/2024   DTaP/Tdap/Td (2 - Td or Tdap) 04/19/2025   Pneumonia Vaccine 27+ Years old  Completed   DEXA SCAN  Completed   Zoster Vaccines-  Shingrix  Completed   HPV VACCINES  Aged Out   Hepatitis C Screening  Discontinued    Health Maintenance  Health Maintenance Due  Topic Date Due   Colonoscopy  05/29/2021   INFLUENZA VACCINE  03/15/2023   COVID-19 Vaccine (4 - 2023-24 season) 04/15/2023    Colorectal cancer screening: Type of screening: Colonoscopy. Completed 05/29/16. Repeat every 5 years Pt stated she has an appt for counseltation  Mammogram status: Completed 10/12/22. Repeat every year  Bone Density status: Completed 09/05/21. Results reflect: Bone density results: OSTEOPOROSIS. Repeat every 2 years.   Additional Screening:  Hepatitis C Screening:  Completed 09/08/20  Vision Screening: Recommended annual ophthalmology exams for early detection of glaucoma and other disorders of the eye. Is the patient up to date with their annual eye exam?  Yes  Who is the provider or what is the name of the office in which the patient attends annual eye exams? Dr Sondra Barges  If pt is not established with a provider, would they like to be referred to a provider to establish care? No .   Dental Screening: Recommended annual dental exams for proper oral hygiene   Community Resource Referral / Chronic Care Management: CRR required this visit?  No   CCM required this visit?  No     Plan:     I have personally reviewed and noted the following in the patient's chart:   Medical and social history Use of alcohol, tobacco or illicit drugs  Current medications and supplements including opioid prescriptions. Patient is not currently taking opioid prescriptions. Functional ability and status Nutritional status Physical activity Advanced directives List of other physicians Hospitalizations, surgeries, and ER visits in previous 12 months Vitals Screenings to include cognitive, depression, and falls Referrals and appointments  In addition, I have reviewed and discussed with patient certain preventive protocols, quality metrics,  and best practice recommendations. A written personalized care plan for preventive services as well as general preventive health recommendations were provided to patient.     Marzella Schlein, LPN   08/19/1094   After Visit Summary: (MyChart) Due to this being a telephonic visit, the after visit summary with patients personalized plan was offered to patient via MyChart   Nurse Notes: none

## 2023-05-02 ENCOUNTER — Ambulatory Visit (INDEPENDENT_AMBULATORY_CARE_PROVIDER_SITE_OTHER): Payer: Medicare Other | Admitting: Family Medicine

## 2023-05-02 ENCOUNTER — Encounter: Payer: Self-pay | Admitting: Family Medicine

## 2023-05-02 VITALS — BP 113/74 | HR 77 | Temp 98.3°F | Wt 153.6 lb

## 2023-05-02 DIAGNOSIS — N1831 Chronic kidney disease, stage 3a: Secondary | ICD-10-CM

## 2023-05-02 DIAGNOSIS — E559 Vitamin D deficiency, unspecified: Secondary | ICD-10-CM

## 2023-05-02 DIAGNOSIS — I10 Essential (primary) hypertension: Secondary | ICD-10-CM | POA: Diagnosis not present

## 2023-05-02 DIAGNOSIS — F411 Generalized anxiety disorder: Secondary | ICD-10-CM

## 2023-05-02 DIAGNOSIS — F4310 Post-traumatic stress disorder, unspecified: Secondary | ICD-10-CM | POA: Diagnosis not present

## 2023-05-02 DIAGNOSIS — Z79899 Other long term (current) drug therapy: Secondary | ICD-10-CM

## 2023-05-02 LAB — BASIC METABOLIC PANEL WITH GFR
BUN: 15 mg/dL (ref 6–23)
CO2: 28 meq/L (ref 19–32)
Calcium: 10.5 mg/dL (ref 8.4–10.5)
Chloride: 106 meq/L (ref 96–112)
Creatinine, Ser: 1.2 mg/dL (ref 0.40–1.20)
GFR: 42.97 mL/min — ABNORMAL LOW (ref >60.00)
Glucose, Bld: 82 mg/dL (ref 70–99)
Potassium: 4.5 meq/L (ref 3.5–5.1)
Sodium: 142 meq/L (ref 135–145)

## 2023-05-02 LAB — VITAMIN D 25 HYDROXY (VIT D DEFICIENCY, FRACTURES): VITD: 24.49 ng/mL — ABNORMAL LOW (ref 30.00–100.00)

## 2023-05-02 MED ORDER — RISPERIDONE 1 MG PO TBDP: 1.0000 mg | ORAL_TABLET | Freq: Every day | ORAL | 1 refills | Status: AC

## 2023-05-02 MED ORDER — VITAMIN D (ERGOCALCIFEROL) 1.25 MG (50000 UNIT) PO CAPS: 50000.0000 [IU] | ORAL_CAPSULE | ORAL | 1 refills | Status: DC

## 2023-05-02 NOTE — Progress Notes (Signed)
OFFICE VISIT  05/02/2023  CC:  Chief Complaint  Patient presents with   Follow-up    3 month rci. She states she has still been having days where she cries for no reason.    Patient is a 82 y.o. female who presents for 21-month follow-up hypertension, chronic renal insufficiency stage III, vit D def, and depression and anxiety.  INTERIM HX: Renal fxn down slightly last visit so I d/c'd her losartan.  Recheck of renal fxn showed return to baseline GFR of 50. I recommended she remain off losartan. She is not certain whether or not she remained off this medication or not. No home blood pressure monitoring.  Also, vit D was low so I recommended she increase her 50K tab to twice a week. She confirms that she has done this.  She is sleeping better and overall feels a bit better regarding anxiety and emotional instability on the Risperdal at the 1 mg nightly dosing.  Also she remains on venlafaxine SR 225 mg a day and buspirone 10 mg 3 times a day.  She does have lorazepam 1 mg which she uses occasionally.  PMP AWARE reviewed today: most recent rx for lorazepam was filled 11/29/2022, # 30, rx by me. No red flags.  ROS as above, plus--> no fevers, no CP, no SOB, no wheezing, no cough, no dizziness, no HAs, no rashes, no melena/hematochezia.  No polyuria or polydipsia.  No myalgias or arthralgias.  No focal weakness, paresthesias, or tremors.  No acute vision or hearing abnormalities.  No dysuria or unusual/new urinary urgency or frequency.  No recent changes in lower legs. No n/v/d or abd pain.  No palpitations.     Past Medical History:  Diagnosis Date   Anxiety and depression    with PTSD.  Dr. Eden Emms ordered repeat MRI brain 2017 (no acute, no change from prior MR done in 2012) and referred pt to neuro for neuropsych testing but pt did not go.  Repeat MRI 10/2017 mild chronic microvasc isch change --stable compared to prior MRI.   Arthritis    Bilateral carpal tunnel syndrome     Cataracts, bilateral    Chronic renal insufficiency, stage III (moderate) (HCC)    Colitis    hx of   DDD (degenerative disc disease), lumbar 2016   L1-2 and L5-S1 fairly severe--intramuscular injection of steroid and toradol by Dr. Raeanne Barry 02/24/14 to calm down the pain   Enterocele 05/2016   Dx'd by Dr. Lily Peer (GYN); he referred pt back to the MD at [facility] that did her prior bladder sling procedure.  No rectocele.   GERD (gastroesophageal reflux disease)    Goiter    Lobectomy (left) of thyroid for benign nodule.  Right lobe nodules followed by Dr. Talmage Nap (FNA 2012 benign goiter) with ultrasounds and have been stable, most recently 01/10/13.     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   Hyperlipidemia    Hypertension    Lithium toxicity 2019   Lumbar spondylolysis 2016   L5: with grade I spondylolisthesis L5 on S1   Osteoporosis 2012; 2018   2012-"penia".  2018 "porosis"--alendronate started 12/2016.  Repeat DEXA 12/2018. DEXA 08/2021 T score -3.3   PFO (patent foramen ovale) echo 03/21/05   "hole in heart" saw Dr. Melina Modena [phone] [facility]. hospital.  Plavix med mgmt.  Dr. Eden Emms repeated echo w/bubble study 05/2016 and it  showed NO PFO or ASD.   Subclinical hypothyroidism    Dr. Talmage Nap has her on 25 mcg synthroid qd as of 01/2014   TIA (transient ischemic attack)    patient on plavix,  saw Dr. Quentin Mulling at [facility].hospital in [place].  MRI brain 05/2016: mild chronic small vessel dz, o/w normal.   Urinary tract infection    hx of   Vertigo    Vitamin D deficiency     Past Surgical History:  Procedure Laterality Date   ABDOMINAL HYSTERECTOMY     BLADDER SUSPENSION     BREAST EXCISIONAL BIOPSY Right 1960   BREAST EXCISIONAL BIOPSY Left    BREAST SURGERY     "breast lumps removed"   COLONOSCOPY W/ POLYPECTOMY  04/2011; 05/29/16   +Diverticulosis and int hem.  Adenomatous polyp 2012.   No polyps on repeat TCS 05/2016--recall 5 yrs (05/2021)   DEXA  05/23/2011   2012 T-score -2.2. 12/2016 T-score -2.6.  08/2021 T score -3.3   DILATION AND CURETTAGE OF UTERUS     diverticulosis  05/2016   Noted on colonoscopy   RECTOCELE REPAIR     purse string   THYROIDECTOMY     Left lobectomy   TOTAL KNEE ARTHROPLASTY  07/26/2012   Procedure: TOTAL KNEE ARTHROPLASTY;  Surgeon: Thera Flake., MD;  Location: [facility] OR;  Service: Orthopedics;  Laterality: Right;   TRANSTHORACIC ECHOCARDIOGRAM  05/24/2016   EF 60-65%, normal LV function.  No DD. No PFO or ASD (bubble study WAS done).    Outpatient Medications Prior to Visit  Medication Sig Dispense Refill   alendronate (FOSAMAX) 70 MG tablet Take 1 tablet (70 mg total) by mouth once a week. 12 tablet 3   amLODipine (NORVASC) 10 MG tablet Take 1 tablet (10 mg total) by mouth daily. 90 tablet 1   aspirin EC 81 MG tablet Take 1 tablet (81 mg total) by mouth daily. 30 tablet 0   atorvastatin (LIPITOR) 20 MG tablet Take 1 tablet (20 mg total) by mouth daily. 90 tablet 1   busPIRone (BUSPAR) 10 MG tablet Take 1 tablet (10 mg total) by mouth 3 (three) times daily. 270 tablet 1   Cholecalciferol (VITAMIN D-3 PO) Take by mouth daily.     levothyroxine (SYNTHROID) 50 MCG tablet Take 1 tablet (50 mcg total) by mouth daily before breakfast. 90 tablet 1   LORazepam (ATIVAN) 1 MG tablet 1 tab po qd 30 tablet 1   Multiple Vitamin (MV-ONE) CAPS Take 1 capsule by mouth daily.     Omega-3 Fatty Acids (FISH OIL) 500 MG CAPS Take 1 capsule by mouth daily.     Venlafaxine HCl 225 MG TB24 1 tab po qd 90 tablet 3   risperiDONE (RISPERDAL M-TABS) 1 MG disintegrating tablet Take 1 tablet (1 mg total) by mouth at bedtime. 90 tablet 1   Vitamin D, Ergocalciferol, (DRISDOL) 1.25 MG (50000 UNIT) CAPS capsule Take 1 capsule (50,000 Units total) by mouth 2 (two) times a week. 90 capsule 1   No facility-administered medications prior to visit.    Allergies  Allergen  Reactions   Lamictal [Lamotrigine] Other (See Comments)   Lithium Other (See Comments)    toxicity   Phenylephrine Rash    Patient had allergic reaction to dilation combo drop. Please dilate with Tropicamide 0.5% only with Fluress and/or Proparacaine    Sulfa Drugs Cross Reactors Rash    Review of Systems As per HPI  PE:    05/02/2023  10:21 AM 04/11/2023   12:55 PM 01/30/2023    9:43 AM  Vitals with BMI  Weight 153 lbs 10 oz 156 lbs 156 lbs  Systolic 113  128  Diastolic 74  79  Pulse 77  90     Physical Exam  Gen: Alert, well appearing.  Patient is oriented to person, place, time, and situation. AFFECT: Anxious, pleasant, lucid thought and speech. No further exam today  LABS:  Last CBC Lab Results  Component Value Date   WBC 6.3 03/17/2021   HGB 13.8 03/17/2021   HCT 42.2 03/17/2021   MCV 89.8 03/17/2021   MCH 29.3 07/28/2012   RDW 13.7 03/17/2021   PLT 207.0 03/17/2021   Last metabolic panel Lab Results  Component Value Date   GLUCOSE 81 12/12/2022   NA 142 12/12/2022   K 4.8 12/12/2022   CL 107 12/12/2022   CO2 27 12/12/2022   BUN 14 12/12/2022   CREATININE 1.06 12/12/2022   GFR 50.00 (L) 12/12/2022   CALCIUM 10.5 12/12/2022   PHOS 2.9 07/02/2015   PROT 6.5 11/29/2022   ALBUMIN 4.4 11/29/2022   BILITOT 0.7 11/29/2022   ALKPHOS 91 11/29/2022   AST 14 11/29/2022   ALT 13 11/29/2022   Last lipids Lab Results  Component Value Date   CHOL 177 11/29/2022   HDL 60.00 11/29/2022   LDLCALC 88 11/29/2022   TRIG 143.0 11/29/2022   CHOLHDL 3 11/29/2022   Last thyroid functions Lab Results  Component Value Date   TSH 4.66 11/29/2022   T3TOTAL 94 10/22/2017   Last vitamin D Lab Results  Component Value Date   VD25OH 21.80 (L) 11/29/2022   Last vitamin B12 and Folate Lab Results  Component Value Date   VITAMINB12 361 05/06/2012   IMPRESSION AND PLAN:  #1 PTSD (recurrent depression, prominent anxiety, social anxiety, agoraphobia): Stable on  Risperdal 1 mg nightly, venlafaxine SR 225 mg a day, buspirone 10 mg 3 times daily, and lorazepam 1 mg daily as needed.  2.  Hypertension, well-controlled on amlodipine 10 mg a day.  Is not clear whether she is still taking her losartan.  I had meant for her to discontinue this completely after last visit. She will go home and try to confirm whether or not she is taking this. Electrolytes and creatinine monitoring today.  3.  Chronic renal insufficiency stage III.  Brief dip in renal function last visit.  This returned to her baseline after we discontinue losartan. See #2 above. Electrolytes and creatinine today.  4.  Vitamin D deficiency. Increased dose to 50,000 units twice a week a few months ago. Recheck vitamin D level today.   An After Visit Summary was printed and given to the patient.  FOLLOW UP: Return in about 6 months (around 10/30/2023) for routine chronic illness f/u.  Signed:  Santiago Bumpers, MD           05/02/2023

## 2023-07-13 ENCOUNTER — Other Ambulatory Visit: Payer: Self-pay | Admitting: Family Medicine

## 2023-07-17 ENCOUNTER — Other Ambulatory Visit: Payer: Self-pay | Admitting: Family Medicine

## 2023-07-18 ENCOUNTER — Other Ambulatory Visit: Payer: Self-pay | Admitting: Family Medicine

## 2023-09-19 DIAGNOSIS — E559 Vitamin D deficiency, unspecified: Secondary | ICD-10-CM | POA: Diagnosis not present

## 2023-09-19 DIAGNOSIS — M810 Age-related osteoporosis without current pathological fracture: Secondary | ICD-10-CM | POA: Diagnosis not present

## 2023-09-19 DIAGNOSIS — I10 Essential (primary) hypertension: Secondary | ICD-10-CM | POA: Diagnosis not present

## 2023-09-19 DIAGNOSIS — E210 Primary hyperparathyroidism: Secondary | ICD-10-CM | POA: Diagnosis not present

## 2023-09-19 DIAGNOSIS — E890 Postprocedural hypothyroidism: Secondary | ICD-10-CM | POA: Diagnosis not present

## 2023-10-30 ENCOUNTER — Other Ambulatory Visit: Payer: Self-pay | Admitting: Family Medicine

## 2023-10-30 DIAGNOSIS — Z1231 Encounter for screening mammogram for malignant neoplasm of breast: Secondary | ICD-10-CM

## 2023-10-30 NOTE — Progress Notes (Unsigned)
 OFFICE VISIT  10/31/2023  CC:  Chief Complaint  Patient presents with   Anxiety    6 month RCI; pt is fasting; pt prefers not to do colon cancer screening   Patient is a 83 y.o. female who presents for 6 mo f/u PTSD/Anx/Dep, HTN, CRI III, hypothyroidism, and vit D def. A/P as of last visit: "#1 PTSD (recurrent depression, prominent anxiety, social anxiety, agoraphobia): Stable on Risperdal 1 mg nightly, venlafaxine SR 225 mg a day, buspirone 10 mg 3 times daily, and lorazepam 1 mg daily as needed.   2.  Hypertension, well-controlled on amlodipine 10 mg a day.  Is not clear whether she is still taking her losartan.  I had meant for her to discontinue this completely after last visit. She will go home and try to confirm whether or not she is taking this. Electrolytes and creatinine monitoring today.   3.  Chronic renal insufficiency stage III.  Brief dip in renal function last visit.  This returned to her baseline after we discontinue losartan. See #2 above. Electrolytes and creatinine today.   4.  Vitamin D deficiency. Increased dose to 50,000 units twice a week a few months ago. Recheck vitamin D level today."  INTERIM HX: Insurance requires her to get off of the Effexor. Duloxetine is an alternative they will cover. She feels like she has been pretty stable from a psychiatric standpoint.  Sleep is better.  She feels like she hydrates well.  She states that her endocrinologist, Dr. Talmage Nap, did a bone density test recently and patient states the result was "bad".  Carla Spencer has been on Fosamax for years and her bone density continues to be in osteoporosis range.  PMP AWARE reviewed today: most recent rx for lorazepam was filled 11/29/22, # 30, rx by me. No red flags.  ROS as above, plus--> no fevers, no CP, no SOB, no wheezing, no cough, no dizziness, no HAs, no rashes, no melena/hematochezia.  No polyuria or polydipsia.  No myalgias or arthralgias.  No focal weakness, paresthesias, or  tremors.  No acute vision or hearing abnormalities.  No dysuria or unusual/new urinary urgency or frequency.  No recent changes in lower legs. No n/v/d or abd pain.  No palpitations.     Past Medical History:  Diagnosis Date   Anxiety and depression    with PTSD.  Dr. Eden Emms ordered repeat MRI brain 2017 (no acute, no change from prior MR done in 2012) and referred pt to neuro for neuropsych testing but pt did not go.  Repeat MRI 10/2017 mild chronic microvasc isch change --stable compared to prior MRI.   Arthritis    Bilateral carpal tunnel syndrome    Cataracts, bilateral    Chronic renal insufficiency, stage III (moderate) (HCC)    Colitis    hx of   DDD (degenerative disc disease), lumbar 2016   L1-2 and L5-S1 fairly severe--intramuscular injection of steroid and toradol by Dr. Raeanne Barry 02/24/14 to calm down the pain   Enterocele 05/2016   Dx'd by Dr. Lily Peer (GYN); he referred pt back to the MD at [facility] that did her prior bladder sling procedure.  No rectocele.   GERD (gastroesophageal reflux disease)    Goiter    Lobectomy (left) of thyroid for benign nodule.  Right lobe nodules followed by Dr. Talmage Nap (FNA 2012 benign goiter) with ultrasounds and have been stable, most recently 01/10/13.     H/O adenomatous polyp of colon 2012; 05/2016   5 yr recall  Hypercalcemia 10/2017   suspected to be due to lithium toxicity.   Hyperlipidemia    Hypertension    Lithium toxicity 2019   Lumbar spondylolysis 2016   L5: with grade I spondylolisthesis L5 on S1   Osteoporosis 2012; 2018   2012-"penia".  2018 "porosis"--alendronate started 12/2016.  Repeat DEXA 12/2018. DEXA 08/2021 T score -3.3   PFO (patent foramen ovale) echo 03/21/05   "hole in heart" saw Dr. Melina Modena [phone]. hospital.  Plavix med mgmt.  Dr. Eden Emms repeated echo w/bubble study 05/2016 and it showed NO PFO or ASD.   Subclinical hypothyroidism    Dr. Talmage Nap has her on 25 mcg synthroid qd as of 01/2014   TIA  (transient ischemic attack)    patient on plavix,  saw Dr. Quentin Mulling at [facility].hospital in [place].  MRI brain 05/2016: mild chronic small vessel dz, o/w normal.   Urinary tract infection    hx of   Vertigo    Vitamin D deficiency     Past Surgical History:  Procedure Laterality Date   ABDOMINAL HYSTERECTOMY     BLADDER SUSPENSION     BREAST EXCISIONAL BIOPSY Right 1960   BREAST EXCISIONAL BIOPSY Left    BREAST SURGERY     "breast lumps removed"   COLONOSCOPY W/ POLYPECTOMY  04/2011; 05/29/16   +Diverticulosis and int hem.  Adenomatous polyp 2012.  No polyps on repeat TCS 05/2016--recall 5 yrs (05/2021)   DEXA  05/23/2011   2012 T-score -2.2. 12/2016 T-score -2.6.  08/2021 T score -3.3   DILATION AND CURETTAGE OF UTERUS     diverticulosis  05/2016   Noted on colonoscopy   RECTOCELE REPAIR     purse string   THYROIDECTOMY     Left lobectomy   TOTAL KNEE ARTHROPLASTY  07/26/2012   Procedure: TOTAL KNEE ARTHROPLASTY;  Surgeon: Thera Flake., MD;  Location: [facility] OR;  Service: Orthopedics;  Laterality: Right;   TRANSTHORACIC ECHOCARDIOGRAM  05/24/2016   EF 60-65%, normal LV function.  No DD. No PFO or ASD (bubble study WAS done).    Outpatient Medications Prior to Visit  Medication Sig Dispense Refill   alendronate (FOSAMAX) 70 MG tablet Take 1 tablet (70 mg total) by mouth once a week. 12 tablet 3   amLODipine (NORVASC) 10 MG tablet Take 1 tablet (10 mg total) by mouth daily. 90 tablet 1   aspirin EC 81 MG tablet Take 1 tablet (81 mg total) by mouth daily. 30 tablet 0   atorvastatin (LIPITOR) 20 MG tablet Take 1 tablet (20 mg total) by mouth daily. 90 tablet 1   busPIRone (BUSPAR) 10 MG tablet Take 1 tablet (10 mg total) by mouth 3 (three) times daily. 270 tablet 1   Cholecalciferol (VITAMIN D-3 PO) Take by mouth daily.     levothyroxine (SYNTHROID) 50 MCG tablet Take 1 tablet (50 mcg total) by mouth daily before breakfast. 90 tablet 1   LORazepam (ATIVAN) 1 MG tablet 1 tab po  qd 30 tablet 1   Multiple Vitamin (MV-ONE) CAPS Take 1 capsule by mouth daily.     Omega-3 Fatty Acids (FISH OIL) 500 MG CAPS Take 1 capsule by mouth daily.     risperiDONE (RISPERDAL M-TABS) 1 MG disintegrating tablet Take 1 tablet (1 mg total) by mouth at bedtime. 90 tablet 1   Venlafaxine HCl 225 MG TB24 Take 1 tablet by mouth once daily 90 tablet 0   Vitamin D, Ergocalciferol, (DRISDOL) 1.25 MG (50000 UNIT)  CAPS capsule Take 1 capsule (50,000 Units total) by mouth 2 (two) times a week. 24 capsule 1   No facility-administered medications prior to visit.    Allergies  Allergen Reactions   Lamictal [Lamotrigine] Other (See Comments)   Lithium Other (See Comments)    toxicity   Phenylephrine Rash    Patient had allergic reaction to dilation combo drop. Please dilate with Tropicamide 0.5% only with Fluress and/or Proparacaine    Sulfa Drugs Cross Reactors Rash    Review of Systems As per HPI  PE:    10/31/2023   10:23 AM 05/02/2023   10:21 AM 04/11/2023   12:55 PM  Vitals with BMI  Weight 142 lbs 6 oz 153 lbs 10 oz 156 lbs  Systolic 115 113   Diastolic 72 74   Pulse 78 77      Physical Exam  Gen: Alert, well appearing.  Patient is oriented to person, place, time, and situation. AFFECT: pleasant, lucid thought and speech. No further exam today  LABS:  Last CBC Lab Results  Component Value Date   WBC 6.3 03/17/2021   HGB 13.8 03/17/2021   HCT 42.2 03/17/2021   MCV 89.8 03/17/2021   MCH 29.3 07/28/2012   RDW 13.7 03/17/2021   PLT 207.0 03/17/2021   Last metabolic panel Lab Results  Component Value Date   GLUCOSE 82 05/02/2023   NA 142 05/02/2023   K 4.5 05/02/2023   CL 106 05/02/2023   CO2 28 05/02/2023   BUN 15 05/02/2023   CREATININE 1.20 05/02/2023   GFR 42.97 (L) 05/02/2023   CALCIUM 10.5 05/02/2023   PHOS 2.9 07/02/2015   PROT 6.5 11/29/2022   ALBUMIN 4.4 11/29/2022   BILITOT 0.7 11/29/2022   ALKPHOS 91 11/29/2022   AST 14 11/29/2022   ALT 13  11/29/2022   Last lipids Lab Results  Component Value Date   CHOL 177 11/29/2022   HDL 60.00 11/29/2022   LDLCALC 88 11/29/2022   TRIG 143.0 11/29/2022   CHOLHDL 3 11/29/2022   Last thyroid functions Lab Results  Component Value Date   TSH 4.66 11/29/2022   T3TOTAL 94 10/22/2017   Last vitamin D Lab Results  Component Value Date   VD25OH 24.49 (L) 05/02/2023   Last vitamin B12 and Folate Lab Results  Component Value Date   VITAMINB12 361 05/06/2012   IMPRESSION AND PLAN:  #1 PTSD (recurrent depression, prominent anxiety, social anxiety, agoraphobia): Stable on Risperdal 1 mg nightly, venlafaxine SR 225 mg a day, buspirone 10 mg 3 times daily, and lorazepam 1 mg daily as needed. However, insurance no longer covers her venlafaxine.  Will switch her over to Cymbalta 30 mg a day and recheck in 1 month.  2.  Hypertension, well-controlled on amlodipine 10 mg a day.   Electrolytes and creatinine monitoring today.   3.  Chronic renal insufficiency stage III.   Electrolytes and creatinine today.   4.  Vitamin D deficiency. She has not been taking her 50,000 unit tab as often lately as prescribed. Monitor level today.  #5 preventative health care: Vaccines: all UTD Labs: fasting HP + vit D Cervical ca screening: no screening indicated Breast ca screening: mammogram scheduled for 11/14/23. Colon ca screening: no further screening indicated.  #6 osteoporosis: on fosamax x > 5 yrs. Rpt DEXA at her endocrinologist recently remained in the osteoporotic range per patient report.  I do not have records.  I will go ahead and make a formal referral to Dr. Talmage Nap  for further management of her osteoporosis (she already sees Dr. Talmage Nap for her hypothyroidism). Cont Vit D and Calcium.  An After Visit Summary was printed and given to the patient.  FOLLOW UP: No follow-ups on file.  Signed:  Santiago Bumpers, MD           10/31/2023

## 2023-10-31 ENCOUNTER — Encounter: Payer: Self-pay | Admitting: Family Medicine

## 2023-10-31 ENCOUNTER — Ambulatory Visit (INDEPENDENT_AMBULATORY_CARE_PROVIDER_SITE_OTHER): Payer: Medicare Other | Admitting: Family Medicine

## 2023-10-31 VITALS — BP 115/72 | HR 78 | Temp 97.9°F | Wt 142.4 lb

## 2023-10-31 DIAGNOSIS — M810 Age-related osteoporosis without current pathological fracture: Secondary | ICD-10-CM

## 2023-10-31 DIAGNOSIS — E559 Vitamin D deficiency, unspecified: Secondary | ICD-10-CM | POA: Diagnosis not present

## 2023-10-31 DIAGNOSIS — I10 Essential (primary) hypertension: Secondary | ICD-10-CM

## 2023-10-31 DIAGNOSIS — Z23 Encounter for immunization: Secondary | ICD-10-CM | POA: Diagnosis not present

## 2023-10-31 DIAGNOSIS — F3341 Major depressive disorder, recurrent, in partial remission: Secondary | ICD-10-CM

## 2023-10-31 DIAGNOSIS — E890 Postprocedural hypothyroidism: Secondary | ICD-10-CM

## 2023-10-31 DIAGNOSIS — N1831 Chronic kidney disease, stage 3a: Secondary | ICD-10-CM | POA: Diagnosis not present

## 2023-10-31 LAB — COMPREHENSIVE METABOLIC PANEL
ALT: 20 U/L (ref 0–35)
AST: 20 U/L (ref 0–37)
Albumin: 4.5 g/dL (ref 3.5–5.2)
Alkaline Phosphatase: 97 U/L (ref 39–117)
BUN: 16 mg/dL (ref 6–23)
CO2: 30 meq/L (ref 19–32)
Calcium: 11 mg/dL — ABNORMAL HIGH (ref 8.4–10.5)
Chloride: 104 meq/L (ref 96–112)
Creatinine, Ser: 0.91 mg/dL (ref 0.40–1.20)
GFR: 59.68 mL/min — ABNORMAL LOW (ref >60.00)
Glucose, Bld: 71 mg/dL (ref 70–99)
Potassium: 4.5 meq/L (ref 3.5–5.1)
Sodium: 142 meq/L (ref 135–145)
Total Bilirubin: 0.9 mg/dL (ref 0.2–1.2)
Total Protein: 6.6 g/dL (ref 6.0–8.3)

## 2023-10-31 LAB — LIPID PANEL
Cholesterol: 190 mg/dL (ref 0–200)
HDL: 64.4 mg/dL (ref >39.00)
LDL Cholesterol: 105 mg/dL — ABNORMAL HIGH (ref 0–99)
NonHDL: 125.67
Total CHOL/HDL Ratio: 3
Triglycerides: 103 mg/dL (ref 0.0–149.0)
VLDL: 20.6 mg/dL (ref 0.0–40.0)

## 2023-10-31 LAB — VITAMIN D 25 HYDROXY (VIT D DEFICIENCY, FRACTURES): VITD: 28.32 ng/mL — ABNORMAL LOW (ref 30.00–100.00)

## 2023-10-31 LAB — TSH: TSH: 5.99 u[IU]/mL — ABNORMAL HIGH (ref 0.35–5.50)

## 2023-10-31 MED ORDER — DULOXETINE HCL 30 MG PO CPEP: 30.0000 mg | ORAL_CAPSULE | Freq: Every day | ORAL | 1 refills | Status: DC

## 2023-10-31 MED ORDER — LEVOTHYROXINE SODIUM 50 MCG PO TABS: 50.0000 ug | ORAL_TABLET | Freq: Every day | ORAL | 3 refills | Status: AC

## 2023-10-31 NOTE — Patient Instructions (Signed)
 Stop Effexor (venlafaxine).  The next day go ahead and start Cymbalta.  You do not need to take Fosamax anymore.

## 2023-11-01 ENCOUNTER — Telehealth: Payer: Self-pay

## 2023-11-01 MED ORDER — VITAMIN D (ERGOCALCIFEROL) 1.25 MG (50000 UNIT) PO CAPS: 50000.0000 [IU] | ORAL_CAPSULE | ORAL | 1 refills | Status: DC

## 2023-11-01 NOTE — Telephone Encounter (Signed)
-----   Message from Jeoffrey Massed sent at 11/01/2023 11:51 AM EDT ----- Please notify Carla Spencer that all of her labs are great except LDL cholesterol has risen a little bit. I do not recommend a medication change but if it is still up at the next recheck then we will increase her dose of atorvastatin. Also, vitamin D level is still low.  Encouraged her to try to take the prescription vitamin D tab 2 times per week and try to get more sunlight every day. Please renew her prescription for vitamin D if she is out (86-month supply with 1 additional refill).

## 2023-11-14 ENCOUNTER — Ambulatory Visit
Admission: RE | Admit: 2023-11-14 | Discharge: 2023-11-14 | Disposition: A | Discharge status: Home / Self Care | Source: Ambulatory Visit | Attending: Family Medicine | Admitting: Family Medicine

## 2023-11-14 DIAGNOSIS — Z1231 Encounter for screening mammogram for malignant neoplasm of breast: Secondary | ICD-10-CM | POA: Diagnosis not present

## 2023-11-16 DIAGNOSIS — I10 Essential (primary) hypertension: Secondary | ICD-10-CM | POA: Diagnosis not present

## 2023-11-16 DIAGNOSIS — M810 Age-related osteoporosis without current pathological fracture: Secondary | ICD-10-CM | POA: Diagnosis not present

## 2023-11-16 DIAGNOSIS — E210 Primary hyperparathyroidism: Secondary | ICD-10-CM | POA: Diagnosis not present

## 2023-11-16 DIAGNOSIS — E559 Vitamin D deficiency, unspecified: Secondary | ICD-10-CM | POA: Diagnosis not present

## 2023-11-16 DIAGNOSIS — E890 Postprocedural hypothyroidism: Secondary | ICD-10-CM | POA: Diagnosis not present

## 2023-11-26 NOTE — Patient Instructions (Incomplete)

## 2023-11-27 NOTE — Progress Notes (Unsigned)
 OFFICE VISIT  11/27/2023  CC: No chief complaint on file.   Patient is a 83 y.o. female who presents for follow-up anxiety and depression. 1 PTSD (recurrent depression, prominent anxiety, social anxiety, agoraphobia): Stable on Risperdal 1 mg nightly, venlafaxine SR 225 mg a day, buspirone 10 mg 3 times daily, and lorazepam 1 mg daily as needed. However, insurance no longer covers her venlafaxine.  Will switch her over to Cymbalta 30 mg a day and recheck in 1 month.   2.  Hypertension, well-controlled on amlodipine 10 mg a day.   Electrolytes and creatinine monitoring today.   3.  Chronic renal insufficiency stage III.   Electrolytes and creatinine today.   4.  Vitamin D deficiency. She has not been taking her 50,000 unit tab as often lately as prescribed. Monitor level today.   #5 preventative health care: Vaccines: all UTD Labs: fasting HP + vit D Cervical ca screening: no screening indicated Breast ca screening: mammogram scheduled for 11/14/23. Colon ca screening: no further screening indicated.   #6 osteoporosis: on fosamax x > 5 yrs. Rpt DEXA at her endocrinologist recently remained in the osteoporotic range per patient report.  I do not have records.  I will go ahead and make a formal referral to Dr. Balan for further management of her osteoporosis (she already sees Dr. Balan for her hypothyroidism). Cont Vit D and Calcium."  INTERIM HX: She notes no adverse effects from the duloxetine. No worsening or improvement in her chronic anxiety and depression.  Vitamin D remains low last visit.  It seems she has not taking her prescription vitamin D tabs. She is not sure if she even has these at home anymore.    Past Medical History:  Diagnosis Date   Anxiety and depression    with PTSD.  Dr. Stann Earnest ordered repeat MRI brain 2017 (no acute, no change from prior MR done in 2012) and referred pt to neuro for neuropsych testing but pt did not go.  Repeat MRI 10/2017 mild chronic  microvasc isch change --stable compared to prior MRI.   Arthritis    Bilateral carpal tunnel syndrome    Cataracts, bilateral    Chronic renal insufficiency, stage III (moderate) (HCC)    Colitis    hx of   DDD (degenerative disc disease), lumbar 2016   L1-2 and L5-S1 fairly severe--intramuscular injection of steroid and toradol by Dr. Ibazibo 02/24/14 to calm down the pain   Enterocele 05/2016   Dx'd by Dr. Annitta Kindler (GYN); he referred pt back to the MD at [facility] that did her prior bladder sling procedure.  No rectocele.   GERD (gastroesophageal reflux disease)    Goiter    Lobectomy (left) of thyroid for benign nodule.  Right lobe nodules followed by Dr. Ronelle Coffee (FNA 2012 benign goiter) with ultrasounds and have been stable, most recently 01/10/13.     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   Hyperlipidemia    Hypertension    Lithium toxicity 2019   Lumbar spondylolysis 2016   L5: with grade I spondylolisthesis L5 on S1   Osteoporosis 2012; 2018   2012-"penia".  2018 "porosis"--alendronate started 12/2016.  Repeat DEXA 12/2018. DEXA 08/2021 T score -3.3   PFO (patent foramen ovale) echo 03/21/05   "hole in heart" saw Dr. Doria Garden [phone] [facility]. hospital.  [facility] med mgmt.  Dr. Stann Earnest repeated echo w/bubble study  05/2016 and it showed NO PFO or ASD.   Subclinical hypothyroidism    Dr. Talmage Nap has her on 25 mcg synthroid qd as of 01/2014   TIA (transient ischemic attack)    patient on plavix,  saw Dr. Quentin Mulling at [facility]hospital in [place].  MRI brain 05/2016: mild chronic small vessel dz, o/w normal.   Urinary tract infection    hx of   Vertigo    Vitamin D deficiency     Past Surgical History:  Procedure Laterality Date   ABDOMINAL HYSTERECTOMY     BLADDER SUSPENSION     BREAST EXCISIONAL BIOPSY Right 1960   BREAST EXCISIONAL BIOPSY Left    BREAST SURGERY     "breast lumps  removed"   COLONOSCOPY W/ POLYPECTOMY  04/2011; 05/29/16   +Diverticulosis and int hem.  Adenomatous polyp 2012.  No polyps on repeat TCS 05/2016--recall 5 yrs (05/2021)   DEXA  05/23/2011   2012 T-score -2.2. 12/2016 T-score -2.6.  08/2021 T score -3.3   DILATION AND CURETTAGE OF UTERUS     diverticulosis  05/2016   Noted on colonoscopy   RECTOCELE REPAIR     purse string   THYROIDECTOMY     Left lobectomy   TOTAL KNEE ARTHROPLASTY  07/26/2012   Procedure: TOTAL KNEE ARTHROPLASTY;  Surgeon: Thera Flake., MD;  Location: [facility] OR;  Service: Orthopedics;  Laterality: Right;   TRANSTHORACIC ECHOCARDIOGRAM  05/24/2016   EF 60-65%, normal LV function.  No DD. No PFO or ASD (bubble study WAS done).    Outpatient Medications Prior to Visit  Medication Sig Dispense Refill   alendronate (FOSAMAX) 70 MG tablet Take 1 tablet (70 mg total) by mouth once a week. 12 tablet 3   amLODipine (NORVASC) 10 MG tablet Take 1 tablet (10 mg total) by mouth daily. 90 tablet 1   aspirin EC 81 MG tablet Take 1 tablet (81 mg total) by mouth daily. 30 tablet 0   atorvastatin (LIPITOR) 20 MG tablet Take 1 tablet (20 mg total) by mouth daily. 90 tablet 1   busPIRone (BUSPAR) 10 MG tablet Take 1 tablet (10 mg total) by mouth 3 (three) times daily. 270 tablet 1   Cholecalciferol (VITAMIN D-3 PO) Take by mouth daily.     DULoxetine (CYMBALTA) 30 MG capsule Take 1 capsule (30 mg total) by mouth daily. 30 capsule 1   levothyroxine (SYNTHROID) 50 MCG tablet Take 1 tablet (50 mcg total) by mouth daily before breakfast. 90 tablet 3   LORazepam (ATIVAN) 1 MG tablet 1 tab po qd 30 tablet 1   Multiple Vitamin (MV-ONE) CAPS Take 1 capsule by mouth daily.     Omega-3 Fatty Acids (FISH OIL) 500 MG CAPS Take 1 capsule by mouth daily.     risperiDONE (RISPERDAL M-TABS) 1 MG disintegrating tablet Take 1 tablet (1 mg total) by mouth at bedtime. 90 tablet 1   Vitamin D, Ergocalciferol, (DRISDOL) 1.25 MG (50000 UNIT) CAPS capsule Take 1  capsule (50,000 Units total) by mouth 2 (two) times a week. 24 capsule 1   No facility-administered medications prior to visit.    Allergies  Allergen Reactions   Lamictal [Lamotrigine] Other (See Comments)   Lithium Other (See Comments)    toxicity   Phenylephrine Rash    Patient had allergic reaction to dilation combo drop. Please dilate with Tropicamide 0.5% only with Fluress and/or Proparacaine    Sulfa Drugs Cross Reactors Rash    Review of Systems As per  HPI  PE:    10/31/2023   10:23 AM 05/02/2023   10:21 AM 04/11/2023   12:55 PM  Vitals with BMI  Weight 142 lbs 6 oz 153 lbs 10 oz 156 lbs  Systolic 115 113   Diastolic 72 74   Pulse 78 77      Physical Exam  Gen: Alert, well appearing.  Patient is oriented to person, place, time, and situation. AFFECT: pleasant, lucid thought and speech. No further exam today  LABS:  Last metabolic panel Lab Results  Component Value Date   GLUCOSE 71 10/31/2023   NA 142 10/31/2023   K 4.5 10/31/2023   CL 104 10/31/2023   CO2 30 10/31/2023   BUN 16 10/31/2023   CREATININE 0.91 10/31/2023   GFR 59.68 (L) 10/31/2023   CALCIUM 11.0 (H) 10/31/2023   PHOS 2.9 07/02/2015   PROT 6.6 10/31/2023   ALBUMIN 4.5 10/31/2023   BILITOT 0.9 10/31/2023   ALKPHOS 97 10/31/2023   AST 20 10/31/2023   ALT 20 10/31/2023    Last vitamin D Lab Results  Component Value Date   VD25OH 28.32 (L) 10/31/2023   Lab Results  Component Value Date   TSH 5.99 (H) 10/31/2023  Had to switch IMPRESSION AND PLAN:  #1 PTSD (recurrent depression, prominent anxiety, social anxiety, agoraphobia): Stable on Risperdal 1 mg nightly, venlafaxine SR 225 mg a day, buspirone 10 mg 3 times daily, and lorazepam 1 mg daily as needed. Due to insurance requirements we switched her over from venlafaxine to duloxetine 30 mg a day last visit. Will increase the dose today to 40 mg daily.  #2 Vit D def: Level was 28 about 1 month ago. Noncompliant with medication)  50,000 unit vitamin D tab 1-2 times a week). She will check to see if she still has these pills at home and call if she does not. Encouraged her to take these 2 times a week until I see her again in 1 month and we will recheck vitamin D level at that time.  3. Osteoporosis: on fosamax x > 5 yrs. Rpt DEXA at her endocrinologist recently remained in the osteoporotic range per patient report.  I do not have records--> we are having lots of trouble getting their office to respond to our requests. I will start managing from here on out.  4.  Acquired hypothyroidism.  She has been seeing Dr. Ronelle Coffee.  See #3 above.  Continue levothyroxine 50 mcg a day. Next TSH in approximately 6 months.  #5 preventative health care: Vaccines: all UTD Cervical ca screening: no screening indicated Breast ca screening: Next mammogram due 11/2024. Colon ca screening: no further screening indicated  An After Visit Summary was printed and given to the patient.  FOLLOW UP: No follow-ups on file.  Signed:  Arletha Lady, MD           11/28/2023

## 2023-11-28 ENCOUNTER — Encounter: Payer: Self-pay | Admitting: Family Medicine

## 2023-11-28 ENCOUNTER — Ambulatory Visit (INDEPENDENT_AMBULATORY_CARE_PROVIDER_SITE_OTHER): Admitting: Family Medicine

## 2023-11-28 VITALS — BP 99/64 | HR 78 | Temp 98.4°F | Ht 62.0 in | Wt 145.6 lb

## 2023-11-28 DIAGNOSIS — M810 Age-related osteoporosis without current pathological fracture: Secondary | ICD-10-CM

## 2023-11-28 DIAGNOSIS — F4310 Post-traumatic stress disorder, unspecified: Secondary | ICD-10-CM | POA: Diagnosis not present

## 2023-11-28 DIAGNOSIS — E559 Vitamin D deficiency, unspecified: Secondary | ICD-10-CM

## 2023-11-28 DIAGNOSIS — F411 Generalized anxiety disorder: Secondary | ICD-10-CM | POA: Diagnosis not present

## 2023-11-28 DIAGNOSIS — E039 Hypothyroidism, unspecified: Secondary | ICD-10-CM | POA: Diagnosis not present

## 2023-11-28 DIAGNOSIS — Z91148 Patient's other noncompliance with medication regimen for other reason: Secondary | ICD-10-CM

## 2023-11-28 DIAGNOSIS — F3341 Major depressive disorder, recurrent, in partial remission: Secondary | ICD-10-CM | POA: Diagnosis not present

## 2023-11-28 MED ORDER — DULOXETINE HCL 20 MG PO CPEP: ORAL_CAPSULE | ORAL | 0 refills | Status: DC

## 2023-12-19 ENCOUNTER — Encounter (HOSPITAL_COMMUNITY): Payer: Self-pay

## 2023-12-20 ENCOUNTER — Other Ambulatory Visit: Payer: Self-pay | Admitting: Family Medicine

## 2023-12-26 ENCOUNTER — Other Ambulatory Visit (HOSPITAL_COMMUNITY): Payer: Self-pay

## 2023-12-26 ENCOUNTER — Encounter: Payer: Self-pay | Admitting: Family Medicine

## 2023-12-26 ENCOUNTER — Telehealth: Payer: Self-pay

## 2023-12-26 ENCOUNTER — Ambulatory Visit (INDEPENDENT_AMBULATORY_CARE_PROVIDER_SITE_OTHER): Admitting: Family Medicine

## 2023-12-26 VITALS — BP 115/68 | HR 90 | Temp 98.4°F | Ht 62.0 in | Wt 141.0 lb

## 2023-12-26 DIAGNOSIS — F5105 Insomnia due to other mental disorder: Secondary | ICD-10-CM | POA: Diagnosis not present

## 2023-12-26 DIAGNOSIS — F411 Generalized anxiety disorder: Secondary | ICD-10-CM

## 2023-12-26 DIAGNOSIS — F332 Major depressive disorder, recurrent severe without psychotic features: Secondary | ICD-10-CM

## 2023-12-26 DIAGNOSIS — F4310 Post-traumatic stress disorder, unspecified: Secondary | ICD-10-CM

## 2023-12-26 DIAGNOSIS — F418 Other specified anxiety disorders: Secondary | ICD-10-CM | POA: Diagnosis not present

## 2023-12-26 MED ORDER — LISDEXAMFETAMINE DIMESYLATE 20 MG PO CAPS
20.0000 mg | ORAL_CAPSULE | Freq: Every day | ORAL | 0 refills | Status: DC
Start: 2023-12-26 — End: 2023-12-27

## 2023-12-26 MED ORDER — DULOXETINE HCL 20 MG PO CPEP: ORAL_CAPSULE | ORAL | 1 refills | Status: DC

## 2023-12-26 NOTE — Telephone Encounter (Signed)
 FYI  Please see below
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
## 2023-12-26 NOTE — Telephone Encounter (Signed)
 Pharmacy Patient Advocate Encounter  PLEASE BE ADVISED Received notification from [employer] that Prior Authorization for Lisdexamfetamine Dimesylate 20MG  capsules  Is not COVERED due to NON-FORMULARY DRUG PT MUST HAVE TRIED AND FAILED METHYLPHENIDATE  HCL ATOMOXETINE, DEXTROAMPHETAMINE SULFATE, DEXMETHYLPHENIDATE, DEXTROAMPHETAMINE-AMPHETAMINE PA DENIED UNLESS PT HAS TRIED.     KEY [id number]

## 2023-12-27 MED ORDER — DEXMETHYLPHENIDATE HCL ER 10 MG PO CP24: 10.0000 mg | ORAL_CAPSULE | Freq: Every day | ORAL | 0 refills | Status: DC

## 2023-12-27 NOTE — Addendum Note (Signed)
 Addended by: Shelvia Dick on: 12/27/2023 12:26 PM   Modules accepted: Orders

## 2023-12-27 NOTE — Telephone Encounter (Signed)
 Pts husband has been made aware.

## 2023-12-27 NOTE — Telephone Encounter (Signed)
 Okay, dexmethylphenidate prescription sent.

## 2024-01-01 ENCOUNTER — Other Ambulatory Visit: Payer: Self-pay | Admitting: Family Medicine

## 2024-01-02 ENCOUNTER — Telehealth: Payer: Self-pay

## 2024-01-02 ENCOUNTER — Other Ambulatory Visit (HOSPITAL_COMMUNITY): Payer: Self-pay

## 2024-01-02 NOTE — Telephone Encounter (Signed)
 Pharmacy Patient Advocate Encounter   Received notification from Patient Pharmacy that prior authorization for Focalin  XR 10MG  is required/requested.   Insurance verification completed.   The patient is insured through [place] .   Per test claim:  Dextroamphetamine-Amphetamine, Methylphenidate  HCL, Atomoxetine, Dextroamphetamine Sulfate, Dexmethylphenidate  is preferred by the insurance.  If suggested medication is appropriate, Please send in a new RX and discontinue this one. If not, please advise as to why it's not appropriate so that we may request a Prior Authorization. Please note, some preferred medications may still require a PA.  If the suggested medications have not been trialed and there are no contraindications to their use, the PA will not be submitted, as it will not be approved.

## 2024-01-11 ENCOUNTER — Encounter: Payer: Self-pay | Admitting: Family Medicine

## 2024-01-11 ENCOUNTER — Ambulatory Visit: Admitting: Family Medicine

## 2024-01-11 VITALS — BP 124/76 | HR 78 | Temp 98.7°F | Ht 62.0 in | Wt 139.8 lb

## 2024-01-11 DIAGNOSIS — F411 Generalized anxiety disorder: Secondary | ICD-10-CM

## 2024-01-11 DIAGNOSIS — F332 Major depressive disorder, recurrent severe without psychotic features: Secondary | ICD-10-CM | POA: Diagnosis not present

## 2024-01-11 DIAGNOSIS — F5105 Insomnia due to other mental disorder: Secondary | ICD-10-CM

## 2024-01-11 DIAGNOSIS — F4310 Post-traumatic stress disorder, unspecified: Secondary | ICD-10-CM

## 2024-01-11 MED ORDER — LORAZEPAM 1 MG PO TABS: ORAL_TABLET | ORAL | 1 refills | Status: DC

## 2024-01-11 MED ORDER — PRAZOSIN HCL 1 MG PO CAPS: 1.0000 mg | ORAL_CAPSULE | Freq: Every day | ORAL | 0 refills | Status: DC

## 2024-01-11 NOTE — Progress Notes (Signed)
 OFFICE VISIT  01/11/2024  CC:  Chief Complaint  Patient presents with   Anxiety   Depression    2 week f/u    Patient is a 83 y.o. female who presents for 2-week follow-up depression, PTSD, anxiety. A/P as of last visit: "Recurrent major depressive disorder, PTSD, GAD. She has significant insomnia related to the above diagnoses. Will try a stimulant as augmentation to her antidepressant treatment. Vyvanse  20 mg, 1 daily.  Therapeutic expectations and side effect profile of medication discussed today.  Patient's questions answered. I discussed the fact that we do run the risk of worsening her insomnia with this medication but would like to see if it increases her focus and motivation and helps her mood. Stay on risperidone  1 mg nightly, lorazepam  1 mg daily, BuSpar  10 mg 3 times daily, and duloxetine  40 mg/day. She states she is not at the point of wanting to start therapy, states that her anxiety is too excessive at this time. Encouraged her to have a goal of getting out of her home 1 time in the next 2 weeks."  INTERIM HX: Insurance denied Vyvanse .  I prescribed Focalin  after that and it was not approved either. She states everything is the same regarding her depressed mood, high anxiety, poor sleep, frequent reliving of past traumatic events.  Denies HI or SI. She realized that she has not been taking Ativan  for quite a while (see PMPAWARE below).  She states she takes the Risperdal  1 mg around 9 or 10 PM and goes to sleep within a reasonable amount of time but wakes up around 3 AM and her mind will not shut down.  She gets frustrated and anxious and cries easily.  She is highly anxious because her son and 9-year-old grandson will be visiting from [place]  soon and she does not want be like this around them.  PMP AWARE reviewed today: most recent rx for lorazepam  was filled 11/29/22, # 30, rx by me. No red flags.   Past Medical History:  Diagnosis Date   Acquired  hypothyroidism    Anxiety and depression    with PTSD.  Dr. Stann Earnest ordered repeat MRI brain 2017 (no acute, no change from prior MR done in 2012) and referred pt to neuro for neuropsych testing but pt did not go.  Repeat MRI 10/2017 mild chronic microvasc isch change --stable compared to prior MRI.   Arthritis    Bilateral carpal tunnel syndrome    Cataracts, bilateral    Chronic renal insufficiency, stage III (moderate) (HCC)    Colitis    hx of   DDD (degenerative disc disease), lumbar 2016   L1-2 and L5-S1 fairly severe--intramuscular injection of steroid and toradol  by Dr. Ibazibo 02/24/14 to calm down the pain   Enterocele 05/2016   Dx'd by Dr. Annitta Kindler (GYN); he referred pt back to the MD at [facility] that did her prior bladder sling procedure.  No rectocele.   GERD (gastroesophageal reflux disease)    Goiter    Lobectomy (left) of thyroid  for benign nodule.  Right lobe nodules followed by Dr. Ronelle Coffee (FNA 2012 benign goiter) with ultrasounds and have been stable, most recently 01/10/13.     H/O adenomatous polyp of colon 2012; 05/2016   5 yr recall   Hypercalcemia 10/2017   suspected to be due to lithium  toxicity.   Hyperlipidemia    Hypertension    Lithium  toxicity 2019   Lumbar spondylolysis 2016   L5: with grade I spondylolisthesis L5 on S1  Osteoporosis 2012; 2018   2012-"penia".  2018 "porosis"--alendronate  started 12/2016.  Repeat DEXA 12/2018. DEXA 08/2021 T score -3.3   PFO (patent foramen ovale) echo 03/21/05   "hole in heart" saw Dr. Doria Garden [phone] [facility]. hospital.  Plavix  med mgmt.  Dr. Stann Earnest repeated echo w/bubble study 05/2016 and it showed NO PFO or ASD.   TIA (transient ischemic attack)    patient on plavix ,  saw Dr. Durenda Gilford at [facility].hospital in [place].  MRI brain 05/2016: mild chronic small vessel dz, o/w normal.   Urinary tract infection    hx of   Vertigo    Vitamin D  deficiency     Past Surgical History:  Procedure Laterality Date    ABDOMINAL HYSTERECTOMY     BLADDER SUSPENSION     BREAST EXCISIONAL BIOPSY Right 1960   BREAST EXCISIONAL BIOPSY Left    BREAST SURGERY     "breast lumps removed"   COLONOSCOPY W/ POLYPECTOMY  04/2011; 05/29/16   +Diverticulosis and int hem.  Adenomatous polyp 2012.  No polyps on repeat TCS 05/2016--recall 5 yrs (05/2021)   DEXA  05/23/2011   2012 T-score -2.2. 12/2016 T-score -2.6.  08/2021 T score -3.3   DILATION AND CURETTAGE OF UTERUS     diverticulosis  05/2016   Noted on colonoscopy   RECTOCELE REPAIR     purse string   THYROIDECTOMY     Left lobectomy   TOTAL KNEE ARTHROPLASTY  07/26/2012   Procedure: TOTAL KNEE ARTHROPLASTY;  Surgeon: Forbes Ida., MD;  Location: [facility] OR;  Service: Orthopedics;  Laterality: Right;   TRANSTHORACIC ECHOCARDIOGRAM  05/24/2016   EF 60-65%, normal LV function.  No DD. No PFO or ASD (bubble study WAS done).    Outpatient Medications Prior to Visit  Medication Sig Dispense Refill   alendronate  (FOSAMAX ) 70 MG tablet Take 1 tablet (70 mg total) by mouth once a week. 12 tablet 3   amLODipine  (NORVASC ) 10 MG tablet Take 1 tablet (10 mg total) by mouth daily. 90 tablet 1   aspirin  EC 81 MG tablet Take 1 tablet (81 mg total) by mouth daily. 30 tablet 0   atorvastatin  (LIPITOR) 20 MG tablet Take 1 tablet by mouth once daily 90 tablet 0   busPIRone  (BUSPAR ) 10 MG tablet Take 1 tablet (10 mg total) by mouth 3 (three) times daily. 270 tablet 1   Cholecalciferol  (VITAMIN D -3 PO) Take by mouth daily.     DULoxetine  (CYMBALTA ) 20 MG capsule 2 tabs po qd 180 capsule 1   levothyroxine  (SYNTHROID ) 50 MCG tablet Take 1 tablet (50 mcg total) by mouth daily before breakfast. 90 tablet 3   Multiple Vitamin (MV-ONE) CAPS Take 1 capsule by mouth daily.     Omega-3 Fatty Acids (FISH OIL) 500 MG CAPS Take 1 capsule by mouth daily.     risperiDONE  (RISPERDAL  M-TABS) 1 MG disintegrating tablet Take 1 tablet (1 mg total) by mouth at bedtime. 90 tablet 1   Vitamin D ,  Ergocalciferol , (DRISDOL ) 1.25 MG (50000 UNIT) CAPS capsule Take 1 capsule (50,000 Units total) by mouth 2 (two) times a week. 24 capsule 1   LORazepam  (ATIVAN ) 1 MG tablet 1 tab po qd 30 tablet 1   dexmethylphenidate  (FOCALIN  XR) 10 MG 24 hr capsule Take 1 capsule (10 mg total) by mouth daily. (Patient not taking: Reported on 01/11/2024) 30 capsule 0   No facility-administered medications prior to visit.    Allergies  Allergen Reactions   Lamictal  [Lamotrigine ]  Other (See Comments)   Lithium  Other (See Comments)    toxicity   Phenylephrine  Rash    Patient had allergic reaction to dilation combo drop. Please dilate with Tropicamide 0.5% only with Fluress and/or Proparacaine    Sulfa Drugs Cross Reactors Rash    Review of Systems As per HPI  PE:    01/11/2024   10:11 AM 12/26/2023    2:30 PM 11/28/2023    9:53 AM  Vitals with BMI  Height 5\' 2"  5\' 2"  5\' 2"   Weight 139 lbs 13 oz 141 lbs 145 lbs 10 oz  BMI 25.56 25.78 26.62  Systolic 124 115 99  Diastolic 76 68 64  Pulse 78 90 78     Physical Exam  Gen: Alert, well appearing.  Patient is oriented to person, place, time, and situation. Affect: Anxious, frequently tearful. No further exam today  LABS:  Last CBC Lab Results  Component Value Date   WBC 6.3 03/17/2021   HGB 13.8 03/17/2021   HCT 42.2 03/17/2021   MCV 89.8 03/17/2021   MCH 29.3 07/28/2012   RDW 13.7 03/17/2021   PLT 207.0 03/17/2021   Last metabolic panel Lab Results  Component Value Date   GLUCOSE 71 10/31/2023   NA 142 10/31/2023   K 4.5 10/31/2023   CL 104 10/31/2023   CO2 30 10/31/2023   BUN 16 10/31/2023   CREATININE 0.91 10/31/2023   GFR 59.68 (L) 10/31/2023   CALCIUM  11.0 (H) 10/31/2023   PHOS 2.9 07/02/2015   PROT 6.6 10/31/2023   ALBUMIN 4.5 10/31/2023   BILITOT 0.9 10/31/2023   ALKPHOS 97 10/31/2023   AST 20 10/31/2023   ALT 20 10/31/2023   Last lipids Lab Results  Component Value Date   CHOL 190 10/31/2023   HDL 64.40  10/31/2023   LDLCALC 105 (H) 10/31/2023   TRIG 103.0 10/31/2023   CHOLHDL 3 10/31/2023   Last thyroid  functions Lab Results  Component Value Date   TSH 5.99 (H) 10/31/2023   T3TOTAL 94 10/22/2017   IMPRESSION AND PLAN:  Recurrent major depressive disorder, PTSD, GAD. She has significant insomnia related to the above diagnoses. At last visit our plan was to add a stimulant for augmentation of her antidepressant but insurance denied. She has been off her lorazepam  for over a year now and we need to get this restarted--> 1 mg nightly. Additionally, will start prazosin 1 mg nightly.  We can push the dose up to 3 mg nightly in the future if needed. Continue all current meds as is. I once again encouraged her to reestablish long-term counseling.  She will think about this and we will try to get this going when I see her again in approximately 2 to 4 weeks.  Spent 32 min with pt today reviewing HPI, reviewing relevant past history, doing exam, reviewing and discussing lab and imaging data, and formulating plans.  An After Visit Summary was printed and given to the patient.  FOLLOW UP: Return for f/u 2-4 wks when convenient for patient.  Signed:  Arletha Lady, MD           01/11/2024

## 2024-01-11 NOTE — Patient Instructions (Signed)
 Start taking lorazepam  1 mg at bedtime every night.  Also start taking prazosin 1mg  every night at bedtime.  Otherwise continue all current medications as is.  Consider restarting long term counseling

## 2024-01-12 ENCOUNTER — Other Ambulatory Visit: Payer: Self-pay | Admitting: Family Medicine

## 2024-01-16 ENCOUNTER — Telehealth: Payer: Self-pay

## 2024-01-16 ENCOUNTER — Other Ambulatory Visit (HOSPITAL_COMMUNITY): Payer: Self-pay

## 2024-01-16 NOTE — Telephone Encounter (Signed)
 Pharmacy Patient Advocate Encounter   Received notification from Onbase that prior authorization for Vyvanse  20 caps is required/requested.   Insurance verification completed.   The patient is insured through [employer] .   Per test claim:  (see Below) is preferred by the insurance.  If suggested medication is appropriate, Please send in a new RX and discontinue this one. If not, please advise as to why it's not appropriate so that we may request a Prior Authorization. Please note, some preferred medications may still require a PA.  If the suggested medications have not been trialed and there are no contraindications to their use, the PA will not be submitted, as it will not be approved.

## 2024-02-05 ENCOUNTER — Other Ambulatory Visit: Payer: Self-pay | Admitting: Family Medicine

## 2024-03-03 ENCOUNTER — Other Ambulatory Visit: Payer: Self-pay | Admitting: Family Medicine

## 2024-03-04 ENCOUNTER — Other Ambulatory Visit: Payer: Self-pay | Admitting: Family Medicine

## 2024-03-27 ENCOUNTER — Other Ambulatory Visit: Payer: Self-pay | Admitting: Family Medicine

## 2024-04-02 ENCOUNTER — Other Ambulatory Visit: Payer: Self-pay | Admitting: Family Medicine

## 2024-04-05 ENCOUNTER — Other Ambulatory Visit: Payer: Self-pay | Admitting: Family Medicine

## 2024-04-13 ENCOUNTER — Other Ambulatory Visit: Payer: Self-pay | Admitting: Family Medicine

## 2024-04-17 ENCOUNTER — Encounter: Payer: Self-pay | Admitting: Family Medicine

## 2024-04-17 ENCOUNTER — Ambulatory Visit: Payer: Self-pay | Admitting: Family Medicine

## 2024-04-17 ENCOUNTER — Ambulatory Visit (INDEPENDENT_AMBULATORY_CARE_PROVIDER_SITE_OTHER): Admitting: Family Medicine

## 2024-04-17 VITALS — BP 108/65 | HR 78 | Temp 97.2°F | Ht 62.0 in | Wt 132.0 lb

## 2024-04-17 DIAGNOSIS — E039 Hypothyroidism, unspecified: Secondary | ICD-10-CM

## 2024-04-17 DIAGNOSIS — I10 Essential (primary) hypertension: Secondary | ICD-10-CM | POA: Diagnosis not present

## 2024-04-17 LAB — TSH: TSH: 1.91 u[IU]/mL (ref 0.35–5.50)

## 2024-04-17 LAB — BASIC METABOLIC PANEL WITH GFR
BUN: 17 mg/dL (ref 6–23)
CO2: 27 meq/L (ref 19–32)
Calcium: 10.5 mg/dL (ref 8.4–10.5)
Chloride: 105 meq/L (ref 96–112)
Creatinine, Ser: 0.97 mg/dL (ref 0.40–1.20)
GFR: 55.1 mL/min — ABNORMAL LOW (ref >60.00)
Glucose, Bld: 88 mg/dL (ref 70–99)
Potassium: 4.6 meq/L (ref 3.5–5.1)
Sodium: 141 meq/L (ref 135–145)

## 2024-04-17 MED ORDER — PRAZOSIN HCL 1 MG PO CAPS: 1.0000 mg | ORAL_CAPSULE | Freq: Every day | ORAL | 3 refills | Status: AC

## 2024-04-17 MED ORDER — AMLODIPINE BESYLATE 10 MG PO TABS: 10.0000 mg | ORAL_TABLET | Freq: Every day | ORAL | 3 refills | Status: AC

## 2024-04-17 NOTE — Progress Notes (Signed)
 OFFICE VISIT  04/17/2024  CC:  Chief Complaint  Patient presents with   Medical Management of Chronic Issues    Patient is a 83 y.o. female who presents for 62-month follow-up depression, anxiety, and PTSD.  She has significant insomnia related to the above diagnoses. At last visit our plan was to get her back on lorazepam , 1 mg every night. Additionally, I started prazosin  1 mg nightly, with the plan of pushing up to 3 mg nightly in the future if needed.  I also encouraged her to reestablish long-term counseling.  INTERIM HX: She feels like she is improving some. She is sleeping better with use of prazosin . She does not take her psychotropic medications with strict regularity. Has crying spells about 1 time a week, still not venturing out much at all.  She is looking forward to going to a wedding tomorrow.  PMP AWARE reviewed today: most recent rx for lorazepam  1mg  was filled 04/02/24, # 30, rx by me. No red flags.  Past Medical History:  Diagnosis Date   Acquired hypothyroidism    Anxiety and depression    with PTSD.  Dr. Delford ordered repeat MRI brain 2017 (no acute, no change from prior MR done in 2012) and referred pt to neuro for neuropsych testing but pt did not go.  Repeat MRI 10/2017 mild chronic microvasc isch change --stable compared to prior MRI.   Arthritis    Bilateral carpal tunnel syndrome    Cataracts, bilateral    Chronic renal insufficiency, stage III (moderate) (HCC)    Colitis    hx of   DDD (degenerative disc disease), lumbar 2016   L1-2 and L5-S1 fairly severe--intramuscular injection of steroid and toradol  by Dr. Ibazibo 02/24/14 to calm down the pain   Enterocele 05/2016   Dx'd by Dr. Winfred (GYN); he referred pt back to the MD at [facility] that did her prior bladder sling procedure.  No rectocele.   GERD (gastroesophageal reflux disease)    Goiter    Lobectomy (left) of thyroid  for benign nodule.  Right lobe nodules followed by Dr. Tommas (FNA 2012 benign  goiter) with ultrasounds and have been stable, most recently 01/10/13.     H/O adenomatous polyp of colon 2012; 05/2016   5 yr recall   Hypercalcemia 10/2017   suspected to be due to lithium  toxicity.   Hyperlipidemia    Hypertension    Lithium  toxicity 2019   Lumbar spondylolysis 2016   L5: with grade I spondylolisthesis L5 on S1   Osteoporosis 2012; 2018   2012-penia.  2018 porosis--alendronate  started 12/2016.  Repeat DEXA 12/2018. DEXA 08/2021 T score -3.3   PFO (patent foramen ovale) echo 03/21/05   hole in heart saw Dr. Norleen Kling [phone] [facility]. hospital.  Plavix  med mgmt.  Dr. Delford repeated echo w/bubble study 05/2016 and it showed NO PFO or ASD.   TIA (transient ischemic attack)    patient on plavix ,  saw Dr. Kling at [facility].hospital in [place].  MRI brain 05/2016: mild chronic small vessel dz, o/w normal.   Urinary tract infection    hx of   Vertigo    Vitamin D  deficiency     Past Surgical History:  Procedure Laterality Date   ABDOMINAL HYSTERECTOMY     BLADDER SUSPENSION     BREAST EXCISIONAL BIOPSY Right 1960   BREAST EXCISIONAL BIOPSY Left    BREAST SURGERY     breast lumps removed   COLONOSCOPY W/ POLYPECTOMY  04/2011; 05/29/16   +  Diverticulosis and int hem.  Adenomatous polyp 2012.  No polyps on repeat TCS 05/2016--recall 5 yrs (05/2021)   DEXA  05/23/2011   2012 T-score -2.2. 12/2016 T-score -2.6.  08/2021 T score -3.3   DILATION AND CURETTAGE OF UTERUS     diverticulosis  05/2016   Noted on colonoscopy   RECTOCELE REPAIR     purse string   THYROIDECTOMY     Left lobectomy   TOTAL KNEE ARTHROPLASTY  07/26/2012   Procedure: TOTAL KNEE ARTHROPLASTY;  Surgeon: LELON JONETTA Shari Mickey., MD;  Location: [facility] OR;  Service: Orthopedics;  Laterality: Right;   TRANSTHORACIC ECHOCARDIOGRAM  05/24/2016   EF 60-65%, normal LV function.  No DD. No PFO or ASD (bubble study WAS done).    Outpatient Medications Prior to Visit  Medication Sig Dispense Refill    alendronate  (FOSAMAX ) 70 MG tablet Take 1 tablet (70 mg total) by mouth once a week. 12 tablet 3   aspirin  EC 81 MG tablet Take 1 tablet (81 mg total) by mouth daily. 30 tablet 0   atorvastatin  (LIPITOR) 20 MG tablet Take 1 tablet by mouth once daily 90 tablet 0   busPIRone  (BUSPAR ) 10 MG tablet Take 1 tablet (10 mg total) by mouth 3 (three) times daily. 270 tablet 1   Cholecalciferol  (VITAMIN D -3 PO) Take by mouth daily.     levothyroxine  (SYNTHROID ) 50 MCG tablet Take 1 tablet (50 mcg total) by mouth daily before breakfast. 90 tablet 3   LORazepam  (ATIVAN ) 1 MG tablet 1 tab po qd 90 tablet 1   Multiple Vitamin (MV-ONE) CAPS Take 1 capsule by mouth daily.     Omega-3 Fatty Acids (FISH OIL) 500 MG CAPS Take 1 capsule by mouth daily.     risperiDONE  (RISPERDAL  M-TABS) 1 MG disintegrating tablet Take 1 tablet (1 mg total) by mouth at bedtime. 90 tablet 1   DULoxetine  (CYMBALTA ) 20 MG capsule 2 tabs po qd (Patient not taking: Reported on 04/17/2024) 180 capsule 1   Vitamin D , Ergocalciferol , (DRISDOL ) 1.25 MG (50000 UNIT) CAPS capsule Take 1 capsule (50,000 Units total) by mouth 2 (two) times a week. (Patient not taking: Reported on 04/17/2024) 24 capsule 1   amLODipine  (NORVASC ) 10 MG tablet TAKE 1 TABLET BY MOUTH ONCE DAILY . APPOINTMENT REQUIRED FOR FUTURE REFILLS 30 tablet 0   prazosin  (MINIPRESS ) 1 MG capsule Take 1 capsule by mouth at bedtime 30 capsule 0   No facility-administered medications prior to visit.    Allergies  Allergen Reactions   Lamictal  [Lamotrigine ] Other (See Comments)   Lithium  Other (See Comments)    toxicity   Phenylephrine  Rash    Patient had allergic reaction to dilation combo drop. Please dilate with Tropicamide 0.5% only with Fluress and/or Proparacaine    Sulfa Drugs Cross Reactors Rash    Review of Systems As per HPI  PE:    04/17/2024   10:21 AM 01/11/2024   10:11 AM 12/26/2023    2:30 PM  Vitals with BMI  Height 5' 2 5' 2 5' 2  Weight 132 lbs 139  lbs 13 oz 141 lbs  BMI 24.14 25.56 25.78  Systolic 108 124 884  Diastolic 65 76 68  Pulse 78 78 90     Physical Exam  Gen: Alert, well appearing.  Patient is oriented to person, place, time, and situation. AFFECT: A bit anxious, flight back tears occasionally.  She is very pleasant, has lucid thought and speech. CV: RRR, no m/r/g.   LUNGS:  CTA bilat, nonlabored resps, good aeration in all lung fields. EXT: no clubbing or cyanosis.  no edema.    LABS:  Last CBC Lab Results  Component Value Date   WBC 6.3 03/17/2021   HGB 13.8 03/17/2021   HCT 42.2 03/17/2021   MCV 89.8 03/17/2021   MCH 29.3 07/28/2012   RDW 13.7 03/17/2021   PLT 207.0 03/17/2021   Last metabolic panel Lab Results  Component Value Date   GLUCOSE 71 10/31/2023   NA 142 10/31/2023   K 4.5 10/31/2023   CL 104 10/31/2023   CO2 30 10/31/2023   BUN 16 10/31/2023   CREATININE 0.91 10/31/2023   GFR 59.68 (L) 10/31/2023   CALCIUM  11.0 (H) 10/31/2023   PHOS 2.9 07/02/2015   PROT 6.6 10/31/2023   ALBUMIN 4.5 10/31/2023   BILITOT 0.9 10/31/2023   ALKPHOS 97 10/31/2023   AST 20 10/31/2023   ALT 20 10/31/2023   Lab Results  Component Value Date   CHOL 190 10/31/2023   HDL 64.40 10/31/2023   LDLCALC 105 (H) 10/31/2023   TRIG 103.0 10/31/2023   CHOLHDL 3 10/31/2023   Last thyroid  functions Lab Results  Component Value Date   TSH 5.99 (H) 10/31/2023   T3TOTAL 94 10/22/2017  Last vitamin D  Lab Results  Component Value Date   VD25OH 28.32 (L) 10/31/2023   IMPRESSION AND PLAN:  #1 hypertension, well-controlled on amlodipine  10 mg a day. Basic metabolic panel today.  #2 recurrent major depressive disorder, PTSD, GAD. Doing reasonably well lately.  Encouraged her to take her medication regularly---> duloxetine  40 mg a day, buspirone  10 mg 3 times a day, lorazepam  1 mg daily, prazosin  1 mg nightly, and risperidone  1 mg nightly.  #3 hypothyroidism, currently taking 50 mcg levothyroxine  daily. Monitor  TSH today.  4.  Vitamin D  deficiency. About 6 months ago we increased her vitamin D  to 50K units twice per week.  It is not clear that she is taking this. Recheck vitamin D  level at next follow-up in 3 months.  #5 Hypercholesterolemia: Fairly well-controlled on atorvastatin  20 mg a day. LDL was 105 about 3 months ago.  I did not change her dose. Recheck cholesterol in 6 months and if LDL not down under 100 will increase to 40 mg daily.  An After Visit Summary was printed and given to the patient.  FOLLOW UP: Return in about 3 months (around 07/17/2024) for routine chronic illness f/u.  Signed:  Gerlene Hockey, MD           04/17/2024

## 2024-04-23 ENCOUNTER — Ambulatory Visit (INDEPENDENT_AMBULATORY_CARE_PROVIDER_SITE_OTHER): Admitting: *Deleted

## 2024-04-23 VITALS — Ht 62.0 in | Wt 132.0 lb

## 2024-04-23 DIAGNOSIS — Z0000 Encounter for general adult medical examination without abnormal findings: Secondary | ICD-10-CM

## 2024-04-23 DIAGNOSIS — Z780 Asymptomatic menopausal state: Secondary | ICD-10-CM

## 2024-04-23 NOTE — Progress Notes (Signed)
 Subjective:   Carla Spencer is a 83 y.o. female who presents for Medicare Annual (Subsequent) preventive examination.  Visit Complete: Virtual I connected with  Carla Spencer on 04/23/24 by a audio enabled telemedicine application and verified that I am speaking with the correct person using two identifiers.  Patient Location: Home  Provider Location: Home Office  I discussed the limitations of evaluation and management by telemedicine. The patient expressed understanding and agreed to proceed.  Vital Signs: Because this visit was a virtual/telehealth visit, some criteria may be missing or patient reported. Any vitals not documented were not able to be obtained and vitals that have been documented are patient reported.        Objective:    There were no vitals filed for this visit. There is no height or weight on file to calculate BMI.     04/23/2024    1:17 PM 04/11/2023   12:59 PM 03/29/2022    1:10 PM 03/02/2021   11:27 AM 07/26/2012    6:12 AM 07/19/2012   10:20 AM  Advanced Directives  Does Patient Have a Medical Advance Directive? Yes Yes Yes Yes  Patient does not have advance directive   Type of Advance Directive Healthcare Power of [employer] of [place];Living will Healthcare Power of [employer] of [place];Living will    Does patient want to make changes to medical advance directive?  No - Patient declined      Copy of Healthcare Power of Attorney in Chart? No - copy requested Yes - validated most recent copy scanned in chart (See row information) No - copy requested Yes - validated most recent copy scanned in chart (See row information)    Pre-existing out of facility DNR order (yellow form or pink MOST form)     No       Data saved with a previous flowsheet row definition    Current Medications (verified) Outpatient Encounter Medications as of 04/23/2024  Medication Sig   alendronate  (FOSAMAX ) 70 MG tablet Take 1 tablet (70 mg total)  by mouth once a week.   amLODipine  (NORVASC ) 10 MG tablet Take 1 tablet (10 mg total) by mouth daily.   aspirin  EC 81 MG tablet Take 1 tablet (81 mg total) by mouth daily.   atorvastatin  (LIPITOR) 20 MG tablet Take 1 tablet by mouth once daily   busPIRone  (BUSPAR ) 10 MG tablet Take 1 tablet (10 mg total) by mouth 3 (three) times daily.   Cholecalciferol  (VITAMIN D -3 PO) Take by mouth daily.   DULoxetine  (CYMBALTA ) 20 MG capsule 2 tabs po qd (Patient not taking: Reported on 04/17/2024)   levothyroxine  (SYNTHROID ) 50 MCG tablet Take 1 tablet (50 mcg total) by mouth daily before breakfast.   LORazepam  (ATIVAN ) 1 MG tablet 1 tab po qd   Multiple Vitamin (MV-ONE) CAPS Take 1 capsule by mouth daily.   Omega-3 Fatty Acids (FISH OIL) 500 MG CAPS Take 1 capsule by mouth daily.   prazosin  (MINIPRESS ) 1 MG capsule Take 1 capsule (1 mg total) by mouth at bedtime.   risperiDONE  (RISPERDAL  M-TABS) 1 MG disintegrating tablet Take 1 tablet (1 mg total) by mouth at bedtime.   Vitamin D , Ergocalciferol , (DRISDOL ) 1.25 MG (50000 UNIT) CAPS capsule Take 1 capsule (50,000 Units total) by mouth 2 (two) times a week. (Patient not taking: Reported on 04/17/2024)   No facility-administered encounter medications on file as of 04/23/2024.    Allergies (verified) Lamictal  [lamotrigine ], Lithium , Phenylephrine , and Sulfa drugs  cross reactors   History: Past Medical History:  Diagnosis Date   Acquired hypothyroidism    Anxiety and depression    with PTSD.  Dr. Delford ordered repeat MRI brain 2017 (no acute, no change from prior MR done in 2012) and referred pt to neuro for neuropsych testing but pt did not go.  Repeat MRI 10/2017 mild chronic microvasc isch change --stable compared to prior MRI.   Arthritis    Bilateral carpal tunnel syndrome    Cataracts, bilateral    Chronic renal insufficiency, stage III (moderate) (HCC)    Colitis    hx of   DDD (degenerative disc disease), lumbar 2016   L1-2 and L5-S1 fairly  severe--intramuscular injection of steroid and toradol  by Dr. Ibazibo 02/24/14 to calm down the pain   Enterocele 05/2016   Dx'd by Dr. Winfred (GYN); he referred pt back to the MD at [facility] that did her prior bladder sling procedure.  No rectocele.   GERD (gastroesophageal reflux disease)    Goiter    Lobectomy (left) of thyroid  for benign nodule.  Right lobe nodules followed by Dr. Tommas (FNA 2012 benign goiter) with ultrasounds and have been stable, most recently 01/10/13.     H/O adenomatous polyp of colon 2012; 05/2016   5 yr recall   Hypercalcemia 10/2017   suspected to be due to lithium  toxicity.   Hyperlipidemia    Hypertension    Lithium  toxicity 2019   Lumbar spondylolysis 2016   L5: with grade I spondylolisthesis L5 on S1   Osteoporosis 2012; 2018   2012-penia.  2018 porosis--alendronate  started 12/2016.  Repeat DEXA 12/2018. DEXA 08/2021 T score -3.3   PFO (patent foramen ovale) echo 03/21/05   hole in heart saw Dr. Norleen Kling [phone] [facility]. hospital.  Plavix  med mgmt.  Dr. Delford repeated echo w/bubble study 05/2016 and it showed NO PFO or ASD.   TIA (transient ischemic attack)    patient on plavix ,  saw Dr. Kling at [facility].hospital in [place].  MRI brain 05/2016: mild chronic small vessel dz, o/w normal.   Urinary tract infection    hx of   Vertigo    Vitamin D  deficiency    Past Surgical History:  Procedure Laterality Date   ABDOMINAL HYSTERECTOMY     BLADDER SUSPENSION     BREAST EXCISIONAL BIOPSY Right 1960   BREAST EXCISIONAL BIOPSY Left    BREAST SURGERY     breast lumps removed   COLONOSCOPY W/ POLYPECTOMY  04/2011; 05/29/16   +Diverticulosis and int hem.  Adenomatous polyp 2012.  No polyps on repeat TCS 05/2016--recall 5 yrs (05/2021)   DEXA  05/23/2011   2012 T-score -2.2. 12/2016 T-score -2.6.  08/2021 T score -3.3   DILATION AND CURETTAGE OF UTERUS     diverticulosis  05/2016   Noted on colonoscopy   RECTOCELE REPAIR     purse  string   THYROIDECTOMY     Left lobectomy   TOTAL KNEE ARTHROPLASTY  07/26/2012   Procedure: TOTAL KNEE ARTHROPLASTY;  Surgeon: LELON JONETTA Shari Mickey., MD;  Location: [facility] OR;  Service: Orthopedics;  Laterality: Right;   TRANSTHORACIC ECHOCARDIOGRAM  05/24/2016   EF 60-65%, normal LV function.  No DD. No PFO or ASD (bubble study WAS done).   Family History  Problem Relation Age of Onset   Heart disease Mother    Stomach cancer Father    Cancer Father    Colon polyps Daughter    Diabetes Cousin  Colon cancer Neg Hx    Social History   Socioeconomic History   Marital status: Married    Spouse name: Not on file   Number of children: 4   Years of education: Not on file   Highest education level: Not on file  Occupational History   Occupation: Retired  Tobacco Use   Smoking status: Former   Smokeless tobacco: Never   Tobacco comments:    smoked 1/4 of pack from 1961-1967  Vaping Use   Vaping status: Never Used  Substance and Sexual Activity   Alcohol use: No   Drug use: No   Sexual activity: Not Currently  Other Topics Concern   Not on file  Social History Narrative   Married, 4 children.   Orig from [place], relocated [facility] [place].   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  Retired in her 14s.   Volunteered a lot.   Tob: minimal, quit 1969.   Alc: none   Social Drivers of Corporate investment banker Strain: Low Risk  (04/11/2023)   Overall Financial Resource Strain (CARDIA)    Difficulty of Paying Living Expenses: Not hard at all  Food Insecurity: No Food Insecurity (04/11/2023)   Hunger Vital Sign    Worried About Running Out of Food in the Last Year: Never true    Ran Out of Food in the Last Year: Never true  Transportation Needs: No Transportation Needs (04/11/2023)   PRAPARE - Administrator, Civil Service (Medical): No    Lack of Transportation (Non-Medical): No  Physical Activity: Inactive (04/11/2023)   Exercise Vital Sign    Days of  Exercise per Week: 0 days    Minutes of Exercise per Session: 0 min  Stress: No Stress Concern Present (04/11/2023)   [employer] of Occupational Health - Occupational Stress Questionnaire    Feeling of Stress : Not at all  Social Connections: Moderately Isolated (04/11/2023)   Social Connection and Isolation Panel    Frequency of Communication with Friends and Family: More than three times a week    Frequency of Social Gatherings with Friends and Family: Once a week    Attends Religious Services: Never    Database administrator or Organizations: No    Attends Banker Meetings: Never    Marital Status: Married    Tobacco Counseling Counseling given: Not Answered Tobacco comments: smoked 1/4 of pack from 1961-1967   Clinical Intake:  Pre-visit preparation completed: Yes  Pain : No/denies pain     Diabetes: No  How often do you need to have someone help you when you read instructions, pamphlets, or other written materials from your doctor or pharmacy?: 1 - Never  Interpreter Needed?: No  Information entered by :: Mliss Graff LPN   Activities of Daily Living     No data to display          Patient Care Team: McGowen, Aleene DEL, MD as PCP - General (Family Medicine) Margeret Kotyk, MD as Consulting Physician (Physical Medicine and Rehabilitation) Geralene Kaiser, MD as Consulting Physician (Psychiatry) Aneita Gwendlyn DASEN, MD (Inactive) as Consulting Physician (Gastroenterology) Shari Sieving, MD as Consulting Physician (Orthopedic Surgery) Renate Lynwood Hussar, MD as Consulting Physician (Ophthalmology) Delford Maude BROCKS, MD as Consulting Physician (Cardiology) Winfred Curlee DEL, MD (Inactive) as Consulting Physician (Gynecology)  Indicate any recent Medical Services you may have received from other than [facility] providers in the past year (date may be approximate).  Assessment:   This is a routine wellness examination for [facility].  Hearing/Vision  screen Hearing Screening - Comments:: No trouble hearing Vision Screening - Comments:: Up to date Unsure of name   Goals Addressed   None    Depression Screen    04/17/2024   10:32 AM 11/28/2023   10:00 AM 10/31/2023   11:10 AM 05/02/2023   10:24 AM 04/11/2023   12:58 PM 01/30/2023    9:47 AM 12/29/2022   10:20 AM  PHQ 2/9 Scores  PHQ - 2 Score 2 2 3 2  0 2 2  PHQ- 9 Score 4 5 7 7  7 7     Fall Risk    04/23/2024    1:17 PM 04/17/2024   10:34 AM 11/28/2023    9:58 AM 10/31/2023   11:10 AM 05/02/2023   10:24 AM  Fall Risk   Falls in the past year? 0 0 0 0 0  Number falls in past yr: 0 0 0 0 0  Injury with Fall? 0 0 0 0 0  Risk for fall due to :  No Fall Risks No Fall Risks;Impaired vision  No Fall Risks  Follow up Falls evaluation completed;Education provided;Falls prevention discussed Falls evaluation completed Falls evaluation completed Falls evaluation completed Falls evaluation completed    MEDICARE RISK AT HOME: Medicare Risk at Home Any stairs in or around the home?: Yes If so, are there any without handrails?: No Home free of loose throw rugs in walkways, pet beds, electrical cords, etc?: Yes Adequate lighting in your home to reduce risk of falls?: Yes Life alert?: No Use of a cane, walker or w/c?: No Grab bars in the bathroom?: No Shower chair or bench in shower?: Yes Elevated toilet seat or a handicapped toilet?: No  TIMED UP AND GO:  Was the test performed?  No    Cognitive Function:        04/23/2024    1:17 PM 04/11/2023    9:11 AM 03/29/2022    1:14 PM  6CIT Screen  What Year? 4 points 0 points 0 points  What month? 3 points 0 points 0 points  What time? 0 points 0 points 0 points  Count back from 20 2 points 0 points 0 points  Months in reverse 2 points 0 points 0 points  Repeat phrase 2 points 0 points 0 points  Total Score 13 points 0 points 0 points    Immunizations Immunization History  Administered Date(s) Administered   Fluad Quad(high Dose  65+) 05/29/2019, 06/10/2020, 06/21/2021, 05/31/2022   Fluad Trivalent(High Dose 65+) 10/31/2023   INFLUENZA, HIGH DOSE SEASONAL PF 06/06/2013, 06/05/2017, 05/31/2018   Influenza,inj,Quad PF,6+ Mos 04/20/2015, 05/17/2016   PFIZER(Purple Top)SARS-COV-2 Vaccination 09/18/2019, 10/13/2019, 06/29/2020   Pneumococcal Conjugate-13 12/24/2013   Pneumococcal Polysaccharide-23 04/20/2015   Tdap 04/20/2015   Zoster Recombinant(Shingrix ) 04/13/2022, 07/21/2022   Zoster, Live 07/04/2011    TDAP status: Up to date  Flu Vaccine status: Up to date  Pneumococcal vaccine status: Up to date  Covid-19 vaccine status: Information provided on how to obtain vaccines.   Qualifies for Shingles Vaccine? No   Zostavax completed Yes   Shingrix  Completed?: Yes  Screening Tests Health Maintenance  Topic Date Due   Colonoscopy  05/29/2021   COVID-19 Vaccine (4 - 2025-26 season) 05/03/2024 (Originally 04/14/2024)   Influenza Vaccine  11/11/2024 (Originally 03/14/2024)   MAMMOGRAM  11/13/2024   DTaP/Tdap/Td (2 - Td or Tdap) 04/19/2025   Medicare Annual Wellness (AWV)  04/23/2025  Pneumococcal Vaccine: 50+ Years  Completed   DEXA SCAN  Completed   Zoster Vaccines- Shingrix   Completed   HPV VACCINES  Aged Out   Meningococcal B Vaccine  Aged Out    Health Maintenance  Health Maintenance Due  Topic Date Due   Colonoscopy  05/29/2021    Colorectal cancer screening: No longer required.   Mammogram   completed  Bone Density status: Ordered  . Pt provided with contact info and advised to call to schedule appt.  Lung Cancer Screening: (Low Dose CT Chest recommended if Age 35-80 years, 20 pack-year currently smoking OR have quit w/in 15years.) does not qualify.   Lung Cancer Screening Referral:   Additional Screening:  Hepatitis C Screening: does not qualify;   Vision Screening: Recommended annual ophthalmology exams for early detection of glaucoma and other disorders of the eye. Is the patient up to  date with their annual eye exam?  Yes  Who is the provider or what is the name of the office in which the patient attends annual eye exams? Unsure of name If pt is not established with a provider, would they like to be referred to a provider to establish care? No .   Dental Screening: Recommended annual dental exams for proper oral hygiene    Community Resource Referral / Chronic Care Management: CRR required this visit?  No   CCM required this visit?  No     Plan:     I have personally reviewed and noted the following in the patient's chart:   Medical and social history Use of alcohol, tobacco or illicit drugs  Current medications and supplements including opioid prescriptions. Patient is not currently taking opioid prescriptions. Functional ability and status Nutritional status Physical activity Advanced directives List of other physicians Hospitalizations, surgeries, and ER visits in previous 12 months Vitals Screenings to include cognitive, depression, and falls Referrals and appointments  In addition, I have reviewed and discussed with patient certain preventive protocols, quality metrics, and best practice recommendations. A written personalized care plan for preventive services as well as general preventive health recommendations were provided to patient.     Mliss Graff, LPN   [id number]   After Visit Summary: (MyChart) Due to this being a telephonic visit, the after visit summary with patients personalized plan was offered to patient via MyChart   Nurse Notes:

## 2024-04-23 NOTE — Patient Instructions (Signed)
 Ms. Carla Spencer , Thank you for taking time to come for your Medicare Wellness Visit. I appreciate your ongoing commitment to your health goals. Please review the following plan we discussed and let me know if I can assist you in the future.   Screening recommendations/referrals: Colonoscopy: no longer required Mammogram: up to date Bone Density: Education provided Recommended yearly ophthalmology/optometry visit for glaucoma screening and checkup Recommended yearly dental visit for hygiene and checkup  Vaccinations: Influenza vaccine: up to date Pneumococcal vaccine: up to date Tdap vaccine: up to date Shingles vaccine: up to date      Preventive Care 65 Years and Older, Female Preventive care refers to lifestyle choices and visits with your health care provider that can promote health and wellness. What does preventive care include? A yearly physical exam. This is also called an annual well check. Dental exams once or twice a year. Routine eye exams. Ask your health care provider how often you should have your eyes checked. Personal lifestyle choices, including: Daily care of your teeth and gums. Regular physical activity. Eating a healthy diet. Avoiding tobacco and drug use. Limiting alcohol use. Practicing safe sex. Taking low-dose aspirin  every day. Taking vitamin and mineral supplements as recommended by your health care provider. What happens during an annual well check? The services and screenings done by your health care provider during your annual well check will depend on your age, overall health, lifestyle risk factors, and family history of disease. Counseling  Your health care provider may ask you questions about your: Alcohol use. Tobacco use. Drug use. Emotional well-being. Home and relationship well-being. Sexual activity. Eating habits. History of falls. Memory and ability to understand (cognition). Work and work Astronomer. Reproductive health. Screening   You may have the following tests or measurements: Height, weight, and BMI. Blood pressure. Lipid and cholesterol levels. These may be checked every 5 years, or more frequently if you are over 74 years old. Skin check. Lung cancer screening. You may have this screening every year starting at age 11 if you have a 30-pack-year history of smoking and currently smoke or have quit within the past 15 years. Fecal occult blood test (FOBT) of the stool. You may have this test every year starting at age 22. Flexible sigmoidoscopy or colonoscopy. You may have a sigmoidoscopy every 5 years or a colonoscopy every 10 years starting at age 2. Hepatitis C blood test. Hepatitis B blood test. Sexually transmitted disease (STD) testing. Diabetes screening. This is done by checking your blood sugar (glucose) after you have not eaten for a while (fasting). You may have this done every 1-3 years. Bone density scan. This is done to screen for osteoporosis. You may have this done starting at age 67. Mammogram. This may be done every 1-2 years. Talk to your health care provider about how often you should have regular mammograms. Talk with your health care provider about your test results, treatment options, and if necessary, the need for more tests. Vaccines  Your health care provider may recommend certain vaccines, such as: Influenza vaccine. This is recommended every year. Tetanus, diphtheria, and acellular pertussis (Tdap, Td) vaccine. You may need a Td booster every 10 years. Zoster vaccine. You may need this after age 8. Pneumococcal 13-valent conjugate (PCV13) vaccine. One dose is recommended after age 21. Pneumococcal polysaccharide (PPSV23) vaccine. One dose is recommended after age 59. Talk to your health care provider about which screenings and vaccines you need and how often you need them. This information  is not intended to replace advice given to you by your health care provider. Make sure you discuss  any questions you have with your health care provider. Document Released: 08/27/2015 Document Revised: 04/19/2016 Document Reviewed: 06/01/2015 Elsevier Interactive Patient Education  2017 [employer].  Fall Prevention in the Home Falls can cause injuries. They can happen to people of all ages. There are many things you can do to make your home safe and to help prevent falls. What can I do on the outside of my home? Regularly fix the edges of walkways and driveways and fix any cracks. Remove anything that might make you trip as you walk through a door, such as a raised step or threshold. Trim any bushes or trees on the path to your home. Use bright outdoor lighting. Clear any walking paths of anything that might make someone trip, such as rocks or tools. Regularly check to see if handrails are loose or broken. Make sure that both sides of any steps have handrails. Any raised decks and porches should have guardrails on the edges. Have any leaves, snow, or ice cleared regularly. Use sand or salt on walking paths during winter. Clean up any spills in your garage right away. This includes oil or grease spills. What can I do in the bathroom? Use night lights. Install grab bars by the toilet and in the tub and shower. Do not use towel bars as grab bars. Use non-skid mats or decals in the tub or shower. If you need to sit down in the shower, use a plastic, non-slip stool. Keep the floor dry. Clean up any water that spills on the floor as soon as it happens. Remove soap buildup in the tub or shower regularly. Attach bath mats securely with double-sided non-slip rug tape. Do not have throw rugs and other things on the floor that can make you trip. What can I do in the bedroom? Use night lights. Make sure that you have a light by your bed that is easy to reach. Do not use any sheets or blankets that are too big for your bed. They should not hang down onto the floor. Have a firm chair that has  side arms. You can use this for support while you get dressed. Do not have throw rugs and other things on the floor that can make you trip. What can I do in the kitchen? Clean up any spills right away. Avoid walking on wet floors. Keep items that you use a lot in easy-to-reach places. If you need to reach something above you, use a strong step stool that has a grab bar. Keep electrical cords out of the way. Do not use floor polish or wax that makes floors slippery. If you must use wax, use non-skid floor wax. Do not have throw rugs and other things on the floor that can make you trip. What can I do with my stairs? Do not leave any items on the stairs. Make sure that there are handrails on both sides of the stairs and use them. Fix handrails that are broken or loose. Make sure that handrails are as long as the stairways. Check any carpeting to make sure that it is firmly attached to the stairs. Fix any carpet that is loose or worn. Avoid having throw rugs at the top or bottom of the stairs. If you do have throw rugs, attach them to the floor with carpet tape. Make sure that you have a light switch at the top of  the stairs and the bottom of the stairs. If you do not have them, ask someone to add them for you. What else can I do to help prevent falls? Wear shoes that: Do not have high heels. Have rubber bottoms. Are comfortable and fit you well. Are closed at the toe. Do not wear sandals. If you use a stepladder: Make sure that it is fully opened. Do not climb a closed stepladder. Make sure that both sides of the stepladder are locked into place. Ask someone to hold it for you, if possible. Clearly mark and make sure that you can see: Any grab bars or handrails. First and last steps. Where the edge of each step is. Use tools that help you move around (mobility aids) if they are needed. These include: Canes. Walkers. Scooters. Crutches. Turn on the lights when you go into a dark area.  Replace any light bulbs as soon as they burn out. Set up your furniture so you have a clear path. Avoid moving your furniture around. If any of your floors are uneven, fix them. If there are any pets around you, be aware of where they are. Review your medicines with your doctor. Some medicines can make you feel dizzy. This can increase your chance of falling. Ask your doctor what other things that you can do to help prevent falls. This information is not intended to replace advice given to you by your health care provider. Make sure you discuss any questions you have with your health care provider. Document Released: 05/27/2009 Document Revised: 01/06/2016 Document Reviewed: 09/04/2014 Elsevier Interactive Patient Education  2017 [employer].

## 2024-05-26 ENCOUNTER — Ambulatory Visit (INDEPENDENT_AMBULATORY_CARE_PROVIDER_SITE_OTHER)

## 2024-05-26 DIAGNOSIS — Z23 Encounter for immunization: Secondary | ICD-10-CM | POA: Diagnosis not present

## 2024-06-29 ENCOUNTER — Other Ambulatory Visit: Payer: Self-pay | Admitting: Family Medicine

## 2024-07-05 ENCOUNTER — Other Ambulatory Visit: Payer: Self-pay | Admitting: Family Medicine

## 2024-07-07 NOTE — Telephone Encounter (Signed)
 Requesting: lorazepam  Contract: N/A UDS: N/A  Last Visit: 04/17/24 Next Visit: 07/18/24 Last Refill: 01/11/24 (90,1)  Please Advise. Rx pending

## 2024-07-14 DIAGNOSIS — C44629 Squamous cell carcinoma of skin of left upper limb, including shoulder: Secondary | ICD-10-CM | POA: Diagnosis not present

## 2024-07-14 DIAGNOSIS — L821 Other seborrheic keratosis: Secondary | ICD-10-CM | POA: Diagnosis not present

## 2024-07-14 DIAGNOSIS — Z85828 Personal history of other malignant neoplasm of skin: Secondary | ICD-10-CM | POA: Diagnosis not present

## 2024-07-14 DIAGNOSIS — C44712 Basal cell carcinoma of skin of right lower limb, including hip: Secondary | ICD-10-CM | POA: Diagnosis not present

## 2024-07-14 DIAGNOSIS — D485 Neoplasm of uncertain behavior of skin: Secondary | ICD-10-CM | POA: Diagnosis not present

## 2024-07-14 DIAGNOSIS — L814 Other melanin hyperpigmentation: Secondary | ICD-10-CM | POA: Diagnosis not present

## 2024-07-18 ENCOUNTER — Encounter: Payer: Self-pay | Admitting: Family Medicine

## 2024-07-18 ENCOUNTER — Ambulatory Visit (INDEPENDENT_AMBULATORY_CARE_PROVIDER_SITE_OTHER): Admitting: Family Medicine

## 2024-07-18 VITALS — BP 136/73 | HR 72 | Temp 99.0°F | Ht 62.0 in | Wt 131.4 lb

## 2024-07-18 DIAGNOSIS — F3341 Major depressive disorder, recurrent, in partial remission: Secondary | ICD-10-CM

## 2024-07-18 DIAGNOSIS — F411 Generalized anxiety disorder: Secondary | ICD-10-CM | POA: Diagnosis not present

## 2024-07-18 DIAGNOSIS — Z79899 Other long term (current) drug therapy: Secondary | ICD-10-CM | POA: Diagnosis not present

## 2024-07-18 DIAGNOSIS — I10 Essential (primary) hypertension: Secondary | ICD-10-CM

## 2024-07-18 DIAGNOSIS — F4310 Post-traumatic stress disorder, unspecified: Secondary | ICD-10-CM

## 2024-07-18 MED ORDER — DULOXETINE HCL 20 MG PO CPEP: 40.0000 mg | ORAL_CAPSULE | Freq: Every day | ORAL | 3 refills | Status: AC

## 2024-07-18 MED ORDER — ATORVASTATIN CALCIUM 20 MG PO TABS: 20.0000 mg | ORAL_TABLET | Freq: Every day | ORAL | 3 refills | Status: AC

## 2024-07-18 NOTE — Progress Notes (Signed)
 OFFICE VISIT  07/18/2024  CC:  Chief Complaint  Patient presents with   Medical Management of Chronic Issues    Patient is a 83 y.o. female who presents for 85-month follow-up depression, anxiety, and PTSD.  She has significant insomnia related to the above diagnoses. A/P as of last visit: #1 hypertension, well-controlled on amlodipine  10 mg a day. Basic metabolic panel today.   #2 recurrent major depressive disorder, PTSD, GAD. Doing reasonably well lately.  Encouraged her to take her medication regularly---> duloxetine  40 mg a day, buspirone  10 mg 3 times a day, lorazepam  1 mg daily, prazosin  1 mg nightly, and risperidone  1 mg nightly.   #3 hypothyroidism, currently taking 50 mcg levothyroxine  daily. Monitor TSH today.   4.  Vitamin D  deficiency. About 6 months ago we increased her vitamin D  to 50K units twice per week.  It is not clear that she is taking this. Recheck vitamin D  level at next follow-up in 3 months.   #5 Hypercholesterolemia: Fairly well-controlled on atorvastatin  20 mg a day. LDL was 105 about 3 months ago.  I did not change her dose. Recheck cholesterol in 6 months and if LDL not down under 100 will increase to 40 mg daily.  INTERIM HX: Carla Spencer is doing well. She is sleeping well. Anxiety levels and mood stable.  She has no acute concerns.  PMP AWARE reviewed today: most recent rx for lorazepam  was filled 07/07/24, # 30, rx by me. No red flags.  Past Medical History:  Diagnosis Date   Acquired hypothyroidism    Anxiety and depression    with PTSD.  Dr. Delford ordered repeat MRI brain 2017 (no acute, no change from prior MR done in 2012) and referred pt to neuro for neuropsych testing but pt did not go.  Repeat MRI 10/2017 mild chronic microvasc isch change --stable compared to prior MRI.   Arthritis    Bilateral carpal tunnel syndrome    Cataracts, bilateral    Chronic renal insufficiency, stage III (moderate)    Colitis    hx of   DDD (degenerative  disc disease), lumbar 2016   L1-2 and L5-S1 fairly severe--intramuscular injection of steroid and toradol  by Dr. Ibazibo 02/24/14 to calm down the pain   Enterocele 05/2016   Dx'd by Dr. Winfred (GYN); he referred pt back to the MD at [facility] that did her prior bladder sling procedure.  No rectocele.   GERD (gastroesophageal reflux disease)    Goiter    Lobectomy (left) of thyroid  for benign nodule.  Right lobe nodules followed by Dr. Tommas (FNA 2012 benign goiter) with ultrasounds and have been stable, most recently 01/10/13.     H/O adenomatous polyp of colon 2012; 05/2016   5 yr recall   Hypercalcemia 10/2017   suspected to be due to lithium  toxicity.   Hyperlipidemia    Hypertension    Lithium  toxicity 2019   Lumbar spondylolysis 2016   L5: with grade I spondylolisthesis L5 on S1   Osteoporosis 2012; 2018   2012-penia.  2018 porosis--alendronate  started 12/2016.  Repeat DEXA 12/2018. DEXA 08/2021 T score -3.3   PFO (patent foramen ovale) echo 03/21/05   hole in heart saw Dr. Norleen Kling [phone] [facility].  Plavix  med mgmt.  Dr. Delford repeated echo w/bubble study 05/2016 and it showed NO PFO or ASD.   TIA (transient ischemic attack)    patient on plavix ,  saw Dr. Kling at [facility]hospital in [place].  MRI brain 05/2016: mild  chronic small vessel dz, o/w normal.   Urinary tract infection    hx of   Vertigo    Vitamin D  deficiency     Past Surgical History:  Procedure Laterality Date   ABDOMINAL HYSTERECTOMY     BLADDER SUSPENSION     BREAST EXCISIONAL BIOPSY Right 1960   BREAST EXCISIONAL BIOPSY Left    BREAST SURGERY     breast lumps removed   COLONOSCOPY W/ POLYPECTOMY  04/2011; 05/29/16   +Diverticulosis and int hem.  Adenomatous polyp 2012.  No polyps on repeat TCS 05/2016--recall 5 yrs (05/2021)   DEXA  05/23/2011   2012 T-score -2.2. 12/2016 T-score -2.6.  08/2021 T score -3.3   DILATION AND CURETTAGE OF UTERUS     diverticulosis  05/2016    Noted on colonoscopy   RECTOCELE REPAIR     purse string   THYROIDECTOMY     Left lobectomy   TOTAL KNEE ARTHROPLASTY  07/26/2012   Procedure: TOTAL KNEE ARTHROPLASTY;  Surgeon: LELON JONETTA Shari Mickey., MD;  Location: [facility] OR;  Service: Orthopedics;  Laterality: Right;   TRANSTHORACIC ECHOCARDIOGRAM  05/24/2016   EF 60-65%, normal LV function.  No DD. No PFO or ASD (bubble study WAS done).    Outpatient Medications Prior to Visit  Medication Sig Dispense Refill   alendronate  (FOSAMAX ) 70 MG tablet Take 1 tablet (70 mg total) by mouth once a week. 12 tablet 3   amLODipine  (NORVASC ) 10 MG tablet Take 1 tablet (10 mg total) by mouth daily. 90 tablet 3   aspirin  EC 81 MG tablet Take 1 tablet (81 mg total) by mouth daily. 30 tablet 0   atorvastatin  (LIPITOR) 20 MG tablet Take 1 tablet by mouth once daily 30 tablet 0   busPIRone  (BUSPAR ) 10 MG tablet Take 1 tablet (10 mg total) by mouth 3 (three) times daily. (Patient taking differently: Take 10 mg by mouth 2 (two) times daily.) 270 tablet 1   Cholecalciferol  (VITAMIN D -3 PO) Take by mouth daily.     DULoxetine  (CYMBALTA ) 20 MG capsule Take 2 capsules by mouth once daily 60 capsule 0   levothyroxine  (SYNTHROID ) 50 MCG tablet Take 1 tablet (50 mcg total) by mouth daily before breakfast. 90 tablet 3   LORazepam  (ATIVAN ) 1 MG tablet Take 1 tablet by mouth once daily 90 tablet 1   Multiple Vitamin (MV-ONE) CAPS Take 1 capsule by mouth daily.     Omega-3 Fatty Acids (FISH OIL) 500 MG CAPS Take 1 capsule by mouth daily.     risperiDONE  (RISPERDAL  M-TABS) 1 MG disintegrating tablet Take 1 tablet (1 mg total) by mouth at bedtime. 90 tablet 1   prazosin  (MINIPRESS ) 1 MG capsule Take 1 capsule (1 mg total) by mouth at bedtime. (Patient not taking: Reported on 07/18/2024) 90 capsule 3   Vitamin D , Ergocalciferol , (DRISDOL ) 1.25 MG (50000 UNIT) CAPS capsule Take 1 capsule (50,000 Units total) by mouth 2 (two) times a week. (Patient not taking: Reported on 07/18/2024) 24  capsule 1   No facility-administered medications prior to visit.    Allergies  Allergen Reactions   Lamictal  [Lamotrigine ] Other (See Comments)   Lithium  Other (See Comments)    toxicity   Phenylephrine  Rash    Patient had allergic reaction to dilation combo drop. Please dilate with Tropicamide 0.5% only with Fluress and/or Proparacaine    Sulfa Drugs Cross Reactors Rash    Review of Systems As per HPI  PE:    07/18/2024   10:11  AM 04/23/2024    1:20 PM 04/17/2024   10:21 AM  Vitals with BMI  Height 5' 2 5' 2 5' 2  Weight 131 lbs 6 oz 132 lbs 132 lbs  BMI 24.03 24.14 24.14  Systolic 136  108  Diastolic 73  65  Pulse 72  78     Physical Exam  Gen: Alert, well appearing.  Patient is oriented to person, place, time, and situation. AFFECT: pleasant, lucid thought and speech. No further exam today  LABS:  Last CBC Lab Results  Component Value Date   WBC 6.3 03/17/2021   HGB 13.8 03/17/2021   HCT 42.2 03/17/2021   MCV 89.8 03/17/2021   MCH 29.3 07/28/2012   RDW 13.7 03/17/2021   PLT 207.0 03/17/2021   Last metabolic panel Lab Results  Component Value Date   GLUCOSE 88 04/17/2024   NA 141 04/17/2024   K 4.6 04/17/2024   CL 105 04/17/2024   CO2 27 04/17/2024   BUN 17 04/17/2024   CREATININE 0.97 04/17/2024   GFR 55.10 (L) 04/17/2024   CALCIUM  10.5 04/17/2024   PHOS 2.9 07/02/2015   PROT 6.6 10/31/2023   ALBUMIN 4.5 10/31/2023   BILITOT 0.9 10/31/2023   ALKPHOS 97 10/31/2023   AST 20 10/31/2023   ALT 20 10/31/2023   Last lipids Lab Results  Component Value Date   CHOL 190 10/31/2023   HDL 64.40 10/31/2023   LDLCALC 105 (H) 10/31/2023   TRIG 103.0 10/31/2023   CHOLHDL 3 10/31/2023   Last thyroid  functions Lab Results  Component Value Date   TSH 1.91 04/17/2024   T3TOTAL 94 10/22/2017   FREET4 1.24 10/22/2017   Last vitamin D  Lab Results  Component Value Date   VD25OH 28.32 (L) 10/31/2023   Last vitamin B12 and Folate Lab Results   Component Value Date   VITAMINB12 361 05/06/2012   IMPRESSION AND PLAN:  #1 hypertension, well-controlled on amlodipine  10 mg a day.   #2 recurrent major depressive disorder, PTSD, GAD. Doing reasonably well lately.  Encouraged her to take her medication regularly---> duloxetine  40 mg a day, buspirone  10 mg 3 times a day, lorazepam  1 mg daily, and risperidone  1 mg nightly. It does not appear that she is taking her prazosin  anymore.   #3 Hypercholesterolemia: Fairly well-controlled on atorvastatin  20 mg a day. LDL was 105 about 9 months ago.  I did not change her dose. Recheck cholesterol in 3 months and if LDL not down under 100 will increase to 40 mg daily.  An After Visit Summary was printed and given to the patient.  FOLLOW UP: Return in about 3 months (around 10/16/2024) for routine chronic illness f/u.  Signed:  Gerlene Hockey, MD           07/18/2024

## 2024-07-31 ENCOUNTER — Other Ambulatory Visit: Payer: Self-pay | Admitting: Family Medicine

## 2024-10-16 ENCOUNTER — Ambulatory Visit: Admitting: Family Medicine

## 2025-04-29 ENCOUNTER — Encounter
